# Patient Record
Sex: Male | Born: 1935 | Race: Black or African American | Hispanic: No | Smoking: Current every day smoker
Health system: Southern US, Community
[De-identification: ages and names within clinical notes are randomized; demographics above are authoritative.]

## PROBLEM LIST (undated history)

## (undated) DIAGNOSIS — I10 Essential (primary) hypertension: Secondary | ICD-10-CM

## (undated) DIAGNOSIS — D649 Anemia, unspecified: Secondary | ICD-10-CM

## (undated) DIAGNOSIS — E7800 Pure hypercholesterolemia, unspecified: Secondary | ICD-10-CM

## (undated) DIAGNOSIS — I509 Heart failure, unspecified: Secondary | ICD-10-CM

## (undated) DIAGNOSIS — N400 Enlarged prostate without lower urinary tract symptoms: Secondary | ICD-10-CM

## (undated) DIAGNOSIS — J449 Chronic obstructive pulmonary disease, unspecified: Secondary | ICD-10-CM

## (undated) HISTORY — PX: OTHER SURGICAL HISTORY: SHX169

## (undated) HISTORY — DX: Heart failure, unspecified: I50.9

---

## 2006-05-27 ENCOUNTER — Ambulatory Visit: Payer: Self-pay | Admitting: Surgery

## 2006-08-31 ENCOUNTER — Ambulatory Visit: Payer: Self-pay | Admitting: Ophthalmology

## 2006-08-31 ENCOUNTER — Other Ambulatory Visit: Payer: Self-pay

## 2013-10-05 ENCOUNTER — Inpatient Hospital Stay: Payer: Self-pay | Admitting: Internal Medicine

## 2013-10-05 LAB — CK TOTAL AND CKMB (NOT AT ARMC)
CK, TOTAL: 85 U/L
CK-MB: 0.9 ng/mL (ref 0.5–3.6)

## 2013-10-05 LAB — URINALYSIS, COMPLETE
Bilirubin,UR: NEGATIVE
Glucose,UR: NEGATIVE mg/dL (ref 0–75)
NITRITE: POSITIVE
Ph: 5 (ref 4.5–8.0)
RBC,UR: 12 /HPF (ref 0–5)
SPECIFIC GRAVITY: 1.017 (ref 1.003–1.030)
Squamous Epithelial: <1
WBC UR: 616 /HPF (ref 0–5)

## 2013-10-05 LAB — RETICULOCYTES
Absolute Retic Count: 0.0727 10*6/uL (ref 0.019–0.186)
Reticulocyte: 1.82 % (ref 0.4–3.1)

## 2013-10-05 LAB — CBC WITH DIFFERENTIAL/PLATELET
BASOS ABS: 0.1 10*3/uL (ref 0.0–0.1)
Basophil %: 0.3 %
EOS PCT: 0 %
Eosinophil #: 0 10*3/uL (ref 0.0–0.7)
HCT: 29.8 % — ABNORMAL LOW (ref 40.0–52.0)
HGB: 9.2 g/dL — AB (ref 13.0–18.0)
Lymphocyte #: 1.9 10*3/uL (ref 1.0–3.6)
Lymphocyte %: 9.7 %
MCH: 22.9 pg — ABNORMAL LOW (ref 26.0–34.0)
MCHC: 30.8 g/dL — ABNORMAL LOW (ref 32.0–36.0)
MCV: 74 fL — ABNORMAL LOW (ref 80–100)
Monocyte #: 0.9 x10 3/mm (ref 0.2–1.0)
Monocyte %: 4.4 %
Neutrophil #: 17.1 10*3/uL — ABNORMAL HIGH (ref 1.4–6.5)
Neutrophil %: 85.6 %
Platelet: 221 10*3/uL (ref 150–440)
RBC: 4.01 10*6/uL — ABNORMAL LOW (ref 4.40–5.90)
RDW: 24.1 % — AB (ref 11.5–14.5)
WBC: 20 10*3/uL — ABNORMAL HIGH (ref 3.8–10.6)

## 2013-10-05 LAB — COMPREHENSIVE METABOLIC PANEL
ALBUMIN: 3.1 g/dL — AB (ref 3.4–5.0)
Alkaline Phosphatase: 104 U/L
Anion Gap: 10 (ref 7–16)
BILIRUBIN TOTAL: 0.4 mg/dL (ref 0.2–1.0)
BUN: 10 mg/dL (ref 7–18)
CALCIUM: 8.8 mg/dL (ref 8.5–10.1)
CO2: 24 mmol/L (ref 21–32)
Chloride: 105 mmol/L (ref 98–107)
Creatinine: 0.82 mg/dL (ref 0.60–1.30)
EGFR (Non-African Amer.): >60
GLUCOSE: 162 mg/dL — AB (ref 65–99)
Osmolality: 280 (ref 275–301)
POTASSIUM: 3.4 mmol/L — AB (ref 3.5–5.1)
SGOT(AST): 21 U/L (ref 15–37)
SGPT (ALT): 15 U/L
Sodium: 139 mmol/L (ref 136–145)
Total Protein: 7.8 g/dL (ref 6.4–8.2)

## 2013-10-05 LAB — IRON AND TIBC
Iron Bind.Cap.(Total): 302 ug/dL (ref 250–450)
Iron Saturation: 5 %
Iron: 15 ug/dL — ABNORMAL LOW (ref 65–175)
Unbound Iron-Bind.Cap.: 287 ug/dL

## 2013-10-05 LAB — TROPONIN I: Troponin-I: <0.02 ng/mL

## 2013-10-05 LAB — FERRITIN: Ferritin (ARMC): 66 ng/mL (ref 8–388)

## 2013-10-05 LAB — PROTIME-INR
INR: 1
Prothrombin Time: 13.1 secs (ref 11.5–14.7)

## 2013-10-06 LAB — CBC WITH DIFFERENTIAL/PLATELET
BASOS PCT: 0.2 %
Basophil #: 0 10*3/uL (ref 0.0–0.1)
EOS ABS: 0 10*3/uL (ref 0.0–0.7)
Eosinophil %: 0.1 %
HCT: 27 % — AB (ref 40.0–52.0)
HGB: 8.4 g/dL — ABNORMAL LOW (ref 13.0–18.0)
LYMPHS ABS: 1.3 10*3/uL (ref 1.0–3.6)
Lymphocyte %: 6.5 %
MCH: 23.2 pg — AB (ref 26.0–34.0)
MCHC: 31.1 g/dL — AB (ref 32.0–36.0)
MCV: 75 fL — ABNORMAL LOW (ref 80–100)
MONOS PCT: 5.5 %
Monocyte #: 1.1 x10 3/mm — ABNORMAL HIGH (ref 0.2–1.0)
Neutrophil #: 17.7 10*3/uL — ABNORMAL HIGH (ref 1.4–6.5)
Neutrophil %: 87.7 %
Platelet: 220 10*3/uL (ref 150–440)
RBC: 3.62 10*6/uL — AB (ref 4.40–5.90)
RDW: 22.7 % — AB (ref 11.5–14.5)
WBC: 20.2 10*3/uL — AB (ref 3.8–10.6)

## 2013-10-06 LAB — PSA: PSA: <0.1 ng/mL (ref 0.0–4.0)

## 2013-10-07 LAB — CBC WITH DIFFERENTIAL/PLATELET
Basophil #: 0.1 10*3/uL (ref 0.0–0.1)
Basophil %: 0.4 %
Eosinophil #: 0 10*3/uL (ref 0.0–0.7)
Eosinophil %: 0.1 %
HCT: 27.1 % — ABNORMAL LOW (ref 40.0–52.0)
HGB: 8.5 g/dL — ABNORMAL LOW (ref 13.0–18.0)
Lymphocyte #: 1.9 10*3/uL (ref 1.0–3.6)
Lymphocyte %: 11.8 %
MCH: 23.2 pg — ABNORMAL LOW (ref 26.0–34.0)
MCHC: 31.4 g/dL — ABNORMAL LOW (ref 32.0–36.0)
MCV: 74 fL — AB (ref 80–100)
MONO ABS: 1.1 x10 3/mm — AB (ref 0.2–1.0)
MONOS PCT: 6.9 %
NEUTROS ABS: 13 10*3/uL — AB (ref 1.4–6.5)
Neutrophil %: 80.8 %
Platelet: 224 10*3/uL (ref 150–440)
RBC: 3.66 10*6/uL — ABNORMAL LOW (ref 4.40–5.90)
RDW: 23 % — ABNORMAL HIGH (ref 11.5–14.5)
WBC: 16.1 10*3/uL — AB (ref 3.8–10.6)

## 2013-10-07 LAB — URINE CULTURE

## 2013-10-10 LAB — CULTURE, BLOOD (SINGLE)

## 2014-04-07 ENCOUNTER — Emergency Department: Payer: Self-pay | Admitting: Emergency Medicine

## 2014-06-16 NOTE — Discharge Summary (Signed)
PATIENT NAME:  Douglas Mathis, Douglas Mathis MR#:  [id number] DATE OF BIRTH:  1935/10/28  DATE OF ADMISSION:  10/05/2013 DATE OF DISCHARGE:  10/07/2013  ADMISSION DIAGNOSES:  1.  Sepsis. 2.  Urinary tract infection discharge.  3.  Sepsis from urinary tract infection, resolved.  4.  Escherichia coli urinary tract infection.  5.  Iron-deficiency anemia.  6.  Conjunctivitis.   PERTINENT LABORATORIES AT DISCHARGE: White blood cells 16, hemoglobin 8.5, hematocrit 28 and platelets are 224,000.   Urine culture was positive for Escherichia coli.   Blood cultures -8.   CT of the head showed no acute intracranial hemorrhage or CVA.   TIBC 302, iron saturation 5, serum iron is 15.   HOSPITAL COURSE: A 79 year old male who presented actually with dizziness 10/05/2013, found to have sepsis and UTI. For further details, please refer to H and P.   1.  Sepsis.  The patient initially presented with fevers and leukocytosis secondary to sepsis from urinary tract infection. He was treated with antibiotics.  His urinary tract infection is outlined below.  2.  Urinary tract infection. This is positive for urine culture for Escherichia coli. He was started on Rocephin and will be discharged with p.o. antibiotics. He will need a complete course of 14 days of antibiotics.  3.  Iron-deficiency anemia. The patient will need outpatient followup.  4. Conjunctivitis of his left side. The patient was placed on eyedrops and has a followup next week with his ophthalmologist.  5. Tobacco dependence. The patient was counseled for 4 minutes regarding stopping smoking. He does want a nicotine patch at discharge.   DISCHARGE MEDICATIONS: 1.  Clonidine 0.3 mg b.i.d.  2.  Metoprolol 200 mg daily.  3.  Norvasc/benazepril 10/20 daily.  4.  Nicotine patch 7 mg per 24 hours.  5.  Ciprofloxacin eye drops 2 drops affected eye x6 days.  6.  Ciprofloxacin 500 mg p.o. b.i.d. x10 days.   DISCHARGE INSTRUCTIONS:  Home health, physical  therapy, nurse and nurse aide.   DISCHARGE DIET: Low sodium.  ACTIVITY:  As tolerated.   FOLLOWUP:  The patient will follow up with his primary care physician in 1 week as well as his ophthalmologist.   TIME SPENT: 35 minutes.    ____________________________ Janyth Contes. Juliene Pina, MD spm:DT D: 10/07/2013 11:20:01 ET T: 10/07/2013 15:32:17 ET JOB#: [id number]  cc: Kalina Morabito P. Juliene Pina, MD, <Dictator> Phineas Real [facility] Asa Fath P Megumi Treaster MD ELECTRONICALLY SIGNED 10/07/2013 21:23

## 2014-06-16 NOTE — H&P (Signed)
PATIENT NAME:  Douglas Mathis, Douglas Mathis MR#:  [id number] DATE OF BIRTH:  1935/12/13  DATE OF ADMISSION:  10/05/2013  REFERRING PHYSICIAN: Dr. Bayard Males  PRIMARY CARE PHYSICIAN: Nonlocal ([facility])   ADMIT DIAGNOSES: Sepsis, urinary tract infection, acute conjunctivitis.  HISTORY OF PRESENT ILLNESS: This is a 79 year old African American male who presents to the Emergency Department because of dizziness. The patient denies falling or having any loss of consciousness. He denies nausea, diaphoresis, chest pain or shortness of breath when he became dizzy. This is the first time he has had this sensation and it occurred as he went to the restroom in the middle of the night to empty his bladder. He states that this is his usual habit and that he has not had increased frequency or urgency to urinate. He denies having difficulty initiating his urinary stream, although admits that it occasionally happens. He denies having to strain in order to urinate. He has been moving his bowels regularly and denies feeling feverish.   REVIEW OF SYSTEMS: CONSTITUTIONAL: Denies fever or fatigue.  EYES: The patient complains of itching, pain and swelling of his left eye. ENT: The patient denies ear pain or sore throat.  RESPIRATORY: The patient denies cough, wheezing or shortness of breath.  CARDIOVASCULAR: The patient denies chest pain or palpitations. GASTROINTESTINAL: The patient denies nausea, vomiting and diarrhea.  GENITOURINARY: The patient denies dysuria, frequency, or hesitancy. He also denies incontinence.  ENDOCRINE: The patient denies polyuria, but admits to nocturia once per night.  HEMATOLOGIC AND LYMPHATIC: The patient denies easy bruising or any bleeding.  INTEGUMENTARY: The patient denies rash or lesions.  MUSCULOSKELETAL: The patient denies arthralgias or myalgias.  NEUROLOGIC: The patient denies numbness or weakness. He admits to dizziness, but denies vertigo or disequilibrium.  PSYCHIATRIC: The  patient denies suicidal ideation or anxiety.   PAST MEDICAL HISTORY: Significant for hypertension.  PAST SURGICAL HISTORY: The patient has had right eye cataract removal.  SOCIAL HISTORY: The patient lives alone. He denies drug and alcohol use, but admits to smoking 1/2 pack of cigarettes per day for at least 30 years.   MEDICATIONS:  1.  Metoprolol succinate 200 mg 1 tab p.o. daily. 2.  Clonidine 0.3 mg 1 tab p.o. b.i.d.  3.  Amlodipine/benazepril 10 mg/20 mg combination 1 tab p.o. daily.   ALLERGIES: No known drug allergies.   PERTINENT LABORATORY RESULTS AND RADIOLOGIC FINDINGS: Hemoglobin 9.2, hematocrit 29.8, white blood cell count 20, MCV 74. Potassium 3.4.   Chest x-ray shows hyperinflation consistent with COPD.  CT of the head shows no acute abnormality.   PHYSICAL EXAMINATION: VITAL SIGNS: Temperature 98.2, pulse 96, respirations 24, blood pressure 173/63.  GENERAL: The patient is alert and oriented x3, in no apparent distress.  HEENT: Normocephalic, atraumatic. Extraocular movements intact. Pupils equal, round and reactive to light. Left sclera with chemosis. There is no proptosis or pain with eye-movement. Oropharynx is without exudate or erythema. Mucous membranes are moist.  NECK: Trachea is midline. No adenopathy.  CHEST: Symmetric and atraumatic.  CARDIOVASCULAR: Regular rate and rhythm. Normal S1, S2. No rubs, clicks or murmurs. LUNGS: Clear to auscultation bilaterally. Normal effort and excursion.  ABDOMEN: Positive bowel sounds, soft, nontender, nondistended. No hepatosplenomegaly.  GENITOURINARY: The patient is uncircumcised.  MUSCULOSKELETAL: The patient moves all 4 extremities equally.  SKIN: No rashes or lesions.  EXTREMITIES: No clubbing, cyanosis or edema.  NEUROLOGIC: Cranial nerves II through XII are grossly intact.  PSYCHIATRIC: Mood is normal. Affect is congruent.   ASSESSMENT  AND PLAN: This is a 79 year old male with sepsis likely secondary to urinary  tract infection. He also has an acute conjunctivitis.  1.  Sepsis. The patient is currently afebrile. He is nontoxic appearing. His complaints of dizziness or lightheadedness may have been due to some orthostasis, but the patient is hemodynamically stable now. We will obtain blood cultures and urine cultures and start the patient on antibiotics.  2.  Urinary tract infection, complicated. The patient has received a dose of ceftriaxone in the Emergency Department. We will continue ceftriaxone and vancomycin until blood cultures are clear. He states he has not had a urinary tract infection in the past and the presenting scenario gives rise for concern for possible urinary retention and hesitancy due to prostatic hypertrophy. I have ordered a prostate-specific antigen. I have deferred digital rectal at this time.  3.  Acute conjunctivitis. The patient has edema of the sclera, but no evidence of postseptal cellulitis. We will continue ciprofloxacin ophthalmic drops.  4.  Microcytic anemia. I have ordered iron studies with the patient's morning labs.  5.  Deep vein thrombosis prophylaxis. Sequential compression devices.  6.  Gastrointestinal prophylaxis. The patient is not critically ill and so this is not necessary at this time.   TIME SPENT ON ADMISSION ORDERS AND PATIENT CARE: Approximately 35 minutes. ____________________________ Kelton Pillar. Sheryle Hail, MD msd:sb D: 10/05/2013 07:00:11 ET T: 10/05/2013 09:13:48 ET JOB#: [id number]  cc: Kelton Pillar. Sheryle Hail, MD, <Dictator> Kelton Pillar Eulas Schweitzer MD ELECTRONICALLY SIGNED 10/07/2013 2:57

## 2015-07-08 ENCOUNTER — Encounter: Payer: Self-pay | Admitting: *Deleted

## 2015-07-08 ENCOUNTER — Emergency Department: Payer: Medicare Other

## 2015-07-08 ENCOUNTER — Inpatient Hospital Stay
Admission: EM | Admit: 2015-07-08 | Discharge: 2015-07-11 | DRG: 378 | Disposition: A | Discharge status: Home / Self Care | Payer: Medicare Other | Attending: Internal Medicine | Admitting: Internal Medicine

## 2015-07-08 DIAGNOSIS — R195 Other fecal abnormalities: Secondary | ICD-10-CM | POA: Diagnosis present

## 2015-07-08 DIAGNOSIS — E7800 Pure hypercholesterolemia, unspecified: Secondary | ICD-10-CM | POA: Diagnosis present

## 2015-07-08 DIAGNOSIS — D500 Iron deficiency anemia secondary to blood loss (chronic): Secondary | ICD-10-CM | POA: Diagnosis present

## 2015-07-08 DIAGNOSIS — E871 Hypo-osmolality and hyponatremia: Secondary | ICD-10-CM | POA: Diagnosis present

## 2015-07-08 DIAGNOSIS — D649 Anemia, unspecified: Secondary | ICD-10-CM

## 2015-07-08 DIAGNOSIS — E876 Hypokalemia: Secondary | ICD-10-CM | POA: Diagnosis present

## 2015-07-08 DIAGNOSIS — Z79899 Other long term (current) drug therapy: Secondary | ICD-10-CM

## 2015-07-08 DIAGNOSIS — R531 Weakness: Secondary | ICD-10-CM

## 2015-07-08 DIAGNOSIS — E785 Hyperlipidemia, unspecified: Secondary | ICD-10-CM | POA: Diagnosis present

## 2015-07-08 DIAGNOSIS — K210 Gastro-esophageal reflux disease with esophagitis: Secondary | ICD-10-CM | POA: Diagnosis present

## 2015-07-08 DIAGNOSIS — K259 Gastric ulcer, unspecified as acute or chronic, without hemorrhage or perforation: Secondary | ICD-10-CM | POA: Diagnosis present

## 2015-07-08 DIAGNOSIS — N400 Enlarged prostate without lower urinary tract symptoms: Secondary | ICD-10-CM | POA: Diagnosis present

## 2015-07-08 DIAGNOSIS — F17210 Nicotine dependence, cigarettes, uncomplicated: Secondary | ICD-10-CM | POA: Diagnosis present

## 2015-07-08 DIAGNOSIS — Z808 Family history of malignant neoplasm of other organs or systems: Secondary | ICD-10-CM | POA: Diagnosis not present

## 2015-07-08 DIAGNOSIS — Z7982 Long term (current) use of aspirin: Secondary | ICD-10-CM

## 2015-07-08 DIAGNOSIS — K2960 Other gastritis without bleeding: Secondary | ICD-10-CM | POA: Diagnosis present

## 2015-07-08 DIAGNOSIS — Z833 Family history of diabetes mellitus: Secondary | ICD-10-CM | POA: Diagnosis not present

## 2015-07-08 DIAGNOSIS — I10 Essential (primary) hypertension: Secondary | ICD-10-CM | POA: Diagnosis present

## 2015-07-08 DIAGNOSIS — K922 Gastrointestinal hemorrhage, unspecified: Secondary | ICD-10-CM

## 2015-07-08 DIAGNOSIS — M109 Gout, unspecified: Secondary | ICD-10-CM | POA: Diagnosis present

## 2015-07-08 DIAGNOSIS — K264 Chronic or unspecified duodenal ulcer with hemorrhage: Principal | ICD-10-CM | POA: Diagnosis present

## 2015-07-08 DIAGNOSIS — H409 Unspecified glaucoma: Secondary | ICD-10-CM | POA: Diagnosis present

## 2015-07-08 HISTORY — DX: Essential (primary) hypertension: I10

## 2015-07-08 HISTORY — DX: Pure hypercholesterolemia, unspecified: E78.00

## 2015-07-08 LAB — ABO/RH: ABO/RH(D): O POS

## 2015-07-08 LAB — HEPATIC FUNCTION PANEL
ALK PHOS: 62 U/L (ref 38–126)
ALT: 9 U/L — AB (ref 17–63)
AST: 16 U/L (ref 15–41)
Albumin: 3.2 g/dL — ABNORMAL LOW (ref 3.5–5.0)
BILIRUBIN DIRECT: 0.1 mg/dL (ref 0.1–0.5)
BILIRUBIN INDIRECT: 0.2 mg/dL — AB (ref 0.3–0.9)
TOTAL PROTEIN: 6.5 g/dL (ref 6.5–8.1)
Total Bilirubin: 0.3 mg/dL (ref 0.3–1.2)

## 2015-07-08 LAB — BASIC METABOLIC PANEL
Anion gap: 8 (ref 5–15)
BUN: 10 mg/dL (ref 6–20)
CHLORIDE: 93 mmol/L — AB (ref 101–111)
CO2: 24 mmol/L (ref 22–32)
CREATININE: 0.73 mg/dL (ref 0.61–1.24)
Calcium: 8.7 mg/dL — ABNORMAL LOW (ref 8.9–10.3)
GFR calc Af Amer: >60 mL/min (ref >60)
GFR calc non Af Amer: >60 mL/min (ref >60)
GLUCOSE: 132 mg/dL — AB (ref 65–99)
Potassium: 4.7 mmol/L (ref 3.5–5.1)
Sodium: 125 mmol/L — ABNORMAL LOW (ref 135–145)

## 2015-07-08 LAB — CBC
HCT: 17.9 % — ABNORMAL LOW (ref 40.0–52.0)
Hemoglobin: 5.4 g/dL — ABNORMAL LOW (ref 13.0–18.0)
MCH: 16.8 pg — AB (ref 26.0–34.0)
MCHC: 30.2 g/dL — ABNORMAL LOW (ref 32.0–36.0)
MCV: 55.7 fL — AB (ref 80.0–100.0)
PLATELETS: 495 10*3/uL — AB (ref 150–440)
RBC: 3.21 MIL/uL — ABNORMAL LOW (ref 4.40–5.90)
RDW: 24.3 % — AB (ref 11.5–14.5)
WBC: 13.9 10*3/uL — ABNORMAL HIGH (ref 3.8–10.6)

## 2015-07-08 LAB — PROTIME-INR
INR: 1.09
Prothrombin Time: 14.3 seconds (ref 11.4–15.0)

## 2015-07-08 LAB — PREPARE RBC (CROSSMATCH)

## 2015-07-08 LAB — ETHANOL: Alcohol, Ethyl (B): <5 mg/dL (ref <5)

## 2015-07-08 LAB — BRAIN NATRIURETIC PEPTIDE: B Natriuretic Peptide: 108 pg/mL — ABNORMAL HIGH (ref 0.0–100.0)

## 2015-07-08 LAB — HEMOGLOBIN: HEMOGLOBIN: 6.5 g/dL — AB (ref 13.0–18.0)

## 2015-07-08 LAB — TROPONIN I: Troponin I: <0.03 ng/mL (ref <0.031)

## 2015-07-08 MED ORDER — SODIUM CHLORIDE 0.9 % IV SOLN
INTRAVENOUS | Status: DC
Start: 2015-07-08 — End: 2015-07-10
  Administered 2015-07-09: 50 mL/h via INTRAVENOUS

## 2015-07-08 MED ORDER — SODIUM CHLORIDE 0.9 % IV SOLN
10.0000 mL/h | Freq: Once | INTRAVENOUS | Status: AC
  Administered 2015-07-10: 10 mL/h via INTRAVENOUS

## 2015-07-08 MED ORDER — AMLODIPINE BESYLATE 10 MG PO TABS
10.0000 mg | ORAL_TABLET | Freq: Every day | ORAL | Status: DC
  Administered 2015-07-08 – 2015-07-11 (×3): 10 mg via ORAL
  Filled 2015-07-08 (×4): qty 1

## 2015-07-08 MED ORDER — PANTOPRAZOLE SODIUM 40 MG IV SOLR
40.0000 mg | Freq: Two times a day (BID) | INTRAVENOUS | Status: DC
Start: 2015-07-08 — End: 2015-07-11
  Administered 2015-07-08 – 2015-07-10 (×4): 40 mg via INTRAVENOUS
  Filled 2015-07-08 (×5): qty 40

## 2015-07-08 MED ORDER — TAMSULOSIN HCL 0.4 MG PO CAPS
0.4000 mg | ORAL_CAPSULE | Freq: Every day | ORAL | Status: DC
  Administered 2015-07-09 – 2015-07-11 (×2): 0.4 mg via ORAL
  Filled 2015-07-08 (×3): qty 1

## 2015-07-08 MED ORDER — LATANOPROST 0.005 % OP SOLN
1.0000 [drp] | Freq: Every day | OPHTHALMIC | Status: DC
  Administered 2015-07-08 – 2015-07-10 (×3): 1 [drp] via OPHTHALMIC
  Filled 2015-07-08 (×2): qty 2.5

## 2015-07-08 MED ORDER — BENAZEPRIL HCL 20 MG PO TABS
20.0000 mg | ORAL_TABLET | Freq: Every day | ORAL | Status: DC
  Administered 2015-07-08 – 2015-07-11 (×3): 20 mg via ORAL
  Filled 2015-07-08 (×4): qty 1

## 2015-07-08 MED ORDER — AMLODIPINE BESY-BENAZEPRIL HCL 10-20 MG PO CAPS: 1.0000 | ORAL_CAPSULE | Freq: Every day | ORAL | Status: DC

## 2015-07-08 MED ORDER — COLCHICINE 0.6 MG PO TABS
0.6000 mg | ORAL_TABLET | Freq: Every day | ORAL | Status: DC
  Administered 2015-07-08 – 2015-07-11 (×3): 0.6 mg via ORAL
  Filled 2015-07-08 (×4): qty 1

## 2015-07-08 MED ORDER — SODIUM CHLORIDE 0.9 % IV SOLN
Freq: Once | INTRAVENOUS | Status: AC
  Administered 2015-07-08: 17:00:00 via INTRAVENOUS

## 2015-07-08 NOTE — ED Notes (Signed)
Pt brought in via ems from [facility] with feeling weak and congestion.  No cough.  No chest pain.  No sob.  No abd pain.  No n/v/d.  Pt alert.  Speech clear.  Pt ate today, but states i just feel weak and tired.

## 2015-07-08 NOTE — ED Notes (Signed)
Iv inflitrated on lac.  Restarted rac 20gauge.  Iv fluids infusing.  Pt alert.  nsr on monitor.

## 2015-07-08 NOTE — ED Notes (Signed)
Consent signed for blood by patient

## 2015-07-08 NOTE — ED Notes (Signed)
Pt alert.  prbc's infusing.  Family with pt

## 2015-07-08 NOTE — ED Notes (Signed)
Family with pt.   Pt alert.  Family with pt.  nsr on monitor.

## 2015-07-08 NOTE — Progress Notes (Signed)
°   07/08/15 1900  Clinical Encounter Type  Visited With Patient and family together  Visit Type Initial  Referral From Nurse  Consult/Referral To Chaplain  Request for Advance Directives.  Patient's daughter Corrie Dandy -  Works evenings in L&D at [employer]) asked that we complete AD papers when other family members are present. She will contact pastoral care when ready. Chaplain [employer] Ext [phone]

## 2015-07-08 NOTE — H&P (Signed)
[employer] [facility] [place] [facility]   PATIENT NAME: Douglas Mathis    MR#:  [id number]  DATE OF BIRTH:  [birth year]  DATE OF ADMISSION:  07/08/2015  PRIMARY CARE PHYSICIAN: No primary care provider on file.   REQUESTING/REFERRING PHYSICIAN: [place]  CHIEF COMPLAINT:   Chief Complaint  Patient presents with  . Fatigue    HISTORY OF PRESENT ILLNESS: Douglas Mathis  is a 80 y.o. male with a known history of  Htn, hypercholesterolemia- takes BC powder once every week or 2, for last 4-5 days he is not feeling much hungry and not eating good, and started feeling very weak so decided to come to emergency room. In ER he was noted to have hemoglobin less than 6 and guaiac was positive in the stool, so ER physician ordered. Of blood transfusion and called for admission to hospitalist team. Patient denies any major vomiting of blood or noticing any blood in the stool. Denies any acid reflux symptoms, he denies any epigastric pain, he denies any colonoscopy done with his primary care doctor in the last many years.  PAST MEDICAL HISTORY:   Past Medical History  Diagnosis Date  . Hypertension   . Hypercholesterolemia     PAST SURGICAL HISTORY: No past surgical history on file.  SOCIAL HISTORY:  Social History  Substance Use Topics  . Smoking status: Current Every Day Smoker -- 1.00 packs/day  . Smokeless tobacco: Not on file  . Alcohol Use: No    FAMILY HISTORY:  Family History  Problem Relation Age of Onset  . Diabetes Mother   . Bone cancer Brother     DRUG ALLERGIES: No Known Allergies  REVIEW OF SYSTEMS:   CONSTITUTIONAL: No fever,Positive for fatigue or weakness.  EYES: No blurred or double vision.  EARS, NOSE, AND THROAT: No tinnitus or ear pain.  RESPIRATORY: No cough, shortness of breath, wheezing or hemoptysis.  CARDIOVASCULAR: No chest pain, orthopnea, edema.  GASTROINTESTINAL: No nausea, vomiting, diarrhea or abdominal pain.  GENITOURINARY: No  dysuria, hematuria.  ENDOCRINE: No polyuria, nocturia,  HEMATOLOGY: No anemia, easy bruising or bleeding SKIN: No rash or lesion. MUSCULOSKELETAL: No joint pain or arthritis.   NEUROLOGIC: No tingling, numbness, weakness.  PSYCHIATRY: No anxiety or depression.   MEDICATIONS AT HOME:  Prior to Admission medications   Medication Sig Start Date End Date Taking? Authorizing Provider  amLODipine-benazepril (LOTREL) 10-20 MG capsule Take 1 capsule by mouth daily.   Yes Historical Provider, MD  Aspirin-Salicylamide-Caffeine (BC HEADACHE PO) Take 1 packet by mouth 2 (two) times daily as needed (for headaches).   Yes Historical Provider, MD  bimatoprost (LUMIGAN) 0.01 % SOLN Place 1 drop into both eyes at bedtime.   Yes Historical Provider, MD  cloNIDine (CATAPRES) 0.2 MG tablet Take 0.2 mg by mouth 2 (two) times daily.   Yes Historical Provider, MD  colchicine 0.6 MG tablet Take 0.6 mg by mouth daily.   Yes Historical Provider, MD  tamsulosin (FLOMAX) 0.4 MG CAPS capsule Take 0.4 mg by mouth daily after breakfast.    Yes Historical Provider, MD      PHYSICAL EXAMINATION:   VITAL SIGNS: Blood pressure 141/57, pulse 88, temperature 98.2 F (36.8 C), temperature source Oral, resp. rate 20, height  (1.676 m), weight 81.647 kg (180 lb), SpO2 95 %.  GENERAL:  80 y.o.-year-old patient lying in the bed with no acute distress.  EYES: Pupils equal, round, reactive to light and accommodation. No scleral icterus. Extraocular muscles intact. Conjunctiva  pale HEENT: Head atraumatic, normocephalic. Oropharynx and nasopharynx clear.  NECK:  Supple, no jugular venous distention. No thyroid enlargement, no tenderness.  LUNGS: Normal breath sounds bilaterally, no wheezing, rales,rhonchi or crepitation. No use of accessory muscles of respiration.  CARDIOVASCULAR: S1, S2 normal. No murmurs, rubs, or gallops.  ABDOMEN: Soft, nontender, nondistended. Bowel sounds present. No organomegaly or mass.  EXTREMITIES:  No pedal edema, cyanosis, or clubbing.  NEUROLOGIC: Cranial nerves II through XII are intact. Muscle strength 5/5 in all extremities. Sensation intact. Gait not checked.  PSYCHIATRIC: The patient is alert and oriented x 3.  SKIN: No obvious rash, lesion, or ulcer.   LABORATORY PANEL:   CBC  Recent Labs Lab 07/08/15 1539  WBC 13.9*  HGB 5.4*  HCT 17.9*  PLT 495*  MCV 55.7*  MCH 16.8*  MCHC 30.2*  RDW 24.3*   ------------------------------------------------------------------------------------------------------------------  Chemistries   Recent Labs Lab 07/08/15 1539  NA 125*  K 4.7  CL 93*  CO2 24  GLUCOSE 132*  BUN 10  CREATININE 0.73  CALCIUM 8.7*   ------------------------------------------------------------------------------------------------------------------ estimated creatinine clearance is 76.3 mL/min (by C-G formula based on Cr of 0.73). ------------------------------------------------------------------------------------------------------------------ No results for input(s): TSH, T4TOTAL, T3FREE, THYROIDAB in the last 72 hours.  Invalid input(s): FREET3   Coagulation profile No results for input(s): INR, PROTIME in the last 168 hours. ------------------------------------------------------------------------------------------------------------------- No results for input(s): DDIMER in the last 72 hours. -------------------------------------------------------------------------------------------------------------------  Cardiac Enzymes No results for input(s): CKMB, TROPONINI, MYOGLOBIN in the last 168 hours.  Invalid input(s): CK ------------------------------------------------------------------------------------------------------------------ Invalid input(s): POCBNP  ---------------------------------------------------------------------------------------------------------------  Urinalysis    Component Value Date/Time   COLORURINE Yellow 10/05/2013  0414   APPEARANCEUR Cloudy 10/05/2013 0414   LABSPEC 1.017 10/05/2013 0414   PHURINE 5.0 10/05/2013 0414   GLUCOSEU Negative 10/05/2013 0414   HGBUR 1+ 10/05/2013 0414   BILIRUBINUR Negative 10/05/2013 0414   KETONESUR Trace 10/05/2013 0414   PROTEINUR 30 mg/dL 56/21/3086 5784   NITRITE Positive 10/05/2013 0414   LEUKOCYTESUR 3+ 10/05/2013 0414     RADIOLOGY: Dg Chest 1 View  07/08/2015  CLINICAL DATA:  Weakness in congestion for 2 days. EXAM: CHEST 1 VIEW COMPARISON:  10/05/2013 FINDINGS: Atherosclerotic aortic arch. Heart size within normal limits for projection. Emphysema. Chronically blunted left lateral costophrenic angle with thickening of the adjacent pleura. Thoracic spondylosis. IMPRESSION: 1. Stable blunting of the left lateral costophrenic angle potentially from scarring or chronic pleural effusion. 2. Atherosclerotic aortic arch. 3. Emphysema. Electronically Signed   By: Gaylyn Rong M.D.   On: 07/08/2015 17:07    EKG: Orders placed or performed during the hospital encounter of 07/08/15  . ED EKG  . ED EKG  . EKG 12-Lead  . EKG 12-Lead    IMPRESSION AND PLAN:  * Symptomatic anemia  Due to gastrointestinal bleed   Most likely upper GI bleed- due to peptic ulcer.   He takes BC powder almost every week.    2 units of blood transfusion ordered by ER.   Follow serial hemoglobin.   Keep on clear liquid diet for now.   IV Protonix twice a day.   GI consult for further management.  * Hypertension   Continue amlodipine and benazepril.  * Gout   Continue colchicine.  * Glaucoma   Continue eye drops.  * Smoking   Counseled to quit smoking for 4 minutes and offered nicotine patch.   All the records are reviewed and case discussed with ED provider. Management plans discussed with the patient, family  and they are in agreement.  CODE STATUS: DO NOT RESUSCITATE Code Status History    This patient does not have a recorded code status. Please follow your  organizational policy for patients in this situation.     Patient's daughter who works as a Water engineer in our hospital, was present during my visit. Patient would like her to be his healthcare power of attorney and he would like to be DO NOT RESUSCITATE in any adverse event.  TOTAL TIME TAKING CARE OF THIS PATIENT: 50 minutes.    Altamese Dilling M.D on 07/08/2015   Between 7am to 6pm - Pager - [phone]  After 6pm go to [email or web address] - Social research officer, government  [facility]  Office  [phone]  CC: Primary care physician; No primary care provider on file.   Note: This dictation was prepared with Dragon dictation along with smaller phrase technology. Any transcriptional errors that result from this process are unintentional.

## 2015-07-08 NOTE — ED Notes (Signed)
prbc's infusing at 55ml/hr.  nsr on monitor.

## 2015-07-08 NOTE — Progress Notes (Signed)
New Admission Note:   Arrival Method: per bed from ED with RN Amy Mental Orientation: alert and oriented X4 Telemetry: none ordered Assessment: Completed Skin: warm, dry, intact, no wounds noted IV: G20 on the right AC, patent with transparent dressing, first unit of blood infusing, started at the ED Pain: denies any pain as of this time Breathing: on room air, not in any distress Safety Measures: Safety Fall Prevention Plan has been given and discussed Admission: Completed 1A Orientation: Patient has been oriented to the room, unit and staff.  Family: brothers and sister at the bedside  Orders have been reviewed and implemented. Will continue to monitor the patient. Call light has been placed within reach and bed alarm has been activated.   Janice Norrie BSN, RN [facility] 1A

## 2015-07-08 NOTE — Progress Notes (Signed)
Pt complained of shortness of breath, denies any chest pain. Clear on both lungs per auscultation, O2 saturation 98% on room air, On call Dr Anne Hahn paged and made aware. Ordered for O2 inhalation 1-2liters/min. PRN for shortness of breath. Will administer and continue to monitor.

## 2015-07-08 NOTE — ED Notes (Signed)
Pt informed about reactions to let nurse know.  nsr on monitor.  Skin warm and dry.  Pt alert.

## 2015-07-08 NOTE — ED Notes (Signed)
Pt brought in via ems from [facility] with congestion and feeling weak from not eating.  No chest pain  Denies sob.  Pt alert.  Speech clear.

## 2015-07-08 NOTE — ED Notes (Addendum)
Blood transfusion infusing.  Pt alert. No reaction noted.

## 2015-07-08 NOTE — ED Provider Notes (Signed)
[facility] Emergency Department Provider Note        Time seen: ----------------------------------------- 4:30 PM on 07/08/2015 -----------------------------------------    I have reviewed the triage vital signs and the nursing notes.   HISTORY  Chief Complaint Fatigue    HPI Douglas Mathis is a 80 y.o. male brought the ER from the [facility] with weakness and congestion. Patient's tympanic cough or chest pain or shortness of breath. He denies nausea vomiting or diarrhea. Patient states he just feels weak and tired. Patient denies any blood in his stool, does admit to daily alcohol and cigarette use.   No past medical history on file.  There are no active problems to display for this patient.   No past surgical history on file.  Allergies Review of patient's allergies indicates no known allergies.  Social History Social History  Substance Use Topics  . Smoking status: Current Every Day Smoker  . Smokeless tobacco: None  . Alcohol Use: No    Review of Systems Constitutional: Negative for fever. Eyes: Negative for visual changes. ENT: Negative for sore throat. Cardiovascular: Negative for chest pain. Respiratory: Positive shortness of breath Gastrointestinal: Negative for abdominal pain, vomiting and diarrhea. Genitourinary: Negative for dysuria. Musculoskeletal: Negative for back pain. Skin: Negative for rash. Neurological:Positive for weakness  10-point ROS otherwise negative.  ____________________________________________   PHYSICAL EXAM:  VITAL SIGNS: ED Triage Vitals  Enc Vitals Group     BP 07/08/15 1530 145/55 mmHg     Pulse Rate 07/08/15 1530 86     Resp 07/08/15 1533 20     Temp 07/08/15 1533 98 F (36.7 C)     Temp Source 07/08/15 1533 Oral     SpO2 07/08/15 1530 97 %     Weight 07/08/15 1533 180 lb (81.647 kg)     Height 07/08/15 1533 5\' 6"  (1.676 m)     Head Cir --      Peak Flow --      Pain Score  --      Pain Loc --      Pain Edu? --      Excl. in GC? --     Constitutional: Alert and oriented. Mild distress Eyes: Conjunctivae are pale. PERRL. Normal extraocular movements. ENT   Head: Normocephalic and atraumatic.   Nose: No congestion/rhinnorhea.   Mouth/Throat: Mucous membranes are moist.   Neck: No stridor. Cardiovascular: Normal rate, regular rhythm. No murmurs, rubs, or gallops. Respiratory: Normal respiratory effort with mild tachypnea but no retractions. Breath sounds are clear and equal bilaterally. No wheezes/rales/rhonchi. Gastrointestinal: Soft and nontender. Normal bowel sounds Rectal: Soft, heme positive stool Musculoskeletal: Nontender with normal range of motion in all extremities. No lower extremity tenderness nor edema. Neurologic:  Normal speech and language. No gross focal neurologic deficits are appreciated.  Skin:  Skin is warm, dry and intact. Pallor is noted Psychiatric: Mood and affect are normal. Speech and behavior are normal.  ____________________________________________  EKG: Interpreted by me. Sinus rhythm with a rate of 82 bpm, first-degree AV block, wide QRS, normal QT interval. Right bundle branch block pattern, possible septal infarct age indeterminate  ____________________________________________  ED COURSE:  Pertinent labs & imaging results that were available during my care of the patient were reviewed by me and considered in my medical decision making (see chart for details). Patient is to ER for weakness of unclear etiology. We'll check basic labs and reevaluate. ____________________________________________    LABS (pertinent positives/negatives)  Labs Reviewed  BASIC METABOLIC PANEL - Abnormal; Notable for the following:    Sodium 125 (*)    Chloride 93 (*)    Glucose, Bld 132 (*)    Calcium 8.7 (*)    All other components within normal limits  CBC - Abnormal; Notable for the following:    WBC 13.9 (*)    RBC 3.21  (*)    Hemoglobin 5.4 (*)    HCT 17.9 (*)    MCV 55.7 (*)    MCH 16.8 (*)    MCHC 30.2 (*)    RDW 24.3 (*)    Platelets 495 (*)    All other components within normal limits  URINALYSIS COMPLETEWITH MICROSCOPIC ([facility] ONLY)  TROPONIN I  PROTIME-INR  HEPATIC FUNCTION PANEL  ETHANOL  BRAIN NATRIURETIC PEPTIDE  TYPE AND SCREEN  PREPARE RBC (CROSSMATCH)  ABO/RH   CRITICAL CARE Performed by: Emily Filbert   Total critical care time: 30 minutes  Critical care time was exclusive of separately billable procedures and treating other patients.  Critical care was necessary to treat or prevent imminent or life-threatening deterioration.  Critical care was time spent personally by me on the following activities: development of treatment plan with patient and/or surrogate as well as nursing, discussions with consultants, evaluation of patient's response to treatment, examination of patient, obtaining history from patient or surrogate, ordering and performing treatments and interventions, ordering and review of laboratory studies, ordering and review of radiographic studies, pulse oximetry and re-evaluation of patient's condition.   RADIOLOGY  Chest x-ray IMPRESSION: 1. Stable blunting of the left lateral costophrenic angle potentially from scarring or chronic pleural effusion. 2. Atherosclerotic aortic arch. 3. Emphysema. ____________________________________________  FINAL ASSESSMENT AND PLAN  Weakness, anemia, hyponatremia, GI bleed  Plan: Patient with labs and imaging as dictated above. Patient presents to ER for weakness as was found to be profoundly anemic requiring a blood transfusion. He did have heme positive stool. Patient will require admission for further stabilization. He was also started on saline for hyponatremia.   Emily Filbert, MD   Note: This dictation was prepared with Dragon dictation. Any transcriptional errors that result from this process are  unintentional   Emily Filbert, MD 07/08/15 [phone]

## 2015-07-09 ENCOUNTER — Inpatient Hospital Stay: Payer: Medicare Other

## 2015-07-09 LAB — BASIC METABOLIC PANEL
Anion gap: 9 (ref 5–15)
Anion gap: 11 (ref 5–15)
BUN: 7 mg/dL (ref 6–20)
BUN: 8 mg/dL (ref 6–20)
CALCIUM: 8.6 mg/dL — AB (ref 8.9–10.3)
CHLORIDE: 89 mmol/L — AB (ref 101–111)
CO2: 24 mmol/L (ref 22–32)
CO2: 23 mmol/L (ref 22–32)
CREATININE: 0.6 mg/dL — AB (ref 0.61–1.24)
Calcium: 8.6 mg/dL — ABNORMAL LOW (ref 8.9–10.3)
Chloride: 92 mmol/L — ABNORMAL LOW (ref 101–111)
Creatinine, Ser: 0.61 mg/dL (ref 0.61–1.24)
GFR calc Af Amer: >60 mL/min (ref >60)
GFR calc non Af Amer: >60 mL/min (ref >60)
GFR calc non Af Amer: >60 mL/min (ref >60)
GLUCOSE: 108 mg/dL — AB (ref 65–99)
Glucose, Bld: 105 mg/dL — ABNORMAL HIGH (ref 65–99)
POTASSIUM: 3.8 mmol/L (ref 3.5–5.1)
Potassium: 4 mmol/L (ref 3.5–5.1)
SODIUM: 125 mmol/L — AB (ref 135–145)
Sodium: 123 mmol/L — ABNORMAL LOW (ref 135–145)

## 2015-07-09 LAB — CBC
HCT: 24.7 % — ABNORMAL LOW (ref 40.0–52.0)
Hemoglobin: 7.6 g/dL — ABNORMAL LOW (ref 13.0–18.0)
MCH: 19.3 pg — AB (ref 26.0–34.0)
MCHC: 31 g/dL — AB (ref 32.0–36.0)
MCV: 62.4 fL — ABNORMAL LOW (ref 80.0–100.0)
PLATELETS: 435 10*3/uL (ref 150–440)
RBC: 3.95 MIL/uL — AB (ref 4.40–5.90)
RDW: 29.9 % — AB (ref 11.5–14.5)
WBC: 13.8 10*3/uL — ABNORMAL HIGH (ref 3.8–10.6)

## 2015-07-09 LAB — URINALYSIS COMPLETE WITH MICROSCOPIC (ARMC ONLY)
BILIRUBIN URINE: NEGATIVE
Bacteria, UA: NONE SEEN
Glucose, UA: NEGATIVE mg/dL
NITRITE: NEGATIVE
PH: 6 (ref 5.0–8.0)
Protein, ur: NEGATIVE mg/dL
Specific Gravity, Urine: 1.008 (ref 1.005–1.030)

## 2015-07-09 LAB — SODIUM, URINE, RANDOM: SODIUM UR: 100 mmol/L

## 2015-07-09 LAB — HEMOGLOBIN
HEMOGLOBIN: 7.4 g/dL — AB (ref 13.0–18.0)
Hemoglobin: 7.6 g/dL — ABNORMAL LOW (ref 13.0–18.0)

## 2015-07-09 LAB — MRSA PCR SCREENING: MRSA by PCR: NEGATIVE

## 2015-07-09 MED ORDER — SODIUM CHLORIDE 1 G PO TABS
1.0000 g | ORAL_TABLET | Freq: Three times a day (TID) | ORAL | Status: DC
  Administered 2015-07-09 – 2015-07-11 (×4): 1 g via ORAL
  Filled 2015-07-09 (×9): qty 1

## 2015-07-09 MED ORDER — ONDANSETRON HCL 4 MG/2ML IJ SOLN
4.0000 mg | Freq: Four times a day (QID) | INTRAMUSCULAR | Status: DC | PRN
  Administered 2015-07-09: 4 mg via INTRAVENOUS
  Filled 2015-07-09: qty 2

## 2015-07-09 MED ORDER — CLONIDINE HCL 0.1 MG PO TABS
0.1000 mg | ORAL_TABLET | Freq: Two times a day (BID) | ORAL | Status: DC
  Administered 2015-07-09 – 2015-07-11 (×3): 0.1 mg via ORAL
  Filled 2015-07-09 (×4): qty 1

## 2015-07-09 NOTE — Consult Note (Signed)
Patient with profound anemia with hgb of 5.4 up to 7.4after 2 units of blood.  BUN not elevated so not likely the anemia is  due to acute UGI bleeding.  He has hx of 1/2-1 ppd smoking, alcohol intake of numerous beverages and quit 6 months ago.  EKG done and no ischemic changes.  Takes infrequent BC.  Will schedule him for an EGD tomorrow morning.  If this is neg then schedule inpatient colonoscopy.

## 2015-07-09 NOTE — Progress Notes (Signed)
Pt began vomiting clear liquid, no blood seen. Paged MD. IV Zofran ordered per Dr. Renae Gloss.

## 2015-07-09 NOTE — Consult Note (Signed)
GI Inpatient Consult Note  Reason for Consult: GI bleed, anemia   Attending Requesting Consult: Dr. Elisabeth Mathis  History of Present Illness: Douglas Mathis is a 80 y.o. male with a history of HTN and HLD admitted with a GI bleed.  Patient presented to the [facility] ED with complaints of weakness, fatigue, and congestion x 4-5 days.  His appetite and oral intake were decreased during this time.  He endorsed taking BC powders about once per week for an uncertain amount of time.    VSS. Labs: Hgb 5.4, Hct 17.9, MCV 55.7, plts 495; MRSA negative Plan: admit, transfuse x 2, start IV Protonix 40mg  q 12 hrs After 1 unit, Hgb improved at 6.5.  Following a second unit, Hgb 7.6 this morning.  Today, Douglas Mathis reports his fatigue and weakness are gradually improving since receiving transfusions.  He did have an acute onset of nausea followed by clear, NBNB emesis. Patient denies nausea or vomiting prior to now. He denies epigastric pain, reflux symptoms, nausea, vomiting, and hematemesis.  BMs are usually soft, occuring daily, w/o hematochezia or melena.  However, he has not had a BM in a few days due to decreased oral intake.  Also no fever, chills, weight loss, h/o anemia, and previous GI bleed.  Other than BC powders, patient denies additional NSAID use. He does endorses daily cigarette smoking, but has abstained from EtOH for the last 6 months.  He recalls having a colonoscopy and EGD "a long time ago" - possibly 1990s.  Prior to these last few days, his weight was stable and appetite was good.  FHx is not significant for CCA, colon polyps, or other GI malignancy.  Past Medical History:  Past Medical History  Diagnosis Date  . Hypertension   . Hypercholesterolemia     Problem List: Patient Active Problem List   Diagnosis Date Noted  . GI bleed 07/08/2015    Past Surgical History: No past surgical history on file.  Allergies: No Known Allergies  Home Medications: Prescriptions prior to  admission  Medication Sig Dispense Refill Last Dose  . amLODipine-benazepril (LOTREL) 10-20 MG capsule Take 1 capsule by mouth daily.   07/08/2015 at Unknown time  . Aspirin-Salicylamide-Caffeine (BC HEADACHE PO) Take 1 packet by mouth 2 (two) times daily as needed (for headaches).   Past Week at Unknown time  . bimatoprost (LUMIGAN) 0.01 % SOLN Place 1 drop into both eyes at bedtime.   07/07/2015 at Unknown time  . cloNIDine (CATAPRES) 0.2 MG tablet Take 0.2 mg by mouth 2 (two) times daily.   07/08/2015 at Unknown time  . colchicine 0.6 MG tablet Take 0.6 mg by mouth daily.   07/08/2015 at Unknown time  . tamsulosin (FLOMAX) 0.4 MG CAPS capsule Take 0.4 mg by mouth daily after breakfast.    07/08/2015 at Unknown time   Home medication reconciliation was completed with the patient.   Scheduled Inpatient Medications:   . sodium chloride  10 mL/hr Intravenous Once  . amLODipine  10 mg Oral Daily   And  . benazepril  20 mg Oral Daily  . colchicine  0.6 mg Oral Daily  . latanoprost  1 drop Both Eyes QHS  . pantoprazole (PROTONIX) IV  40 mg Intravenous Q12H  . sodium chloride  1 g Oral TID WC  . tamsulosin  0.4 mg Oral QPC breakfast    Continuous Inpatient Infusions:   . sodium chloride 75 mL/hr (07/09/15 0354)    PRN Inpatient Medications:  Family History: family history includes Bone cancer in his brother; Diabetes in his mother.    Social History:   reports that he has been smoking.  He does not have any smokeless tobacco history on file. He reports that he does not drink alcohol.   Review of Systems: Constitutional: Weight is stable.  Eyes: No changes in vision. ENT: No oral lesions, sore throat.  GI: see HPI.  Heme/Lymph: No easy bruising.  CV: No chest pain.  GU: No hematuria.  Integumentary: No rashes.  Neuro: No headaches.  Psych: No depression/anxiety.  Endocrine: No heat/cold intolerance.  Allergic/Immunologic: No urticaria.  Resp: No cough, SOB.   Musculoskeletal: No joint swelling.    Physical Examination: BP 151/52 mmHg  Pulse 90  Temp(Src) 98.2 F (36.8 C) (Oral)  Resp 16  Ht  (1.676 m)  Wt 80.287 kg (177 lb)  BMI 28.58 kg/m2  SpO2 95% Gen: NAD, alert and oriented x 4 HEENT: PEERLA, EOMI, Neck: supple, no JVD or thyromegaly Chest: CTA bilaterally, no wheezes, crackles, or other adventitious sounds CV: RRR, no m/g/c/r Abd: soft, NT, ND, +BS in all four quadrants; no HSM, guarding, ridigity, or rebound tenderness Ext: no edema, well perfused with 2+ pulses, Skin: no rash or lesions noted Lymph: no LAD  Data: Lab Results  Component Value Date   WBC 13.8* 07/09/2015   HGB 7.4* 07/09/2015   HCT 24.7* 07/09/2015   MCV 62.4* 07/09/2015   PLT 435 07/09/2015    Recent Labs Lab 07/08/15 2325 07/09/15 0522 07/09/15 1129  HGB 6.5* 7.6* 7.4*   Lab Results  Component Value Date   NA 125* 07/09/2015   K 4.0 07/09/2015   CL 92* 07/09/2015   CO2 24 07/09/2015   BUN 7 07/09/2015   CREATININE 0.60* 07/09/2015   Lab Results  Component Value Date   ALT 9* 07/08/2015   AST 16 07/08/2015   ALKPHOS 62 07/08/2015   BILITOT 0.3 07/08/2015    Recent Labs Lab 07/08/15 1539  INR 1.09   Assessment/Plan: Douglas Mathis is a 80 y.o. male with a history of HTN and HLD admitted with a GI bleed.  He reported fatigue, weakness, and decreased appetite for the last 4-5 days.  In the ED, Hgb was found to be 4.5, improving to 7.6 after transfusing 2 units.  Patient does endorse taking BC powders about once per week for an uncertain amount of time.  His vital have remained stable, and there are no symptoms of overt bleeding.  Likely very slow UGI bleed, therefore recommend EGD to further evaluate.  Per Dr. Mechele Mathis, will plan for this tomorrow morning.  Patient may have clear liquids tonight, then NPO after midnight.  Recommendations: - Monitor Hgb, transfuse <7 - Continue IV Protonix  q 12hrs - Clear liquids tonight, then  NPO after midnight - Encouraged patient to avoid all NSAIDs and BC powders upon discharge - Further recs per Dr. Mechele Mathis  Thank you for the consult. We will follow along with you. Please call with questions or concerns.  Burman Freestone, PA-C [facility] Phone: [phone] Pager: [phone]

## 2015-07-09 NOTE — Care Management (Signed)
Met with patient and two daughters. He is from [facility] which is NOT an ALF. He is independent with mobility; drives. Her PCP is Dr. Brynda Greathouse. He denies difficulty obtaining Rx. No RNCM needs. Case closed.

## 2015-07-09 NOTE — Progress Notes (Signed)
Pt off floor for abd U/S. Per Dr. Markham Jordan, pt ok to have water only until midnight, then resume NPO. Sched for EGD tomorrow.

## 2015-07-09 NOTE — Progress Notes (Signed)
Patient ID: Douglas Mathis, male   DOB: [birth year], 80 y.o.   MRN: [id number] [employer] PROGRESS NOTE  Douglas Mathis:[id number] DOB: 08-07-1935 DOA: 07/08/2015 PCP: No primary care provider on file.  HPI/Subjective: Patient vomited a few times. Patient is a poor historian. In the ER, found to have low hemoglobin and was guaiac positive. Patient denies black stools or vomiting blood.  Objective: Filed Vitals:   07/09/15 0757 07/09/15 1146  BP: 140/56 151/52  Pulse: 71 90  Temp: 98.2 F (36.8 C) 98.2 F (36.8 C)  Resp:  16    Filed Weights   07/08/15 1533 07/08/15 2046  Weight: 81.647 kg (180 lb) 80.287 kg (177 lb)    ROS: Review of Systems  Constitutional: Negative for fever and chills.  Eyes: Negative for blurred vision.  Respiratory: Negative for cough and shortness of breath.   Cardiovascular: Negative for chest pain.  Gastrointestinal: Positive for vomiting. Negative for nausea, abdominal pain, diarrhea and constipation.  Genitourinary: Negative for dysuria.  Musculoskeletal: Negative for joint pain.  Neurological: Negative for dizziness and headaches.   Exam: Physical Exam  Constitutional: He is oriented to person, place, and time.  HENT:  Nose: No mucosal edema.  Mouth/Throat: No oropharyngeal exudate or posterior oropharyngeal edema.  Eyes: Conjunctivae, EOM and lids are normal. Pupils are equal, round, and reactive to light.  Neck: No JVD present. Carotid bruit is not present. No edema present. No thyroid mass and no thyromegaly present.  Cardiovascular: S1 normal and S2 normal.  Exam reveals no gallop.   No murmur heard. Pulses:      Dorsalis pedis pulses are 2+ on the right side, and 2+ on the left side.  Respiratory: No respiratory distress. He has no wheezes. He has no rhonchi. He has no rales.  GI: Soft. Bowel sounds are normal. There is no tenderness.  Musculoskeletal:       Right ankle: He exhibits no swelling.       Left ankle: He  exhibits no swelling.  Lymphadenopathy:    He has no cervical adenopathy.  Neurological: He is alert and oriented to person, place, and time. No cranial nerve deficit.  Skin: Skin is warm. No rash noted. Nails show no clubbing.  Psychiatric: He has a normal mood and affect.      Data Reviewed: Basic Metabolic Panel:  Recent Labs Lab 07/08/15 1539 07/09/15 0522 07/09/15 1343  NA 125* 125* 123*  K 4.7 4.0 3.8  CL 93* 92* 89*  CO2 GLUCOSE 132* 105* 108*  BUN CREATININE 0.73 0.60* 0.61  CALCIUM 8.7* 8.6* 8.6*   Liver Function Tests:  Recent Labs Lab 07/08/15 1539  AST 16  ALT 9*  ALKPHOS 62  BILITOT 0.3  PROT 6.5  ALBUMIN 3.2*   CBC:  Recent Labs Lab 07/08/15 1539 07/08/15 2325 07/09/15 0522 07/09/15 1129  WBC 13.9*  --  13.8*  --   HGB 5.4* 6.5* 7.6* 7.4*  HCT 17.9*  --  24.7*  --   MCV 55.7*  --  62.4*  --   PLT 495*  --  435  --    Cardiac Enzymes:  Recent Labs Lab 07/08/15 1539  TROPONINI <0.03   BNP (last 3 results)  Recent Labs  07/08/15 1539  BNP 108.0*     Recent Results (from the past 240 hour(s))  MRSA PCR Screening     Status: None   Collection Time: 07/09/15  2:03  AM  Result Value Ref Range Status   MRSA by PCR NEGATIVE NEGATIVE Final    Comment:        The GeneXpert MRSA Assay (FDA approved for NASAL specimens only), is one component of a comprehensive MRSA colonization surveillance program. It is not intended to diagnose MRSA infection nor to guide or monitor treatment for MRSA infections.      Studies: Dg Chest 1 View  07/08/2015  CLINICAL DATA:  Weakness in congestion for 2 days. EXAM: CHEST 1 VIEW COMPARISON:  10/05/2013 FINDINGS: Atherosclerotic aortic arch. Heart size within normal limits for projection. Emphysema. Chronically blunted left lateral costophrenic angle with thickening of the adjacent pleura. Thoracic spondylosis. IMPRESSION: 1. Stable blunting of the left lateral costophrenic angle  potentially from scarring or chronic pleural effusion. 2. Atherosclerotic aortic arch. 3. Emphysema. Electronically Signed   By: Gaylyn Rong M.D.   On: 07/08/2015 17:07    Scheduled Meds: . sodium chloride  10 mL/hr Intravenous Once  . amLODipine  10 mg Oral Daily   And  . benazepril  20 mg Oral Daily  . cloNIDine  0.1 mg Oral BID  . colchicine  0.6 mg Oral Daily  . latanoprost  1 drop Both Eyes QHS  . pantoprazole (PROTONIX) IV  40 mg Intravenous Q12H  . sodium chloride  1 g Oral TID WC  . tamsulosin  0.4 mg Oral QPC breakfast   Continuous Infusions: . sodium chloride 75 mL/hr (07/09/15 0354)    Assessment/Plan:  1. Severe microcytic anemia, likely chronic blood loss. Awaiting GI evaluation. On IV Protonix. Send off iron studies and B12. Looking back at old laboratory data looks like he's been anemic for a while. 2. Hyponatremia. Unclear etiology at this point. Serial BMPs. Send off urine random sodium. Nephrology consultation. Start sodium chloride tablets. Get ultrasound of the abdomen. Daughter states he drinks alcohol recently and some weight loss. 3. Essential hypertension. Continue usual medications 4. Glaucoma Unspecified on latanoprost 5. BPH unspecified on Flomax  Code Status:     Code Status Orders        Start     Ordered   07/08/15 2031  Do not attempt resuscitation (DNR)   Continuous    Question Answer Comment  In the event of cardiac or respiratory ARREST Do not call a "code blue"   In the event of cardiac or respiratory ARREST Do not perform Intubation, CPR, defibrillation or ACLS   In the event of cardiac or respiratory ARREST Use medication by any route, position, wound care, and other measures to relive pain and suffering. May use oxygen, suction and manual treatment of airway obstruction as needed for comfort.   Comments confirmed with pt      07/08/15 2030    Code Status History    Date Active Date Inactive Code Status Order ID Comments User  Context   This patient has a current code status but no historical code status.     Disposition Plan: To be determined, spoke with daughter on phone  Consultants:  Gastroenterology  Nephrology  Time spent: 25 minutes  Alford Highland  [employer]

## 2015-07-10 ENCOUNTER — Encounter
Admission: EM | Disposition: A | Discharge status: Home / Self Care | Payer: Self-pay | Source: Home / Self Care | Attending: Internal Medicine

## 2015-07-10 ENCOUNTER — Encounter: Payer: Self-pay | Admitting: *Deleted

## 2015-07-10 ENCOUNTER — Inpatient Hospital Stay: Payer: Medicare Other | Admitting: Anesthesiology

## 2015-07-10 HISTORY — PX: ESOPHAGOGASTRODUODENOSCOPY: SHX5428

## 2015-07-10 LAB — BASIC METABOLIC PANEL
ANION GAP: 8 (ref 5–15)
BUN: 8 mg/dL (ref 6–20)
CALCIUM: 8.6 mg/dL — AB (ref 8.9–10.3)
CO2: 26 mmol/L (ref 22–32)
Chloride: 95 mmol/L — ABNORMAL LOW (ref 101–111)
Creatinine, Ser: 0.64 mg/dL (ref 0.61–1.24)
Glucose, Bld: 85 mg/dL (ref 65–99)
POTASSIUM: 3.6 mmol/L (ref 3.5–5.1)
Sodium: 129 mmol/L — ABNORMAL LOW (ref 135–145)

## 2015-07-10 LAB — FERRITIN: Ferritin: 26 ng/mL (ref 24–336)

## 2015-07-10 LAB — SODIUM, URINE, RANDOM: Sodium, Ur: 101 mmol/L

## 2015-07-10 LAB — CHLORIDE, URINE, RANDOM: Chloride Urine: 118 mmol/L

## 2015-07-10 LAB — IRON AND TIBC
IRON: 8 ug/dL — AB (ref 45–182)
Saturation Ratios: 3 % — ABNORMAL LOW (ref 17.9–39.5)
TIBC: 323 ug/dL (ref 250–450)
UIBC: 315 ug/dL

## 2015-07-10 LAB — VITAMIN B12: VITAMIN B 12: 913 pg/mL (ref 180–914)

## 2015-07-10 LAB — OSMOLALITY, URINE: OSMOLALITY UR: 378 mosm/kg (ref 300–900)

## 2015-07-10 LAB — HEMOGLOBIN: Hemoglobin: 7.1 g/dL — ABNORMAL LOW (ref 13.0–18.0)

## 2015-07-10 SURGERY — EGD (ESOPHAGOGASTRODUODENOSCOPY)
Anesthesia: General

## 2015-07-10 MED ORDER — PROPOFOL 500 MG/50ML IV EMUL
INTRAVENOUS | Status: DC | PRN
  Administered 2015-07-10: 120 ug/kg/min via INTRAVENOUS

## 2015-07-10 MED ORDER — MIDAZOLAM HCL 2 MG/2ML IJ SOLN
INTRAMUSCULAR | Status: DC | PRN
  Administered 2015-07-10: 1 mg via INTRAVENOUS

## 2015-07-10 MED ORDER — FENTANYL CITRATE (PF) 100 MCG/2ML IJ SOLN
INTRAMUSCULAR | Status: DC | PRN
  Administered 2015-07-10: 50 ug via INTRAVENOUS

## 2015-07-10 MED ORDER — SODIUM CHLORIDE 0.9 % IV SOLN
400.0000 mg | Freq: Once | INTRAVENOUS | Status: AC
  Administered 2015-07-10: 400 mg via INTRAVENOUS
  Filled 2015-07-10: qty 20

## 2015-07-10 NOTE — Anesthesia Procedure Notes (Signed)
Performed by: COOK-MARTIN, Alwilda Gilland Pre-anesthesia Checklist: Patient identified, Emergency Drugs available, Suction available, Patient being monitored and Timeout performed Patient Re-evaluated:Patient Re-evaluated prior to inductionOxygen Delivery Method: Nasal cannula Preoxygenation: Pre-oxygenation with 100% oxygen Intubation Type: IV induction Airway Equipment and Method: Bite block Placement Confirmation: positive ETCO2 and CO2 detector     

## 2015-07-10 NOTE — Progress Notes (Signed)
Pt transported for EGD procedure. Family at bedside.

## 2015-07-10 NOTE — Op Note (Signed)
[facility] Patient Name: Key Cen Procedure Date: 07/10/2015 10:58 AM MRN: [id number] Account #: [id number] Date of Birth: 1935/12/11 Admit Type: Inpatient Age: 80 Room: [facility] ENDO ROOM 4 Gender: Male Note Status: Finalized Procedure:            Upper GI endoscopy Indications:          Heme positive stool, Unexplained iron deficiency anemia Providers:            Scot Jun, MD Referring MD:         Altamese Dilling (Referring MD) Medicines:            Propofol per Anesthesia Complications:        No immediate complications. Procedure:            Pre-Anesthesia Assessment:                       - After reviewing the risks and benefits, the patient                        was deemed in satisfactory condition to undergo the                        procedure.                       After obtaining informed consent, the endoscope was                        passed under direct vision. Throughout the procedure,                        the patient's blood pressure, pulse, and oxygen                        saturations were monitored continuously. The Endoscope                        was introduced through the mouth, and advanced to the                        second part of duodenum. The upper GI endoscopy was                        accomplished without difficulty. The patient tolerated                        the procedure well. Findings:      LA Grade A (one or more mucosal breaks less than 5 mm, not extending       between tops of 2 mucosal folds) esophagitis with no bleeding was found       42 cm from the incisors.      Diffuse and patchy moderate inflammation characterized by erosions,       granularity and shallow ulcerations was found in the gastric antrum.      One non-bleeding cratered duodenal ulcer with no stigmata of bleeding       was found in the duodenal bulb. The lesion was 10 mm in largest       dimension.      One  non-bleeding cratered duodenal ulcer with no stigmata of bleeding  was found in the second portion of the duodenum. The lesion was 4 mm in       largest dimension. Impression:           - LA Grade A reflux esophagitis.                       - Erosive Gastritis.                       - One non-bleeding duodenal ulcer with no stigmata of                        bleeding.                       - One non-bleeding duodenal ulcer with no stigmata of                        bleeding.                       - No specimens collected. Recommendation:       - Use Protonix (pantoprazole) 40 mg IV twice daily then                        switch to oral bid. Start clear liquid diet, carafate                        slurry. Follow hgb and transfuse if below 7. Scot Jun, MD 07/10/2015 11:18:32 AM This report has been signed electronically. Number of Addenda: 0 Note Initiated On: 07/10/2015 10:58 AM      [facility]

## 2015-07-10 NOTE — Anesthesia Postprocedure Evaluation (Signed)
Anesthesia Post Note  Patient: Douglas Mathis  Procedure(s) Performed: Procedure(s) (LRB): ESOPHAGOGASTRODUODENOSCOPY (EGD) (N/A)  Patient location during evaluation: Endoscopy Anesthesia Type: General Level of consciousness: awake and alert Pain management: pain level controlled Vital Signs Assessment: post-procedure vital signs reviewed and stable Respiratory status: spontaneous breathing, nonlabored ventilation, respiratory function stable and patient connected to nasal cannula oxygen Cardiovascular status: blood pressure returned to baseline and stable Postop Assessment: no signs of nausea or vomiting Anesthetic complications: no    Last Vitals:  Filed Vitals:   07/10/15 1517 07/10/15 1942  BP: 124/72 153/60  Pulse: 87 73  Temp: 36.8 C 37.1 C  Resp: 18 18    Last Pain: There were no vitals filed for this visit.               Taden Witter S

## 2015-07-10 NOTE — Transfer of Care (Signed)
Immediate Anesthesia Transfer of Care Note  Patient: Douglas Mathis  Procedure(s) Performed: Procedure(s): ESOPHAGOGASTRODUODENOSCOPY (EGD) (N/A)  Patient Location: PACU  Anesthesia Type:General  Level of Consciousness: awake and sedated  Airway & Oxygen Therapy: Patient Spontanous Breathing and Patient connected to nasal cannula oxygen  Post-op Assessment: Report given to RN and Post -op Vital signs reviewed and stable  Post vital signs: Reviewed and stable  Last Vitals:  Filed Vitals:   07/10/15 0722 07/10/15 0903  BP: 142/46 139/52  Pulse: 81   Temp: 36.7 C 36.9 C  Resp: 18 18    Last Pain: There were no vitals filed for this visit.       Complications: No apparent anesthesia complications

## 2015-07-10 NOTE — Consult Note (Signed)
Patient with large non bleeding duodenal ulcer in duodenal bulb. and smaller duodenal ulcer in second portion.  Also gastritis and erosions/superficial ulcers.  Start clear liquid diet and advance slowly.

## 2015-07-10 NOTE — Anesthesia Preprocedure Evaluation (Signed)
Anesthesia Evaluation  Patient identified by MRN, date of birth, ID band Patient awake    Reviewed: Allergy & Precautions, NPO status , Patient's Chart, lab work & pertinent test results, reviewed documented beta blocker date and time   Airway Mallampati: III  TM Distance: >3 FB     Dental  (+) Chipped, Upper Dentures, Lower Dentures   Pulmonary Current Smoker,           Cardiovascular hypertension, Pt. on medications      Neuro/Psych    GI/Hepatic   Endo/Other    Renal/GU      Musculoskeletal   Abdominal   Peds  Hematology   Anesthesia Other Findings Obese. Gout.  Reproductive/Obstetrics                             Anesthesia Physical Anesthesia Plan  ASA: III  Anesthesia Plan: General   Post-op Pain Management:    Induction: Intravenous  Airway Management Planned: Nasal Cannula  Additional Equipment:   Intra-op Plan:   Post-operative Plan:   Informed Consent: I have reviewed the patients History and Physical, chart, labs and discussed the procedure including the risks, benefits and alternatives for the proposed anesthesia with the patient or authorized representative who has indicated his/her understanding and acceptance.     Plan Discussed with: CRNA  Anesthesia Plan Comments:         Anesthesia Quick Evaluation

## 2015-07-10 NOTE — Care Management Important Message (Signed)
Important Message  Patient Details  Name: Douglas Mathis MRN: [id number] Date of Birth: 12-26-1935   Medicare Important Message Given:  Yes    Olegario Messier A Darshawn Boateng 07/10/2015, 10:45 AM

## 2015-07-10 NOTE — Progress Notes (Signed)
Patient ID: Douglas Mathis, male   DOB: 1935-06-22, 80 y.o.   MRN: [id number] [facility] PROGRESS NOTE  GEOGE LAWRANCE WUJ:[id number] DOB: [birth year] DOA: 07/08/2015 PCP: No primary care provider on file.  HPI/Subjective: Patient vomited a few times. Patient is a poor historian. In the ER, found to have low hemoglobin and was guaiac positive. Patient denies black stools or vomiting blood.  Objective: Filed Vitals:   07/10/15 1216 07/10/15 1517  BP: 152/61 124/72  Pulse: 86 87  Temp: 97.8 F (36.6 C) 98.3 F (36.8 C)  Resp:  18    Filed Weights   07/08/15 1533 07/08/15 2046  Weight: 81.647 kg (180 lb) 80.287 kg (177 lb)    ROS: Review of Systems  Constitutional: Negative for fever and chills.  Eyes: Negative for blurred vision.  Respiratory: Negative for cough and shortness of breath.   Cardiovascular: Negative for chest pain.  Gastrointestinal: Negative for nausea, vomiting, abdominal pain, diarrhea and constipation.  Genitourinary: Negative for dysuria.  Musculoskeletal: Negative for joint pain.  Neurological: Negative for dizziness and headaches.   Exam: Physical Exam  Constitutional: He is oriented to person, place, and time.  HENT:  Nose: No mucosal edema.  Mouth/Throat: No oropharyngeal exudate or posterior oropharyngeal edema.  Eyes: Conjunctivae, EOM and lids are normal. Pupils are equal, round, and reactive to light.  Neck: No JVD present. Carotid bruit is not present. No edema present. No thyroid mass and no thyromegaly present.  Cardiovascular: S1 normal and S2 normal.  Exam reveals no gallop.   No murmur heard. Pulses:      Dorsalis pedis pulses are 2+ on the right side, and 2+ on the left side.  Respiratory: No respiratory distress. He has no wheezes. He has no rhonchi. He has no rales.  GI: Soft. Bowel sounds are normal. There is no tenderness.  Musculoskeletal:       Right ankle: He exhibits no swelling.       Left ankle: He exhibits no  swelling.  Lymphadenopathy:    He has no cervical adenopathy.  Neurological: He is alert and oriented to person, place, and time. No cranial nerve deficit.  Skin: Skin is warm. No rash noted. Nails show no clubbing.  Psychiatric: He has a normal mood and affect.      Data Reviewed: Basic Metabolic Panel:  Recent Labs Lab 07/08/15 1539 07/09/15 0522 07/09/15 1343 07/10/15 0348  NA 125* 125* 123* 129*  K 4.7 4.0 3.8 3.6  CL 93* 92* 89* 95*  CO2 24 24 23 26   GLUCOSE 132* 105* 108* 85  BUN 10 7 8 8   CREATININE 0.73 0.60* 0.61 0.64  CALCIUM 8.7* 8.6* 8.6* 8.6*   Liver Function Tests:  Recent Labs Lab 07/08/15 1539  AST 16  ALT 9*  ALKPHOS 62  BILITOT 0.3  PROT 6.5  ALBUMIN 3.2*   CBC:  Recent Labs Lab 07/08/15 1539 07/08/15 2325 07/09/15 0522 07/09/15 1129 07/09/15 1838 07/10/15 0348  WBC 13.9*  --  13.8*  --   --   --   HGB 5.4* 6.5* 7.6* 7.4* 7.6* 7.1*  HCT 17.9*  --  24.7*  --   --   --   MCV 55.7*  --  62.4*  --   --   --   PLT 495*  --  435  --   --   --    Cardiac Enzymes:  Recent Labs Lab 07/08/15 1539  TROPONINI <0.03   BNP (last 3  results)  Recent Labs  07/08/15 1539  BNP 108.0*     Recent Results (from the past 240 hour(s))  MRSA PCR Screening     Status: None   Collection Time: 07/09/15  2:03 AM  Result Value Ref Range Status   MRSA by PCR NEGATIVE NEGATIVE Final    Comment:        The GeneXpert MRSA Assay (FDA approved for NASAL specimens only), is one component of a comprehensive MRSA colonization surveillance program. It is not intended to diagnose MRSA infection nor to guide or monitor treatment for MRSA infections.      Studies: Dg Chest 1 View  07/08/2015  CLINICAL DATA:  Weakness in congestion for 2 days. EXAM: CHEST 1 VIEW COMPARISON:  10/05/2013 FINDINGS: Atherosclerotic aortic arch. Heart size within normal limits for projection. Emphysema. Chronically blunted left lateral costophrenic angle with thickening of  the adjacent pleura. Thoracic spondylosis. IMPRESSION: 1. Stable blunting of the left lateral costophrenic angle potentially from scarring or chronic pleural effusion. 2. Atherosclerotic aortic arch. 3. Emphysema. Electronically Signed   By: Gaylyn Rong M.D.   On: 07/08/2015 17:07   US Abdomen Complete  07/09/2015  CLINICAL DATA:  80 year old male with hyponatremia. EXAM: ABDOMEN ULTRASOUND COMPLETE COMPARISON:  None. FINDINGS: Gallbladder: No gallstones or wall thickening visualized. No sonographic Murphy sign noted by sonographer. Common bile duct: Diameter: 3.4 mm. Liver: No focal lesion identified. Within normal limits in parenchymal echogenicity. IVC: No abnormality visualized. Pancreas: Not well visualized due to overlying bowel gas. Spleen: Size and appearance within normal limits. Right Kidney: Length: 11.9 cm. Echogenicity within normal limits. No mass or hydronephrosis visualized. Left Kidney: Length: 11.4 cm. Echogenicity within normal limits. No mass or hydronephrosis visualized. Abdominal aorta: Not well visualized due to overlying bowel gas. Other findings: None. IMPRESSION: No acute or significant abnormalities. Pancreas and abdominal aorta not well visualized secondary to overlying bowel gas. Electronically Signed   By: Harmon Pier M.D.   On: 07/09/2015 16:55    Scheduled Meds: . amLODipine  10 mg Oral Daily   And  . benazepril  20 mg Oral Daily  . cloNIDine  0.1 mg Oral BID  . colchicine  0.6 mg Oral Daily  . iron sucrose  400 mg Intravenous Once  . latanoprost  1 drop Both Eyes QHS  . pantoprazole (PROTONIX) IV  40 mg Intravenous Q12H  . sodium chloride  1 g Oral TID WC  . tamsulosin  0.4 mg Oral QPC breakfast    Assessment/Plan:  1. Severe Iron deficient and microcytic anemia, likely chronic blood loss. EGD showing a large duodenal ulcer and other gastric erosions.. On IV Protonix. Give IV venofer today. Clear liquid diet today 2. Hyponatremia. Likely secondary to  alcohol but patient denies this and let us 6 months. Patient was put on salt tabs and sodium had improved to 129. Stop IV fluid hydration. 3. Essential hypertension. Continue usual medications 4. Glaucoma Unspecified on latanoprost 5. BPH unspecified on Flomax  Code Status:     Code Status Orders        Start     Ordered   07/08/15 2031  Do not attempt resuscitation (DNR)   Continuous    Question Answer Comment  In the event of cardiac or respiratory ARREST Do not call a "code blue"   In the event of cardiac or respiratory ARREST Do not perform Intubation, CPR, defibrillation or ACLS   In the event of cardiac or respiratory ARREST Use medication by  any route, position, wound care, and other measures to relive pain and suffering. May use oxygen, suction and manual treatment of airway obstruction as needed for comfort.   Comments confirmed with pt      07/08/15 2030    Code Status History    Date Active Date Inactive Code Status Order ID Comments User Context   This patient has a current code status but no historical code status.     Disposition Plan: Potentially home tomorrow. Left message for daughter on phone today.  Consultants:  Gastroenterology  Nephrology  Time spent: 24 minutes  Alford Highland  [facility]

## 2015-07-11 ENCOUNTER — Encounter: Payer: Self-pay | Admitting: Unknown Physician Specialty

## 2015-07-11 LAB — TYPE AND SCREEN
ABO/RH(D): O POS
ANTIBODY SCREEN: NEGATIVE
UNIT DIVISION: 0
UNIT DIVISION: 0

## 2015-07-11 LAB — BASIC METABOLIC PANEL
ANION GAP: 9 (ref 5–15)
BUN: 6 mg/dL (ref 6–20)
CHLORIDE: 96 mmol/L — AB (ref 101–111)
CO2: 26 mmol/L (ref 22–32)
Calcium: 8.6 mg/dL — ABNORMAL LOW (ref 8.9–10.3)
Creatinine, Ser: 0.64 mg/dL (ref 0.61–1.24)
GFR calc Af Amer: >60 mL/min (ref >60)
GFR calc non Af Amer: >60 mL/min (ref >60)
GLUCOSE: 99 mg/dL (ref 65–99)
POTASSIUM: 3.3 mmol/L — AB (ref 3.5–5.1)
Sodium: 131 mmol/L — ABNORMAL LOW (ref 135–145)

## 2015-07-11 LAB — HEMOGLOBIN: Hemoglobin: 7.6 g/dL — ABNORMAL LOW (ref 13.0–18.0)

## 2015-07-11 MED ORDER — PANTOPRAZOLE SODIUM 40 MG PO TBEC: 40.0000 mg | DELAYED_RELEASE_TABLET | Freq: Two times a day (BID) | ORAL | Status: DC

## 2015-07-11 MED ORDER — FERROUS SULFATE 325 (65 FE) MG PO TABS: 325.0000 mg | ORAL_TABLET | Freq: Two times a day (BID) | ORAL | Status: AC

## 2015-07-11 MED ORDER — FERROUS SULFATE 325 (65 FE) MG PO TABS: 325.0000 mg | ORAL_TABLET | Freq: Two times a day (BID) | ORAL | Status: DC

## 2015-07-11 MED ORDER — CLONIDINE HCL 0.1 MG PO TABS: 0.1000 mg | ORAL_TABLET | Freq: Two times a day (BID) | ORAL | Status: DC

## 2015-07-11 MED ORDER — POTASSIUM CHLORIDE CRYS ER 20 MEQ PO TBCR
40.0000 meq | EXTENDED_RELEASE_TABLET | Freq: Once | ORAL | Status: AC
  Administered 2015-07-11: 40 meq via ORAL
  Filled 2015-07-11: qty 2

## 2015-07-11 NOTE — Discharge Summary (Signed)
[facility] - [place] [facility]   PATIENT NAME: Douglas Mathis    MR#:  [id number]  DATE OF BIRTH:  July 25, 1935  DATE OF ADMISSION:  07/08/2015 ADMITTING PHYSICIAN: Altamese Dilling, MD  DATE OF DISCHARGE: 07/11/2015 10:58 AM  PRIMARY CARE PHYSICIAN: No primary care provider on file.    ADMISSION DIAGNOSIS:  Hyponatremia [E87.1] Weakness [R53.1] Anemia, unspecified anemia type [D64.9] Gastrointestinal hemorrhage, unspecified gastritis, unspecified gastrointestinal hemorrhage type [K92.2]  DISCHARGE DIAGNOSIS:  Principal Problem:   GI bleed   SECONDARY DIAGNOSIS:   Past Medical History  Diagnosis Date  . Hypertension   . Hypercholesterolemia     HOSPITAL COURSE:   1. Severe iron deficiency anemia. This is chronic blood loss from duodenal ulcer. The patient denied any pain. He denied any bleeding. On endoscopy was found to have a large duodenal ulcer. He was started on Protonix IV twice a day while here. Oral Protonix upon discharge home. He was transfused 2 units of packed red blood cells and given an iron infusion. Hemoglobin upon discharge 7.6. He can start iron as outpatient. He was advised to stop BC powder and any other anti-inflammatories. He was also advised to stop drinking alcohol. 2. Hyponatremia. Likely secondary to alcohol. Patient was given sodium chloride tablets in order to get his sodium up. Sodium upon discharge 131. 3. Hypokalemia. I did given potassium replacement while here. 4. Essential hypertension. I decreased his clonidine to 0.1 mg twice a day. He will continue Lotrel. 5. Glaucoma unspecified on latanoprost 6. BPH on Flomax  DISCHARGE CONDITIONS:   Satisfactory  CONSULTS OBTAINED:  Treatment Team:  Scot Jun, MD  DRUG ALLERGIES:  No Known Allergies  DISCHARGE MEDICATIONS:   Discharge Medication List as of 07/11/2015  9:32 AM    START taking these medications   Details  ferrous sulfate 325 (65 FE) MG  tablet Take 1 tablet (325 mg total) by mouth 2 (two) times daily with a meal., Starting 07/22/2015, Until Discontinued, Normal    pantoprazole (PROTONIX) 40 MG tablet Take 1 tablet (40 mg total) by mouth 2 (two) times daily., Starting 07/11/2015, Until Discontinued, Normal      CONTINUE these medications which have CHANGED   Details  cloNIDine (CATAPRES) 0.1 MG tablet Take 1 tablet (0.1 mg total) by mouth 2 (two) times daily., Starting 07/11/2015, Until Discontinued, Normal      CONTINUE these medications which have NOT CHANGED   Details  amLODipine-benazepril (LOTREL) 10-20 MG capsule Take 1 capsule by mouth daily., Until Discontinued, Historical Med    bimatoprost (LUMIGAN) 0.01 % SOLN Place 1 drop into both eyes at bedtime., Until Discontinued, Historical Med    colchicine 0.6 MG tablet Take 0.6 mg by mouth daily., Until Discontinued, Historical Med    tamsulosin (FLOMAX) 0.4 MG CAPS capsule Take 0.4 mg by mouth daily after breakfast. , Until Discontinued, Historical Med      STOP taking these medications     Aspirin-Salicylamide-Caffeine (BC HEADACHE PO)          DISCHARGE INSTRUCTIONS:   Follow-up PMD one week Follow-up with Dr. Mechele Collin 2-3 weeks  If you experience worsening of your admission symptoms, develop shortness of breath, life threatening emergency, suicidal or homicidal thoughts you must seek medical attention immediately by calling 911 or calling your MD immediately  if symptoms less severe.  You Must read complete instructions/literature along with all the possible adverse reactions/side effects for all the Medicines you take and that have been prescribed to you.  Take any new Medicines after you have completely understood and accept all the possible adverse reactions/side effects.   Please note  You were cared for by a hospitalist during your hospital stay. If you have any questions about your discharge medications or the care you received while you were in the  hospital after you are discharged, you can call the unit and asked to speak with the hospitalist on call if the hospitalist that took care of you is not available. Once you are discharged, your primary care physician will handle any further medical issues. Please note that NO REFILLS for any discharge medications will be authorized once you are discharged, as it is imperative that you return to your primary care physician (or establish a relationship with a primary care physician if you do not have one) for your aftercare needs so that they can reassess your need for medications and monitor your lab values.    Today   CHIEF COMPLAINT:   Chief Complaint  Patient presents with  . Fatigue    HISTORY OF PRESENT ILLNESS:  Douglas Mathis  is a 80 y.o. male presented with fatigue and found to have a severe anemia with a hemoglobin of 5.4 on presentation.   VITAL SIGNS:  Blood pressure 149/63, pulse 81, temperature 97.6 F (36.4 C), temperature source Axillary, resp. rate 18, height 5\' 6"  (1.676 m), weight 80.287 kg (177 lb), SpO2 98 %.    PHYSICAL EXAMINATION:  GENERAL:  80 y.o.-year-old patient lying in the bed with no acute distress.  EYES: Pupils equal, round, reactive to light and accommodation. No scleral icterus. Extraocular muscles intact.  HEENT: Head atraumatic, normocephalic. Oropharynx and nasopharynx clear.  NECK:  Supple, no jugular venous distention. No thyroid enlargement, no tenderness.  LUNGS: Normal breath sounds bilaterally, no wheezing, rales,rhonchi or crepitation. No use of accessory muscles of respiration.  CARDIOVASCULAR: S1, S2 normal. No murmurs, rubs, or gallops.  ABDOMEN: Soft, non-tender, non-distended. Bowel sounds present. No organomegaly or mass.  EXTREMITIES: No pedal edema, cyanosis, or clubbing.  NEUROLOGIC: Cranial nerves II through XII are intact. Muscle strength 5/5 in all extremities. Sensation intact. Gait not checked.  PSYCHIATRIC: The patient is  alert.  SKIN: No obvious rash, lesion, or ulcer.   DATA REVIEW:   CBC  Recent Labs Lab 07/09/15 0522  07/11/15 0319  WBC 13.8*  --   --   HGB 7.6*  < > 7.6*  HCT 24.7*  --   --   PLT 435  --   --   < > = values in this interval not displayed.  Chemistries   Recent Labs Lab 07/08/15 1539  07/11/15 0319  NA 125*  < > 131*  K 4.7  < > 3.3*  CL 93*  < > 96*  CO2 24  < > 26  GLUCOSE 132*  < > 99  BUN 10  < > 6  CREATININE 0.73  < > 0.64  CALCIUM 8.7*  < > 8.6*  AST 16  --   --   ALT 9*  --   --   ALKPHOS 62  --   --   BILITOT 0.3  --   --   < > = values in this interval not displayed.  Cardiac Enzymes  Recent Labs Lab 07/08/15 1539  TROPONINI <0.03    Microbiology Results  Results for orders placed or performed during the hospital encounter of 07/08/15  MRSA PCR Screening     Status: None  Collection Time: 07/09/15  2:03 AM  Result Value Ref Range Status   MRSA by PCR NEGATIVE NEGATIVE Final    Comment:        The GeneXpert MRSA Assay (FDA approved for NASAL specimens only), is one component of a comprehensive MRSA colonization surveillance program. It is not intended to diagnose MRSA infection nor to guide or monitor treatment for MRSA infections.     RADIOLOGY:  US Abdomen Complete  07/09/2015  CLINICAL DATA:  80 year old male with hyponatremia. EXAM: ABDOMEN ULTRASOUND COMPLETE COMPARISON:  None. FINDINGS: Gallbladder: No gallstones or wall thickening visualized. No sonographic Murphy sign noted by sonographer. Common bile duct: Diameter: 3.4 mm. Liver: No focal lesion identified. Within normal limits in parenchymal echogenicity. IVC: No abnormality visualized. Pancreas: Not well visualized due to overlying bowel gas. Spleen: Size and appearance within normal limits. Right Kidney: Length: 11.9 cm. Echogenicity within normal limits. No mass or hydronephrosis visualized. Left Kidney: Length: 11.4 cm. Echogenicity within normal limits. No mass or  hydronephrosis visualized. Abdominal aorta: Not well visualized due to overlying bowel gas. Other findings: None. IMPRESSION: No acute or significant abnormalities. Pancreas and abdominal aorta not well visualized secondary to overlying bowel gas. Electronically Signed   By: Harmon Pier M.D.   On: 07/09/2015 16:55    Management plans discussed with the patient, family and they are in agreement.  CODE STATUS:  Code Status History    Date Active Date Inactive Code Status Order ID Comments User Context   07/08/2015  8:30 PM 07/11/2015  1:58 PM DNR [id number]  Altamese Dilling, MD Inpatient    Questions for Most Recent Historical Code Status (Order [id number])    Question Answer Comment   In the event of cardiac or respiratory ARREST Do not call a "code blue"    In the event of cardiac or respiratory ARREST Do not perform Intubation, CPR, defibrillation or ACLS    In the event of cardiac or respiratory ARREST Use medication by any route, position, wound care, and other measures to relive pain and suffering. May use oxygen, suction and manual treatment of airway obstruction as needed for comfort.    Comments confirmed with pt       TOTAL TIME TAKING CARE OF THIS PATIENT: 35 minutes.    Alford Highland M.D on 07/11/2015 at 3:45 PM  Between 7am to 6pm - Pager - [phone]  After 6pm go to [email or web address] - password [email or web address]  [facility] Office  [phone]  CC: Primary care physician; No primary care provider on file.

## 2015-07-11 NOTE — Progress Notes (Signed)
Patient is to be discharged home today. Patient is in no acute distress at this time, and assessment is unchanged from this morning. Patient's IV is out, discharge paperwork has been discussed with patient/family and there are no questions or concerns at this time. Patient will be accompanied downstairs by staff and family via wheelchair.   

## 2015-07-11 NOTE — Discharge Instructions (Signed)
No alcohol. No BC powder, No aspirin, advil, motrin, alleve or any other antiinflamatories

## 2015-07-11 NOTE — Care Management Note (Signed)
Case Management Note  Patient Details  Name: Douglas Mathis MRN: [id number] Date of Birth: 1935-11-04  Subjective/Objective:         Discharge to home. Granddaughter will transport him home. Douglas Mathis refused offer of a rolling walker. No home health services ordered.            Action/Plan:   Expected Discharge Date:                  Expected Discharge Plan:     In-House Referral:     Discharge planning Services     Post Acute Care Choice:    Choice offered to:     DME Arranged:    DME Agency:     HH Arranged:    HH Agency:     Status of Service:     Medicare Important Message Given:  Yes Date Medicare IM Given:    Medicare IM give by:    Date Additional Medicare IM Given:    Additional Medicare Important Message give by:     If discussed at Long Length of Stay Meetings, dates discussed:    Additional Comments:  Douglas Mathis A, RN 07/11/2015, 10:29 AM

## 2015-07-11 NOTE — Care Management Important Message (Signed)
Important Message  Patient Details  Name: Douglas Mathis MRN: [id number] Date of Birth: 05/15/1935   Medicare Important Message Given:  Yes    Gen Clagg A, RN 07/11/2015, 7:00 AM

## 2015-10-16 ENCOUNTER — Ambulatory Visit
Admission: RE | Admit: 2015-10-16 | Payer: Medicare Other | Source: Ambulatory Visit | Admitting: Unknown Physician Specialty

## 2015-10-16 ENCOUNTER — Encounter: Admission: RE | Payer: Self-pay | Source: Ambulatory Visit

## 2015-10-16 SURGERY — ESOPHAGOGASTRODUODENOSCOPY (EGD) WITH PROPOFOL
Anesthesia: General

## 2015-12-16 ENCOUNTER — Ambulatory Visit
Admission: RE | Admit: 2015-12-16 | Payer: Medicare Other | Source: Ambulatory Visit | Admitting: Unknown Physician Specialty

## 2015-12-16 ENCOUNTER — Encounter: Admission: RE | Payer: Self-pay | Source: Ambulatory Visit

## 2015-12-16 SURGERY — COLONOSCOPY WITH PROPOFOL
Anesthesia: General

## 2016-06-10 ENCOUNTER — Emergency Department
Admission: EM | Admit: 2016-06-10 | Discharge: 2016-06-10 | Disposition: A | Discharge status: Home / Self Care | Payer: No Typology Code available for payment source | Attending: Student in an Organized Health Care Education/Training Program | Admitting: Student in an Organized Health Care Education/Training Program

## 2016-06-10 ENCOUNTER — Encounter: Payer: Self-pay | Admitting: *Deleted

## 2016-06-10 ENCOUNTER — Emergency Department: Payer: No Typology Code available for payment source

## 2016-06-10 DIAGNOSIS — M542 Cervicalgia: Secondary | ICD-10-CM | POA: Insufficient documentation

## 2016-06-10 DIAGNOSIS — Y92410 Unspecified street and highway as the place of occurrence of the external cause: Secondary | ICD-10-CM | POA: Insufficient documentation

## 2016-06-10 DIAGNOSIS — Y9389 Activity, other specified: Secondary | ICD-10-CM | POA: Insufficient documentation

## 2016-06-10 DIAGNOSIS — S4991XA Unspecified injury of right shoulder and upper arm, initial encounter: Secondary | ICD-10-CM | POA: Diagnosis present

## 2016-06-10 DIAGNOSIS — M25512 Pain in left shoulder: Secondary | ICD-10-CM | POA: Insufficient documentation

## 2016-06-10 DIAGNOSIS — F17200 Nicotine dependence, unspecified, uncomplicated: Secondary | ICD-10-CM | POA: Diagnosis not present

## 2016-06-10 DIAGNOSIS — R930 Abnormal findings on diagnostic imaging of skull and head, not elsewhere classified: Secondary | ICD-10-CM | POA: Diagnosis not present

## 2016-06-10 DIAGNOSIS — M25511 Pain in right shoulder: Secondary | ICD-10-CM | POA: Insufficient documentation

## 2016-06-10 DIAGNOSIS — Y999 Unspecified external cause status: Secondary | ICD-10-CM | POA: Insufficient documentation

## 2016-06-10 DIAGNOSIS — M549 Dorsalgia, unspecified: Secondary | ICD-10-CM | POA: Diagnosis not present

## 2016-06-10 DIAGNOSIS — Z79899 Other long term (current) drug therapy: Secondary | ICD-10-CM | POA: Diagnosis not present
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
## 2017-06-28 ENCOUNTER — Emergency Department: Payer: Medicare Other

## 2017-06-28 ENCOUNTER — Other Ambulatory Visit: Payer: Self-pay

## 2017-06-28 ENCOUNTER — Emergency Department
Admission: EM | Admit: 2017-06-28 | Discharge: 2017-06-28 | Disposition: A | Discharge status: Home / Self Care | Payer: Medicare Other | Attending: Emergency Medicine | Admitting: Emergency Medicine

## 2017-06-28 DIAGNOSIS — R1033 Periumbilical pain: Secondary | ICD-10-CM | POA: Diagnosis not present

## 2017-06-28 DIAGNOSIS — R1010 Upper abdominal pain, unspecified: Secondary | ICD-10-CM | POA: Diagnosis present

## 2017-06-28 DIAGNOSIS — Z79899 Other long term (current) drug therapy: Secondary | ICD-10-CM | POA: Insufficient documentation

## 2017-06-28 DIAGNOSIS — I10 Essential (primary) hypertension: Secondary | ICD-10-CM | POA: Insufficient documentation

## 2017-06-28 DIAGNOSIS — F17210 Nicotine dependence, cigarettes, uncomplicated: Secondary | ICD-10-CM | POA: Diagnosis not present

## 2017-06-28 DIAGNOSIS — R52 Pain, unspecified: Secondary | ICD-10-CM

## 2017-06-28 LAB — COMPREHENSIVE METABOLIC PANEL
ALT: 16 U/L — ABNORMAL LOW (ref 17–63)
ANION GAP: 7 (ref 5–15)
AST: 23 U/L (ref 15–41)
Albumin: 3.7 g/dL (ref 3.5–5.0)
Alkaline Phosphatase: 68 U/L (ref 38–126)
BILIRUBIN TOTAL: 0.3 mg/dL (ref 0.3–1.2)
BUN: 11 mg/dL (ref 6–20)
CALCIUM: 8.5 mg/dL — AB (ref 8.9–10.3)
CO2: 28 mmol/L (ref 22–32)
Chloride: 97 mmol/L — ABNORMAL LOW (ref 101–111)
Creatinine, Ser: 0.95 mg/dL (ref 0.61–1.24)
GFR calc non Af Amer: >60 mL/min (ref >60)
GLUCOSE: 167 mg/dL — AB (ref 65–99)
POTASSIUM: 3.7 mmol/L (ref 3.5–5.1)
Sodium: 132 mmol/L — ABNORMAL LOW (ref 135–145)
TOTAL PROTEIN: 7.6 g/dL (ref 6.5–8.1)

## 2017-06-28 LAB — CBC
HCT: 33.7 % — ABNORMAL LOW (ref 40.0–52.0)
HEMOGLOBIN: 10.9 g/dL — AB (ref 13.0–18.0)
MCH: 25.3 pg — AB (ref 26.0–34.0)
MCHC: 32.2 g/dL (ref 32.0–36.0)
MCV: 78.5 fL — ABNORMAL LOW (ref 80.0–100.0)
PLATELETS: 200 10*3/uL (ref 150–440)
RBC: 4.3 MIL/uL — AB (ref 4.40–5.90)
RDW: 22.1 % — ABNORMAL HIGH (ref 11.5–14.5)
WBC: 13 10*3/uL — AB (ref 3.8–10.6)

## 2017-06-28 LAB — TROPONIN I: TROPONIN I: 0.03 ng/mL — AB (ref <0.03)

## 2017-06-28 LAB — LIPASE, BLOOD: LIPASE: 22 U/L (ref 11–51)

## 2017-06-28 MED ORDER — MECLIZINE HCL 25 MG PO TABS
12.5000 mg | ORAL_TABLET | Freq: Once | ORAL | Status: AC
  Administered 2017-06-28: 12.5 mg via ORAL
  Filled 2017-06-28: qty 1

## 2017-06-28 MED ORDER — SODIUM CHLORIDE 0.9 % IV BOLUS
500.0000 mL | INTRAVENOUS | Status: AC
  Administered 2017-06-28: 500 mL via INTRAVENOUS

## 2017-06-28 NOTE — ED Notes (Signed)
Dr York Cerise notified of elevated troponin of 0.03 - no new orders at this time

## 2017-06-28 NOTE — ED Notes (Signed)
Pt assisted to use urinal to void and pt given Sprite to drink

## 2017-06-28 NOTE — ED Triage Notes (Signed)
Pt arrived via ems for c/o chest pain that started 1 hour ago - pt denies ever having chest pain before - pt does not radiate and pain is located in middle of chest - denies N/V - denies shortness of breath but reports a cough for the last week and a half

## 2017-06-28 NOTE — ED Notes (Signed)
Assisted pt to void in urinal

## 2017-06-28 NOTE — Discharge Instructions (Addendum)
Your workup in the Emergency Department today was reassuring.  We did not find any specific abnormalities.  We recommend you drink plenty of fluids, take your regular medications and/or any new ones prescribed today, and follow up with the doctor(s) listed in these documents as recommended.  Return to the Emergency Department if you develop new or worsening symptoms that concern you.  

## 2017-06-28 NOTE — ED Provider Notes (Signed)
[facility] Emergency Department Provider Note  ____________________________________________   First MD Initiated Contact with Patient 06/28/17 1532     (approximate)  I have reviewed the triage vital signs and the nursing notes.   HISTORY  Chief Complaint Chest Pain    HPI Douglas Mathis is a 82 y.o. male with medical history as listed below presents for evaluation of acute onset upper abdominal (not chest) pain that started about 45 minutes prior to arrival in the emergency department.  He states he does not typically have this pain.  He points to his epigastrium and says that it does not radiate.  Nothing in particular seems to make it better or worse.  He also reports that over the last day or so he has been feeling lightheaded and dizzy when he changes position.  This happened to him in the past and he says he was told it was a problem with his inner ear.  He typically does not have symptoms but it happens to him sometimes.  He has had no weakness in any of his extremities.  He denies upper or central chest pain, nausea, vomiting, lower abdominal pain, diaphoresis.  He does state that he has had a cough for about a week and a half but he also has a history of COPD.  He describes the symptoms as severe initially but current mild.   Past Medical History:  Diagnosis Date  . Hypercholesterolemia   . Hypertension     Patient Active Problem List   Diagnosis Date Noted  . GI bleed 07/08/2015    Past Surgical History:  Procedure Laterality Date  . ESOPHAGOGASTRODUODENOSCOPY N/A 07/10/2015   Procedure: ESOPHAGOGASTRODUODENOSCOPY (EGD);  Surgeon: Scot Jun, MD;  Location: [facility] ENDOSCOPY;  Service: Endoscopy;  Laterality: N/A;    Prior to Admission medications   Medication Sig Start Date End Date Taking? Authorizing Provider  amLODipine-benazepril (LOTREL) 10-20 MG capsule Take 1 capsule by mouth daily.    [provider]  bimatoprost  (LUMIGAN) 0.01 % SOLN Place 1 drop into both eyes at bedtime.    [provider]  cloNIDine (CATAPRES) 0.1 MG tablet Take 1 tablet (0.1 mg total) by mouth 2 (two) times daily. 07/11/15   Alford Highland, MD  colchicine 0.6 MG tablet Take 0.6 mg by mouth daily.    [provider]  ferrous sulfate 325 (65 FE) MG tablet Take 1 tablet (325 mg total) by mouth 2 (two) times daily with a meal. 07/22/15   Renae Gloss, Richard, MD  pantoprazole (PROTONIX) 40 MG tablet Take 1 tablet (40 mg total) by mouth 2 (two) times daily. 07/11/15   Alford Highland, MD  tamsulosin (FLOMAX) 0.4 MG CAPS capsule Take 0.4 mg by mouth daily after breakfast.     [provider]    Allergies Patient has no known allergies.  Family History  Problem Relation Age of Onset  . Diabetes Mother   . Bone cancer Brother     Social History Social History   Tobacco Use  . Smoking status: Current Every Day Smoker    Packs/day: 1.00  . Smokeless tobacco: Never Used  Substance Use Topics  . Alcohol use: No  . Drug use: Never    Review of Systems Constitutional: No fever/chills Eyes: No visual changes. ENT: No sore throat. Cardiovascular: Upper abdominal versus lower chest pain that started about an hour ago, now resolved. Respiratory: Denies shortness of breath. Gastrointestinal: Upper abdominal versus lower chest pain that started  about an hour ago, now resolved.  No nausea, no vomiting.  No diarrhea.  No constipation. Genitourinary: Negative for dysuria. Musculoskeletal: Negative for neck pain.  Negative for back pain. Integumentary: Negative for rash. Neurological: Lightheaded/dizzy when he stands up and changes position for at least a day.  Negative for headaches, focal weakness or numbness.   ____________________________________________   PHYSICAL EXAM:  VITAL SIGNS: ED Triage Vitals  Enc Vitals Group     BP 06/28/17 1504 (!) 164/72     Pulse Rate 06/28/17 1512 90     Resp 06/28/17  1512 16     Temp 06/28/17 1512 98.2 F (36.8 C)     Temp Source 06/28/17 1512 Oral     SpO2 06/28/17 1512 96 %     Weight 06/28/17 1513 83.9 kg (185 lb)     Height 06/28/17 1513 1.753 m (5\' 9" )     Head Circumference --      Peak Flow --      Pain Score 06/28/17 1512 2     Pain Loc --      Pain Edu? --      Excl. in GC? --     Constitutional: Alert and oriented. Well appearing and in no acute distress. Eyes: Conjunctivae are normal.  Head: Atraumatic. Nose: No congestion/rhinnorhea. Mouth/Throat: Mucous membranes are moist. Neck: No stridor.  No meningeal signs.   Cardiovascular: Normal rate, regular rhythm. Good peripheral circulation. Grossly normal heart sounds. Respiratory: Normal respiratory effort.  No retractions. Lungs CTAB. Gastrointestinal: Soft and nontender. No distention.  Musculoskeletal: No lower extremity tenderness nor edema. No gross deformities of extremities. Neurologic:  Normal speech and language. No gross focal neurologic deficits are appreciated.  Good muscle strength throughout all major muscle groups, upper and lower extremities. Skin:  Skin is warm, dry and intact. No rash noted. Psychiatric: Mood and affect are normal. Speech and behavior are normal.  ____________________________________________   LABS (all labs ordered are listed, but only abnormal results are displayed)  Labs Reviewed  COMPREHENSIVE METABOLIC PANEL - Abnormal; Notable for the following components:      Result Value   Sodium 132 (*)    Chloride 97 (*)    Glucose, Bld 167 (*)    Calcium 8.5 (*)    ALT 16 (*)    All other components within normal limits  CBC - Abnormal; Notable for the following components:   WBC 13.0 (*)    RBC 4.30 (*)    Hemoglobin 10.9 (*)    HCT 33.7 (*)    MCV 78.5 (*)    MCH 25.3 (*)    RDW 22.1 (*)    All other components within normal limits  TROPONIN I - Abnormal; Notable for the following components:   Troponin I 0.03 (*)    All other  components within normal limits  LIPASE, BLOOD  TROPONIN I   ____________________________________________  EKG  ED ECG REPORT I, Loleta Rose, the attending physician, personally viewed and interpreted this ECG.  Date: 06/28/2017 EKG Time: 15: 09 Rate: 90 Rhythm: Sinus with first-degree AV block with occasional PVC QRS Axis: normal Intervals: PR interval is 227 ms with right bundle branch block and left anterior fascicular block, probable LVH ST/T Wave abnormalities: Non-specific ST segment / T-wave changes, but no evidence of acute ischemia. Narrative Interpretation: no evidence of acute ischemia.  No significant change from prior EKG from 2 years ago.   ____________________________________________  RADIOLOGY I, Loleta Rose, personally viewed and evaluated  these images (plain radiographs) as part of my medical decision making, as well as reviewing the written report by the radiologist.  ED MD interpretation: COPD but without any evidence of acute infection or abnormality  Official radiology report(s): Dg Chest 2 View  Result Date: 06/28/2017 CLINICAL DATA:  Onset of chest pain 1 hour prior to admission. The discomfort centered in the mid chest and did not radiate. No associated nausea vomiting or shortness of breath. The patient has had a cough for the past 10 days. The patient became dizzy upon standing for the x-ray. Current smoker. EXAM: CHEST - 2 VIEW COMPARISON:  Portable chest x-ray of Jul 08, 2015 FINDINGS: The lungs are mildly hyperinflated. There is stable pleural thickening and tenting of the hemidiaphragm on the left at the base. The heart and pulmonary vascularity are normal. The mediastinum is normal in width. There is calcification in the wall of the aortic arch. The trachea is midline. The bony thorax exhibits no acute abnormality. IMPRESSION: COPD. Stable pleuroparenchymal scarring at the left base. No pneumonia nor CHF. Thoracic aortic atherosclerosis. Electronically  Signed   By: David  [place] M.D.   On: 06/28/2017 15:46   US Aorta/ivc/iliacs Dop Ltd  Result Date: 06/28/2017 CLINICAL DATA:  82 year old male with periumbilical and chest pain. EXAM: ULTRASOUND OF ABDOMINAL AORTA TECHNIQUE: Ultrasound examination of the abdominal aorta was performed to evaluate for abdominal aortic aneurysm. COMPARISON:  Concurrently obtained right upper quadrant ultrasound FINDINGS: Abdominal aortic measurements as follows: Proximal:  2.7 cm Mid:  2.1 cm Distal:  2.0 cm Right iliac artery: Not well visualized, obscured by gas. Left iliac artery: Not well visualized, obscured by gas. Aorta: 79 centimeters/second. Mild heterogeneous atherosclerotic plaque. IMPRESSION: 1. Negative for abdominal aortic aneurysm. 2.  Aortic Atherosclerosis (ICD10-170.0) 3. The common iliac arteries are not well seen bilaterally secondary to obscuring bowel gas. Electronically Signed   By: Malachy Moan M.D.   On: 06/28/2017 17:23   US Abdomen Limited Ruq  Result Date: 06/28/2017 CLINICAL DATA:  82 year old male with mid epigastric and right upper quadrant pain EXAM: ULTRASOUND ABDOMEN LIMITED RIGHT UPPER QUADRANT COMPARISON:  None. FINDINGS: Gallbladder: No gallstones or wall thickening visualized. No sonographic Murphy sign noted by sonographer. Common bile duct: Diameter: Normal at 2 mm. Liver: No focal lesion identified. Within normal limits in parenchymal echogenicity. Portal vein is patent on color Doppler imaging with normal direction of blood flow towards the liver. IMPRESSION: Normal right upper quadrant ultrasound. Electronically Signed   By: Malachy Moan M.D.   On: 06/28/2017 17:21    ____________________________________________   PROCEDURES  Critical Care performed: No   Procedure(s) performed:   Procedures   ____________________________________________   INITIAL IMPRESSION / ASSESSMENT AND PLAN / ED COURSE  As part of my medical decision making, I reviewed the following  data within the electronic MEDICAL RECORD NUMBER Nursing notes reviewed and incorporated, Labs reviewed , EKG interpreted , Old chart reviewed, Radiograph reviewed  and Notes from prior ED visits    Differential diagnosis includes, but is not limited to, biliary colic, acid reflux, gastric ulcer, diverticulitis, AAA, ACS, PE, pneumonia.  The patient is quite clearly tender in the epigastrium and in the right upper quadrant and this could represent biliary colic.  He clearly indicates the area of pain is below his diaphragm and I do not think that this represents chest pain or ACS.  He has no tenderness to palpation of the lower abdomen.  Given his age and risk factors I  will evaluate with an ultrasound of his right upper quadrant as well as an ultrasound of his aorta given that AAA is also a possibility.  He is comfortable and stable with no symptoms at this time.  When I laid his bed down flat he did have a reproduction of the dizziness/vertigo.  I will give him a small dose of meclizine 12.5 mg by mouth as well as 500 mL of normal saline IV to see if this helps with the symptoms.  Clinical Course as of Jun 28 2001  Mon Jun 28, 2017  2001 The patient's second troponin was 0.03.  I checked on him and he says that he has been asymptomatic since getting here about 5 hours ago.  He is ambulatory without any difficulty and says he is ready to go home.  I do not think that the change from <0.03 to 0.03 represents a significant change in troponin or represents ACS.  The patient is generally well-appearing in spite of his age and is asymptomatic at this time.  His ultrasounds were reassuring with no evidence of acute abnormality and his chest x-ray showed no acute abnormality.  I will discharge him with strict return precautions if he develops any new or worsening symptoms that concern him.  He agrees with this plan.  He is currently not dizzy and has been ambulating back and forth to the bathroom without any trouble.     [CF]    Clinical Course User Index [CF] Loleta Rose, MD    ____________________________________________  FINAL CLINICAL IMPRESSION(S) / ED DIAGNOSES  Final diagnoses:  Periumbilical pain  Pain and tenderness     MEDICATIONS GIVEN DURING THIS VISIT:  Medications  sodium chloride 0.9 % bolus 500 mL (0 mLs Intravenous Stopped 06/28/17 1802)  meclizine (ANTIVERT) tablet 12.5 mg (12.5 mg Oral Given 06/28/17 1606)     ED Discharge Orders    None       Note:  This document was prepared using Dragon voice recognition software and may include unintentional dictation errors.    Loleta Rose, MD 06/28/17 2003

## 2017-10-25 ENCOUNTER — Emergency Department: Payer: Medicare Other

## 2017-10-25 ENCOUNTER — Other Ambulatory Visit: Payer: Self-pay

## 2017-10-25 ENCOUNTER — Encounter: Payer: Self-pay | Admitting: Emergency Medicine

## 2017-10-25 ENCOUNTER — Inpatient Hospital Stay
Admit: 2017-10-25 | Discharge: 2017-10-25 | Disposition: A | Discharge status: Home / Self Care | Payer: Medicare Other | Attending: Internal Medicine | Admitting: Internal Medicine

## 2017-10-25 ENCOUNTER — Inpatient Hospital Stay
Admission: EM | Admit: 2017-10-25 | Discharge: 2017-10-27 | DRG: 190 | Disposition: A | Discharge status: Home Health | Payer: Medicare Other | Attending: Family Medicine | Admitting: Family Medicine

## 2017-10-25 DIAGNOSIS — Z79899 Other long term (current) drug therapy: Secondary | ICD-10-CM | POA: Diagnosis not present

## 2017-10-25 DIAGNOSIS — R634 Abnormal weight loss: Secondary | ICD-10-CM | POA: Diagnosis present

## 2017-10-25 DIAGNOSIS — F17210 Nicotine dependence, cigarettes, uncomplicated: Secondary | ICD-10-CM | POA: Diagnosis present

## 2017-10-25 DIAGNOSIS — N400 Enlarged prostate without lower urinary tract symptoms: Secondary | ICD-10-CM | POA: Diagnosis present

## 2017-10-25 DIAGNOSIS — I10 Essential (primary) hypertension: Secondary | ICD-10-CM | POA: Diagnosis present

## 2017-10-25 DIAGNOSIS — E119 Type 2 diabetes mellitus without complications: Secondary | ICD-10-CM | POA: Diagnosis present

## 2017-10-25 DIAGNOSIS — J9621 Acute and chronic respiratory failure with hypoxia: Secondary | ICD-10-CM | POA: Diagnosis present

## 2017-10-25 DIAGNOSIS — Z6827 Body mass index (BMI) 27.0-27.9, adult: Secondary | ICD-10-CM | POA: Diagnosis not present

## 2017-10-25 DIAGNOSIS — Z66 Do not resuscitate: Secondary | ICD-10-CM | POA: Diagnosis present

## 2017-10-25 DIAGNOSIS — Z833 Family history of diabetes mellitus: Secondary | ICD-10-CM | POA: Diagnosis not present

## 2017-10-25 DIAGNOSIS — J439 Emphysema, unspecified: Secondary | ICD-10-CM

## 2017-10-25 DIAGNOSIS — E785 Hyperlipidemia, unspecified: Secondary | ICD-10-CM | POA: Diagnosis present

## 2017-10-25 DIAGNOSIS — D509 Iron deficiency anemia, unspecified: Secondary | ICD-10-CM | POA: Diagnosis present

## 2017-10-25 DIAGNOSIS — J441 Chronic obstructive pulmonary disease with (acute) exacerbation: Secondary | ICD-10-CM

## 2017-10-25 DIAGNOSIS — R0902 Hypoxemia: Secondary | ICD-10-CM

## 2017-10-25 DIAGNOSIS — R079 Chest pain, unspecified: Secondary | ICD-10-CM

## 2017-10-25 HISTORY — DX: Benign prostatic hyperplasia without lower urinary tract symptoms: N40.0

## 2017-10-25 HISTORY — DX: Chronic obstructive pulmonary disease, unspecified: J44.9

## 2017-10-25 HISTORY — DX: Anemia, unspecified: D64.9

## 2017-10-25 LAB — CBC WITH DIFFERENTIAL/PLATELET
BLASTS: 0 %
Band Neutrophils: 0 %
Basophils Absolute: 0 10*3/uL (ref 0–0.1)
Basophils Relative: 0 %
EOS ABS: 0 10*3/uL (ref 0–0.7)
Eosinophils Relative: 0 %
HEMATOCRIT: 27.6 % — AB (ref 40.0–52.0)
Hemoglobin: 8.8 g/dL — ABNORMAL LOW (ref 13.0–18.0)
LYMPHS ABS: 0.6 10*3/uL — AB (ref 1.0–3.6)
Lymphocytes Relative: 5 %
MCH: 25.2 pg — ABNORMAL LOW (ref 26.0–34.0)
MCHC: 31.9 g/dL — ABNORMAL LOW (ref 32.0–36.0)
MCV: 79 fL — AB (ref 80.0–100.0)
Metamyelocytes Relative: 0 %
Monocytes Absolute: 0.4 10*3/uL (ref 0.2–1.0)
Monocytes Relative: 4 %
Myelocytes: 0 %
NEUTROS ABS: 10.1 10*3/uL — AB (ref 1.4–6.5)
NEUTROS PCT: 91 %
NRBC: 0 /100{WBCs}
Other: 0 %
PROMYELOCYTES RELATIVE: 0 %
Platelets: 289 10*3/uL (ref 150–440)
RBC: 3.49 MIL/uL — AB (ref 4.40–5.90)
RDW: 22.5 % — ABNORMAL HIGH (ref 11.5–14.5)
WBC: 11.1 10*3/uL — AB (ref 3.8–10.6)

## 2017-10-25 LAB — COMPREHENSIVE METABOLIC PANEL
ALBUMIN: 3.2 g/dL — AB (ref 3.5–5.0)
ALK PHOS: 61 U/L (ref 38–126)
ALT: 17 U/L (ref 0–44)
AST: 23 U/L (ref 15–41)
Anion gap: 10 (ref 5–15)
BUN: 12 mg/dL (ref 8–23)
CALCIUM: 7.9 mg/dL — AB (ref 8.9–10.3)
CO2: 27 mmol/L (ref 22–32)
CREATININE: 0.75 mg/dL (ref 0.61–1.24)
Chloride: 99 mmol/L (ref 98–111)
GFR calc Af Amer: >60 mL/min (ref >60)
GFR calc non Af Amer: >60 mL/min (ref >60)
GLUCOSE: 118 mg/dL — AB (ref 70–99)
Potassium: 4.4 mmol/L (ref 3.5–5.1)
SODIUM: 136 mmol/L (ref 135–145)
Total Bilirubin: 0.6 mg/dL (ref 0.3–1.2)
Total Protein: 6.7 g/dL (ref 6.5–8.1)

## 2017-10-25 LAB — HEPATIC FUNCTION PANEL
ALT: 14 U/L (ref 0–44)
AST: 28 U/L (ref 15–41)
Albumin: 3.1 g/dL — ABNORMAL LOW (ref 3.5–5.0)
Alkaline Phosphatase: 60 U/L (ref 38–126)
BILIRUBIN DIRECT: 0.2 mg/dL (ref 0.0–0.2)
BILIRUBIN TOTAL: 0.6 mg/dL (ref 0.3–1.2)
Indirect Bilirubin: 0.4 mg/dL (ref 0.3–0.9)
Total Protein: 6.4 g/dL — ABNORMAL LOW (ref 6.5–8.1)

## 2017-10-25 LAB — BRAIN NATRIURETIC PEPTIDE: B Natriuretic Peptide: 361 pg/mL — ABNORMAL HIGH (ref 0.0–100.0)

## 2017-10-25 LAB — IRON AND TIBC
IRON: 19 ug/dL — AB (ref 45–182)
Saturation Ratios: 6 % — ABNORMAL LOW (ref 17.9–39.5)
TIBC: 320 ug/dL (ref 250–450)
UIBC: 301 ug/dL

## 2017-10-25 LAB — GLUCOSE, CAPILLARY
Glucose-Capillary: 222 mg/dL — ABNORMAL HIGH (ref 70–99)
Glucose-Capillary: 216 mg/dL — ABNORMAL HIGH (ref 70–99)

## 2017-10-25 LAB — TROPONIN I: Troponin I: <0.03 ng/mL (ref <0.03)

## 2017-10-25 LAB — FERRITIN: FERRITIN: 6 ng/mL — AB (ref 24–336)

## 2017-10-25 MED ORDER — AZITHROMYCIN 250 MG PO TABS
500.0000 mg | ORAL_TABLET | Freq: Every day | ORAL | Status: AC
  Administered 2017-10-25: 500 mg via ORAL
  Filled 2017-10-25: qty 2

## 2017-10-25 MED ORDER — AZITHROMYCIN 250 MG PO TABS
250.0000 mg | ORAL_TABLET | Freq: Every day | ORAL | Status: DC
  Administered 2017-10-26 – 2017-10-27 (×2): 250 mg via ORAL
  Filled 2017-10-25 (×2): qty 1

## 2017-10-25 MED ORDER — ACETAMINOPHEN 650 MG RE SUPP: 650.0000 mg | Freq: Four times a day (QID) | RECTAL | Status: DC | PRN

## 2017-10-25 MED ORDER — INSULIN ASPART 100 UNIT/ML ~~LOC~~ SOLN
0.0000 [IU] | Freq: Three times a day (TID) | SUBCUTANEOUS | Status: DC
  Administered 2017-10-26 (×2): 1 [IU] via SUBCUTANEOUS
  Administered 2017-10-26: 3 [IU] via SUBCUTANEOUS
  Administered 2017-10-27: 1 [IU] via SUBCUTANEOUS
  Filled 2017-10-25 (×4): qty 1

## 2017-10-25 MED ORDER — ENOXAPARIN SODIUM 40 MG/0.4ML ~~LOC~~ SOLN
40.0000 mg | SUBCUTANEOUS | Status: DC
  Administered 2017-10-25 – 2017-10-27 (×3): 40 mg via SUBCUTANEOUS
  Filled 2017-10-25 (×3): qty 0.4

## 2017-10-25 MED ORDER — IOHEXOL 350 MG/ML SOLN
75.0000 mL | Freq: Once | INTRAVENOUS | Status: AC | PRN
  Administered 2017-10-25: 75 mL via INTRAVENOUS

## 2017-10-25 MED ORDER — SODIUM CHLORIDE 0.9% FLUSH
3.0000 mL | Freq: Two times a day (BID) | INTRAVENOUS | Status: DC
  Administered 2017-10-25 – 2017-10-27 (×4): 3 mL via INTRAVENOUS

## 2017-10-25 MED ORDER — FERROUS SULFATE 325 (65 FE) MG PO TABS
325.0000 mg | ORAL_TABLET | Freq: Two times a day (BID) | ORAL | Status: DC
  Administered 2017-10-25 – 2017-10-27 (×4): 325 mg via ORAL
  Filled 2017-10-25 (×4): qty 1

## 2017-10-25 MED ORDER — TAMSULOSIN HCL 0.4 MG PO CAPS
0.4000 mg | ORAL_CAPSULE | Freq: Every day | ORAL | Status: DC
  Administered 2017-10-26 – 2017-10-27 (×2): 0.4 mg via ORAL
  Filled 2017-10-25 (×2): qty 1

## 2017-10-25 MED ORDER — NICOTINE 21 MG/24HR TD PT24
21.0000 mg | MEDICATED_PATCH | Freq: Every day | TRANSDERMAL | Status: DC
  Administered 2017-10-25 – 2017-10-27 (×3): 21 mg via TRANSDERMAL
  Filled 2017-10-25 (×3): qty 1

## 2017-10-25 MED ORDER — BENAZEPRIL HCL 20 MG PO TABS
20.0000 mg | ORAL_TABLET | Freq: Every day | ORAL | Status: DC
  Administered 2017-10-26 – 2017-10-27 (×2): 20 mg via ORAL
  Filled 2017-10-25 (×2): qty 1

## 2017-10-25 MED ORDER — ONDANSETRON HCL 4 MG PO TABS: 4.0000 mg | ORAL_TABLET | Freq: Four times a day (QID) | ORAL | Status: DC | PRN

## 2017-10-25 MED ORDER — FUROSEMIDE 40 MG PO TABS
20.0000 mg | ORAL_TABLET | Freq: Once | ORAL | Status: AC
  Administered 2017-10-25: 20 mg via ORAL
  Filled 2017-10-25: qty 1

## 2017-10-25 MED ORDER — IPRATROPIUM-ALBUTEROL 0.5-2.5 (3) MG/3ML IN SOLN
3.0000 mL | Freq: Four times a day (QID) | RESPIRATORY_TRACT | Status: DC
  Administered 2017-10-25 – 2017-10-27 (×8): 3 mL via RESPIRATORY_TRACT
  Filled 2017-10-25 (×7): qty 3

## 2017-10-25 MED ORDER — MONTELUKAST SODIUM 10 MG PO TABS
10.0000 mg | ORAL_TABLET | Freq: Every day | ORAL | Status: DC
  Administered 2017-10-25 – 2017-10-26 (×2): 10 mg via ORAL
  Filled 2017-10-25 (×2): qty 1

## 2017-10-25 MED ORDER — ONDANSETRON HCL 4 MG/2ML IJ SOLN: 4.0000 mg | Freq: Four times a day (QID) | INTRAMUSCULAR | Status: DC | PRN

## 2017-10-25 MED ORDER — PRAVASTATIN SODIUM 40 MG PO TABS
40.0000 mg | ORAL_TABLET | Freq: Every day | ORAL | Status: DC
  Administered 2017-10-25 – 2017-10-26 (×2): 40 mg via ORAL
  Filled 2017-10-25 (×2): qty 1

## 2017-10-25 MED ORDER — AMLODIPINE BESYLATE 5 MG PO TABS
5.0000 mg | ORAL_TABLET | Freq: Every day | ORAL | Status: DC
  Administered 2017-10-26 – 2017-10-27 (×2): 5 mg via ORAL
  Filled 2017-10-25 (×2): qty 1

## 2017-10-25 MED ORDER — IPRATROPIUM-ALBUTEROL 0.5-2.5 (3) MG/3ML IN SOLN
3.0000 mL | Freq: Once | RESPIRATORY_TRACT | Status: AC
  Administered 2017-10-25: 3 mL via RESPIRATORY_TRACT
  Filled 2017-10-25: qty 3

## 2017-10-25 MED ORDER — DOCUSATE SODIUM 100 MG PO CAPS
100.0000 mg | ORAL_CAPSULE | Freq: Two times a day (BID) | ORAL | Status: DC
  Administered 2017-10-25 – 2017-10-27 (×5): 100 mg via ORAL
  Filled 2017-10-25 (×5): qty 1

## 2017-10-25 MED ORDER — METHYLPREDNISOLONE SODIUM SUCC 40 MG IJ SOLR
40.0000 mg | Freq: Two times a day (BID) | INTRAMUSCULAR | Status: DC
  Administered 2017-10-26 – 2017-10-27 (×3): 40 mg via INTRAVENOUS
  Filled 2017-10-25 (×3): qty 1

## 2017-10-25 MED ORDER — BUDESONIDE 0.5 MG/2ML IN SUSP
0.5000 mg | Freq: Two times a day (BID) | RESPIRATORY_TRACT | Status: DC
  Administered 2017-10-25 – 2017-10-27 (×4): 0.5 mg via RESPIRATORY_TRACT
  Filled 2017-10-25 (×5): qty 2

## 2017-10-25 MED ORDER — ACETAMINOPHEN 325 MG PO TABS: 650.0000 mg | ORAL_TABLET | Freq: Four times a day (QID) | ORAL | Status: DC | PRN

## 2017-10-25 MED ORDER — INSULIN ASPART 100 UNIT/ML ~~LOC~~ SOLN
0.0000 [IU] | Freq: Every day | SUBCUTANEOUS | Status: DC
  Administered 2017-10-25: 2 [IU] via SUBCUTANEOUS
  Filled 2017-10-25: qty 1

## 2017-10-25 MED ORDER — METHYLPREDNISOLONE SODIUM SUCC 125 MG IJ SOLR
125.0000 mg | Freq: Once | INTRAMUSCULAR | Status: AC
  Administered 2017-10-25: 125 mg via INTRAVENOUS
  Filled 2017-10-25: qty 2

## 2017-10-25 NOTE — H&P (Signed)
[facility] [place] [facility]   PATIENT NAME: Douglas Mathis    MR#:  [id number]  DATE OF BIRTH:  September 19, 1935  DATE OF ADMISSION:  10/25/2017  PRIMARY CARE PHYSICIAN: [facility]   REQUESTING/REFERRING PHYSICIAN: Dr Presley Raddle  CHIEF COMPLAINT:   Chief Complaint  Patient presents with  . Chest Pain    HISTORY OF PRESENT ILLNESS:  Douglas Mathis  is a 82 y.o. male came in with chest pain.  He states the chest pain was 8 out of 10 in intensity both sides of his chest lasting about 4 to 5 minutes.  Associated with shortness of breath wheezing and cough.  He states that he has had a 20 pound weight loss and no appetite over the last 6 months.  States food does not have a good taste.  Currently the patient does not have any chest pain.  His cough is nonproductive.  He is wheezing.  In the ER he was found to be hypoxic pulse ox of 88%.  CT scan was negative for pulmonary embolism but did show severe COPD.  Hospitalist services were contacted for further evaluation.  PAST MEDICAL HISTORY:   Past Medical History:  Diagnosis Date  . Anemia   . BPH (benign prostatic hyperplasia)   . COPD (chronic obstructive pulmonary disease) (HCC)   . Hypercholesterolemia   . Hypertension     PAST SURGICAL HISTORY:   Past Surgical History:  Procedure Laterality Date  . cataracts    . ESOPHAGOGASTRODUODENOSCOPY N/A 07/10/2015   Procedure: ESOPHAGOGASTRODUODENOSCOPY (EGD);  Surgeon: Scot Jun, MD;  Location: [facility] ENDOSCOPY;  Service: Endoscopy;  Laterality: N/A;    SOCIAL HISTORY:   Social History   Tobacco Use  . Smoking status: Current Every Day Smoker    Packs/day: 1.00  . Smokeless tobacco: Never Used  Substance Use Topics  . Alcohol use: No    FAMILY HISTORY:   Family History  Problem Relation Age of Onset  . Diabetes Mother   . CAD Mother   . Bone cancer Brother   . Bronchitis Father     DRUG ALLERGIES:   No Known Allergies  REVIEW OF SYSTEMS:  CONSTITUTIONAL: No fever, fatigue or weakness.  Positive for weight loss 20 pounds.  No appetite.  Food has no taste. EYES: No blurred or double vision.  EARS, NOSE, AND THROAT: No tinnitus or ear pain. No sore throat RESPIRATORY: Positive for cough, shortness of breath, and wheezing.  No hemoptysis.  CARDIOVASCULAR: Positive for chest pain.  No orthopnea, edema.  GASTROINTESTINAL: No nausea, vomiting, diarrhea or abdominal pain. No blood in bowel movements GENITOURINARY: No dysuria, hematuria.  ENDOCRINE: No polyuria, nocturia,  HEMATOLOGY: No anemia, easy bruising or bleeding SKIN: Ankle swelling. MUSCULOSKELETAL: No joint pain or arthritis.   NEUROLOGIC: No tingling, numbness, weakness.  PSYCHIATRY: No anxiety or depression.   MEDICATIONS AT HOME:   Prior to Admission medications   Medication Sig Start Date End Date Taking? Authorizing Provider  amLODipine-benazepril (LOTREL) 10-20 MG capsule Take 1 capsule by mouth daily.   Yes [provider]  DOK 100 MG capsule Take 100 mg by mouth 2 (two) times daily. 09/28/17  Yes [provider]  ferrous sulfate 325 (65 FE) MG tablet Take 1 tablet (325 mg total) by mouth 2 (two) times daily with a meal. 07/22/15  Yes Lalisa Kiehn, MD  [facility] VITAMIN C 500 MG tablet Take 500 mg by mouth 2 (two) times daily. 09/22/17  Yes [provider]  lovastatin (MEVACOR) 40 MG tablet Take 40 mg by mouth at bedtime. 09/23/17  Yes [provider]  montelukast (SINGULAIR) 10 MG tablet Take 10 mg by mouth daily. 10/22/17  Yes [provider]  PROAIR HFA 108 (90 Base) MCG/ACT inhaler Inhale 2 puffs into the lungs 4 (four) times daily as needed for wheezing or shortness of breath.  08/04/17  Yes [provider]  tamsulosin (FLOMAX) 0.4 MG CAPS capsule Take 0.4 mg by mouth daily after breakfast.    Yes [provider]      VITAL SIGNS:  Blood pressure (!) 147/66,  pulse 81, temperature 98.2 F (36.8 C), temperature source Oral, resp. rate 16, weight 79.4 kg, SpO2 92 %.  PHYSICAL EXAMINATION:  GENERAL:  82 y.o.-year-old patient lying in the bed with acute distress.  EYES: Bilateral eyes clouding left greater than right. No scleral icterus. Extraocular muscles intact.  HEENT: Head atraumatic, normocephalic. Oropharynx and nasopharynx clear.  NECK:  Supple, no jugular venous distention. No thyroid enlargement, no tenderness.  LUNGS: Poor air entry.  Decreased breath sounds bilateral, positive inspiratory and expiratory wheezing, rales,rhonchi or crepitation.  Positive use of accessory muscles of respiration.  CARDIOVASCULAR: S1, S2 normal. No murmurs, rubs, or gallops.  ABDOMEN: Soft, nontender, nondistended. Bowel sounds present. No organomegaly or mass.  EXTREMITIES: Trace pedal edema.  No cyanosis, or clubbing.  NEUROLOGIC: Cranial nerves II through XII are intact. Muscle strength 5/5 in all extremities. Sensation intact. Gait not checked.  PSYCHIATRIC: The patient is alert and oriented x 3.  SKIN: No rash, lesion, or ulcer.   LABORATORY PANEL:   CBC Recent Labs  Lab 10/25/17 1009  WBC 11.1*  HGB 8.8*  HCT 27.6*  PLT 289   ------------------------------------------------------------------------------------------------------------------  Chemistries  Recent Labs  Lab 10/25/17 1009  NA 136  K 4.4  CL 99  CO2 27  GLUCOSE 118*  BUN 12  CREATININE 0.75  CALCIUM 7.9*  AST 23  ALT 17  ALKPHOS 61  BILITOT 0.6   ------------------------------------------------------------------------------------------------------------------  Cardiac Enzymes Recent Labs  Lab 10/25/17 1009  TROPONINI <0.03   ------------------------------------------------------------------------------------------------------------------  RADIOLOGY:  Dg Chest 2 View  Result Date: 10/25/2017 CLINICAL DATA:  82 year old male with chest pain and shortness of breath  for the past 2 days. EXAM: CHEST - 2 VIEW COMPARISON:  Prior chest x-ray 06/28/2017 FINDINGS: Progressive scarring and architectural distortion in the periphery of the left lower lobe compared to prior imaging. Persistent small bilateral pleural effusions. Background hyperinflation, bronchitic changes and interstitial prominence. Cardiac and mediastinal contours remain within normal limits. Atherosclerotic calcifications again noted in the transverse aorta. Chronic degenerative changes at both shoulder joints. No acute osseous abnormality. IMPRESSION: Progressive scarring and architectural distortion in the periphery of the left lower lobe compared to 06/28/2017 and 07/08/2015. It is difficult to exclude an underlying peripheral or pleural based lesion. Consider further evaluation with CT scan of the chest. Hyperinflation with bronchitic changes and chronic interstitial prominence suggests underlying COPD. Aortic Atherosclerosis (ICD10-170.0) Electronically Signed   By: Malachy Moan M.D.   On: 10/25/2017 10:33   Ct Angio Chest Pe W And/or Wo Contrast  Result Date: 10/25/2017 CLINICAL DATA:  82 year old male with chest pain and shortness of breath EXAM: CT ANGIOGRAPHY CHEST WITH CONTRAST TECHNIQUE: Multidetector CT imaging of the chest was performed using the standard protocol during bolus administration of intravenous contrast. Multiplanar CT image reconstructions and MIPs were obtained to evaluate the vascular anatomy. CONTRAST:  75mL OMNIPAQUE  IOHEXOL 350 MG/ML SOLN COMPARISON:  Prior chest x-ray 10/25/2017 FINDINGS: Cardiovascular: Excellent opacification of the pulmonary arteries to the proximal segmental level. No evidence of central filling defect to suggest acute pulmonary embolus. Conventional 3 vessel arch anatomy. Moderate heterogeneous atherosclerotic plaque throughout the normal caliber aorta. No aneurysm or dissection. Calcifications present along the left anterior descending, circumflex and right  coronary arteries. The heart remains normal in size. No pericardial effusion. Mediastinum/Nodes: Unremarkable CT appearance of the thyroid gland. No suspicious mediastinal or hilar adenopathy. No soft tissue mediastinal mass. The thoracic esophagus is unremarkable. Lungs/Pleura: Advanced combined paraseptal and centrilobular pulmonary emphysema. Linear pleuroparenchymal scarring and architectural distortion in the periphery of the inferior lingula. No underlying mass or pulmonary nodule. Calcified pleural plaques present on the left with chronic smooth pleural thickening. No nodularity. Upper Abdomen: Pneumobilia suggesting incompetence of the biliary sphincter. No acute abnormality in the visualized upper abdomen. Musculoskeletal: No chest wall abnormality. No acute or significant osseous findings. Review of the MIP images confirms the above findings. IMPRESSION: 1. Negative for acute pulmonary embolus, pneumonia or other acute cardiopulmonary process. 2. 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 3. Pleuroparenchymal scarring and architectural distortion in the periphery of the inferior lingula without underlying mass or nodule. 4. Chronic left pleural thickening and calcified pleural plaques. This combination of findings suggests either a remote prior empyema or hemothorax on the left. 5. Coronary artery calcifications. 6. Pneumobilia suggesting an incompetent biliary sphincter. Has the patient had prior ERCP? Aortic Atherosclerosis (ICD10-I70.0) and Emphysema (ICD10-J43.9). Electronically Signed   By: Malachy Moan M.D.   On: 10/25/2017 11:29    EKG:   Sinus tachycardia 101 bpm, right bundle branch block, left anterior fascicular block, PVCs  IMPRESSION AND PLAN:   1.  COPD exacerbation.  Nebulizer treatments with budesonide and DuoNeb.  Patient received Solu-Medrol 125 mg once.  I will give 40 mg IV twice daily. 2.  Acute hypoxic respiratory failure with a pulse ox of 88%.   Oxygen supplementation and try to taper off oxygen as quickly as possible. 3.  Essential hypertension.  Continue Lotrel at a lower dose. 4.  Hyperlipidemia unspecified on pravastatin 5.  BPH on Flomax 6.  Anemia.  Send off iron studies, guaiac stools.  Continue iron supplementation. 7.  Tobacco abuse.  Tobacco abuse counseling done 4 minutes by me.  Nicotine patch ordered. 8.  Impaired fasting glucose.  Check hemoglobin A1c put on sliding scale for right now  All the records are reviewed and case discussed with ED provider. Management plans discussed with the patient, and he is in agreement.  CODE STATUS: Full code  TOTAL TIME TAKING CARE OF THIS PATIENT: 50 minutes, including ACP time.    Alford Highland M.D on 10/25/2017 at 12:32 PM  Between 7am to 6pm - Pager - [phone]  After 6pm call admission pager [phone]  [facility] Office  [phone]  CC: Primary care physician; [facility]

## 2017-10-25 NOTE — ED Provider Notes (Signed)
[name]       Time seen: ----------------------------------------- 9:38 AM on 10/25/2017 -----------------------------------------   I have reviewed the triage vital signs and the nursing notes.  HISTORY   Chief Complaint No chief complaint on file.    HPI Douglas Mathis is a 82 y.o. male with a history of hypertension and hyperlipidemia who presents to the ED for chest pain shortness of breath.  EMS arrived to the patient's home where he was complaining of chest pain.  He was given an aspirin and nitroglycerin with some improvement in the pain.  He was also noted to have difficulty breathing and was hypoxic on room air.  He was given nasal cannula oxygen.  Patient describes lower extremity edema for several days.  He has never had these symptoms before, has never needed oxygen or had leg swelling.  Past Medical History:  Diagnosis Date  . Hypercholesterolemia   . Hypertension     Patient Active Problem List   Diagnosis Date Noted  . GI bleed 07/08/2015    Past Surgical History:  Procedure Laterality Date  . ESOPHAGOGASTRODUODENOSCOPY N/A 07/10/2015   Procedure: ESOPHAGOGASTRODUODENOSCOPY (EGD);  Surgeon: Scot Jun, MD;  Location: [facility] ENDOSCOPY;  Service: Endoscopy;  Laterality: N/A;    Allergies Patient has no known allergies.  Social History Social History   Tobacco Use  . Smoking status: Current Every Day Smoker    Packs/day: 1.00  . Smokeless tobacco: Never Used  Substance Use Topics  . Alcohol use: No  . Drug use: Never   Review of Systems Constitutional: Negative for fever. Cardiovascular: Positive for chest pain Respiratory: Positive for shortness of breath Gastrointestinal: Negative for abdominal pain, vomiting and diarrhea. Musculoskeletal: Negative for back pain.  Positive for edema Skin: Negative for rash. Neurological: Negative for headaches, focal weakness or  numbness.  All systems negative/normal/unremarkable except as stated in the HPI  ____________________________________________   PHYSICAL EXAM:  VITAL SIGNS: ED Triage Vitals  Enc Vitals Group     BP      Pulse      Resp      Temp      Temp src      SpO2      Weight      Height      Head Circumference      Peak Flow      Pain Score      Pain Loc      Pain Edu?      Excl. in GC?    Constitutional: Alert and oriented.  Mild distress Eyes: Conjunctivae are normal. Normal extraocular movements. ENT   Head: Normocephalic and atraumatic.   Nose: No congestion/rhinnorhea.   Mouth/Throat: Mucous membranes are moist.   Neck: No stridor. Cardiovascular: Normal rate, irregular rhythm. No murmurs, rubs, or gallops. Respiratory: Normal respiratory effort without tachypnea nor retractions.  Bibasilar rales Gastrointestinal: Soft and nontender. Normal bowel sounds Musculoskeletal: Nontender with normal range of motion in extremities.  Bilateral lower extremity edema is noted Neurologic:  Normal speech and language. No gross focal neurologic deficits are appreciated.  Skin:  Skin is warm, dry and intact. No rash noted. Psychiatric: Mood and affect are normal. Speech and behavior are normal.  ____________________________________________  EKG: Interpreted by me.  Sinus tachycardia with a rate of 101 bpm, PVCs, right bundle branch block, left anterior fascicular block  ____________________________________________  ED COURSE:  As part of my medical decision making, I reviewed the following  data within the electronic MEDICAL RECORD NUMBER History obtained from family if available, nursing notes, old chart and ekg, as well as notes from prior ED visits. Patient presented for chest pain shortness of breath, we will assess with labs and imaging as indicated at this time.   Procedures ____________________________________________   LABS (pertinent positives/negatives)  Labs  Reviewed  CBC WITH DIFFERENTIAL/PLATELET - Abnormal; Notable for the following components:      Result Value   WBC 11.1 (*)    RBC 3.49 (*)    Hemoglobin 8.8 (*)    HCT 27.6 (*)    MCV 79.0 (*)    MCH 25.2 (*)    MCHC 31.9 (*)    RDW 22.5 (*)    Neutro Abs 7.5 (*)    Basophils Absolute 0.2 (*)    All other components within normal limits  BRAIN NATRIURETIC PEPTIDE - Abnormal; Notable for the following components:   B Natriuretic Peptide 361.0 (*)    All other components within normal limits  COMPREHENSIVE METABOLIC PANEL - Abnormal; Notable for the following components:   Glucose, Bld 118 (*)    Calcium 7.9 (*)    Albumin 3.2 (*)    All other components within normal limits  TROPONIN I    RADIOLOGY Images were viewed by me  Chest x-ray/CTA IMPRESSION: 1. Negative for acute pulmonary embolus, pneumonia or other acute cardiopulmonary process. 2. 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 3. Pleuroparenchymal scarring and architectural distortion in the periphery of the inferior lingula without underlying mass or nodule. 4. Chronic left pleural thickening and calcified pleural plaques. This combination of findings suggests either a remote prior empyema or hemothorax on the left. 5. Coronary artery calcifications. 6. Pneumobilia suggesting an incompetent biliary sphincter. Has the patient had prior ERCP? ____________________________________________  CRITICAL CARE Performed by: Ulice Dash   Total critical care time: 30 minutes  Critical care time was exclusive of separately billable procedures and treating other patients.  Critical care was necessary to treat or prevent imminent or life-threatening deterioration.  Critical care was time spent personally by me on the following activities: development of treatment plan with patient and/or surrogate as well as nursing, discussions with consultants, evaluation of patient's response to  treatment, examination of patient, obtaining history from patient or surrogate, ordering and performing treatments and interventions, ordering and review of laboratory studies, ordering and review of radiographic studies, pulse oximetry and re-evaluation of patient's condition.   DIFFERENTIAL DIAGNOSIS   CHF, COPD, pneumonia, MI  FINAL ASSESSMENT AND PLAN  Emphysema, mild anemia, possible CHF, hypoxia   Plan: The patient had presented for planes of both chest pain shortness of breath that started this morning. Patient's labs revealed anemia and a slightly elevated BNP but otherwise no acute process. Patient's imaging was concerning for advanced emphysematous changes.  He has never been diagnosed with emphysema.  We have given him duo nebs and steroids as well as a small dose of Lasix.  There was no PE on CT imaging.  Patient still remains short of breath, I will discuss with the hospitalist for admission.   Ulice Dash, MD   Note: This note was generated in part or whole with voice recognition software. Voice recognition is usually quite accurate but there are transcription errors that can and very often do occur. I apologize for any typographical errors that were not detected and corrected.     Emily Filbert, MD 10/25/17 1140

## 2017-10-25 NOTE — Progress Notes (Signed)
Patient ID: JAWORSKI ROUND, male   DOB: 1935/04/02, 82 y.o.   MRN: [id number]  ACP note  Patient present  Diagnosis: COPD exacerbation, acute hypoxic respiratory failure, essential hypertension, hyperlipidemia, BPH, anemia, tobacco abuse, impaired fasting glucose  CODE STATUS discussed and patient wishes to be a DO NOT RESUSCITATE.  Plan.  Try to get breathing improved with nebulizer treatments with budesonide and DuoNeb and Solu-Medrol.  Try to taper off oxygen as quickly as possible.  Time spent on ACP discussion 17 minutes Dr. Alford Highland

## 2017-10-25 NOTE — ED Notes (Signed)
Pt continues to deny chest pain at this time.

## 2017-10-25 NOTE — ED Triage Notes (Signed)
Pt to ed via ems from home with c/o cp and sob that started this am.  Pt also reports increased swelling and fluid retention in bilat feet and ankles.  Pt also reports mild swelling to bilat hands.  EMS reports pt was 86% on RA at home.  Pt sats here on RA are 87%.  Pt placed on o2 at 2 lpm [place], sats up to 98%.  Pt alert and oriented, skin warm and dry,  IV in place per ems in right forearm, 20g, sl.  Blood drawn and sent to lab. Dr Mayford Knife at bedside on arrival.  Pt placed on CM, EKG done on arrival to ED.  VSS at this time. IV flushes well.  +2-3 pitting edema noted in lower extremities. Lungs clear bilat. Heart sounds noted s1 and S2.  Pt denies cardiac history.  Pt noted to be in a fib on monitor and multifocal PVCs noted on monitor.  Pt denies cp on arrival to ed. Pt states initially pain was central and radiated to left arm with mild diaphoresis and weakness and dizziness. Pt states EMS gave 1 nitro spray and 4 baby asa and the pain dropped from 8/10 to 2 /10.  Pt family at bedside at this time.

## 2017-10-26 LAB — ECHOCARDIOGRAM COMPLETE: Weight: 2956.8 oz

## 2017-10-26 LAB — BASIC METABOLIC PANEL
Anion gap: 8 (ref 5–15)
BUN: 14 mg/dL (ref 8–23)
CALCIUM: 8.7 mg/dL — AB (ref 8.9–10.3)
CO2: 30 mmol/L (ref 22–32)
Chloride: 97 mmol/L — ABNORMAL LOW (ref 98–111)
Creatinine, Ser: 0.65 mg/dL (ref 0.61–1.24)
GFR calc non Af Amer: >60 mL/min (ref >60)
GLUCOSE: 152 mg/dL — AB (ref 70–99)
POTASSIUM: 4.2 mmol/L (ref 3.5–5.1)
Sodium: 135 mmol/L (ref 135–145)

## 2017-10-26 LAB — GLUCOSE, CAPILLARY
GLUCOSE-CAPILLARY: 141 mg/dL — AB (ref 70–99)
GLUCOSE-CAPILLARY: 214 mg/dL — AB (ref 70–99)
GLUCOSE-CAPILLARY: 153 mg/dL — AB (ref 70–99)
Glucose-Capillary: 135 mg/dL — ABNORMAL HIGH (ref 70–99)

## 2017-10-26 LAB — VITAMIN B12: Vitamin B-12: 657 pg/mL (ref 180–914)

## 2017-10-26 LAB — HEMOGLOBIN A1C
HEMOGLOBIN A1C: 6.3 % — AB (ref 4.8–5.6)
Mean Plasma Glucose: 134.11 mg/dL

## 2017-10-26 LAB — CBC
HEMATOCRIT: 29.1 % — AB (ref 40.0–52.0)
Hemoglobin: 9.4 g/dL — ABNORMAL LOW (ref 13.0–18.0)
MCH: 25.4 pg — AB (ref 26.0–34.0)
MCHC: 32.4 g/dL (ref 32.0–36.0)
MCV: 78.4 fL — ABNORMAL LOW (ref 80.0–100.0)
Platelets: 312 10*3/uL (ref 150–440)
RBC: 3.71 MIL/uL — AB (ref 4.40–5.90)
RDW: 21.7 % — ABNORMAL HIGH (ref 11.5–14.5)
WBC: 6.6 10*3/uL (ref 3.8–10.6)

## 2017-10-26 NOTE — Progress Notes (Signed)
Pt refused 0200 hr tx 

## 2017-10-26 NOTE — Progress Notes (Addendum)
[employer] - [place] at [facility]   PATIENT NAME: Douglas Mathis    MR#:  [id number]  DATE OF BIRTH:  March 03, 1935  SUBJECTIVE:  CHIEF COMPLAINT:   Chief Complaint  Patient presents with  . Chest Pain   Better shortness of breath and cough.  On oxygen 3 L per nasal cannula. REVIEW OF SYSTEMS:  Review of Systems  Constitutional: Negative for chills, fever and malaise/fatigue.  HENT: Negative for sore throat.   Eyes: Negative for blurred vision and double vision.  Respiratory: Positive for cough, shortness of breath and wheezing. Negative for hemoptysis, sputum production and stridor.   Cardiovascular: Negative for chest pain, palpitations, orthopnea and leg swelling.  Gastrointestinal: Negative for abdominal pain, blood in stool, diarrhea, melena, nausea and vomiting.  Genitourinary: Negative for dysuria, flank pain and hematuria.  Musculoskeletal: Negative for back pain and joint pain.  Skin: Negative for rash.  Neurological: Negative for dizziness, sensory change, focal weakness, seizures, loss of consciousness, weakness and headaches.  Endo/Heme/Allergies: Negative for polydipsia.  Psychiatric/Behavioral: Negative for depression. The patient is not nervous/anxious.     DRUG ALLERGIES:  No Known Allergies VITALS:  Blood pressure 124/80, pulse 70, temperature 99 F (37.2 C), temperature source Oral, resp. rate 17, height 5\' 9"  (1.753 m), weight 81.3 kg, SpO2 95 %. PHYSICAL EXAMINATION:  Physical Exam  Constitutional: He is oriented to person, place, and time.  HENT:  Head: Normocephalic.  Mouth/Throat: Oropharynx is clear and moist.  Eyes: Pupils are equal, round, and reactive to light. Conjunctivae and EOM are normal. No scleral icterus.  Neck: Normal range of motion. Neck supple. No JVD present. No tracheal deviation present.  Cardiovascular: Normal rate, regular rhythm and normal heart sounds. Exam reveals no gallop.  No murmur  heard. Pulmonary/Chest: Effort normal. No respiratory distress. He has no wheezes. He has no rales.  Diminished breath sounds  Abdominal: Soft. Bowel sounds are normal. He exhibits no distension. There is no tenderness. There is no rebound.  Musculoskeletal: Normal range of motion. He exhibits no edema or tenderness.  Neurological: He is alert and oriented to person, place, and time. No cranial nerve deficit.  Skin: No rash noted. No erythema.   LABORATORY PANEL:  Male CBC Recent Labs  Lab 10/26/17 0319  WBC 6.6  HGB 9.4*  HCT 29.1*  PLT 312   ------------------------------------------------------------------------------------------------------------------ Chemistries  Recent Labs  Lab 10/25/17 1206 10/26/17 0319  NA  --  135  K  --  4.2  CL  --  97*  CO2  --  30  GLUCOSE  --  152*  BUN  --  14  CREATININE  --  0.65  CALCIUM  --  8.7*  AST 28  --   ALT 14  --   ALKPHOS 60  --   BILITOT 0.6  --    RADIOLOGY:  No results found. ASSESSMENT AND PLAN:   1.  Acute respiratory failure with hypoxia due to COPD exacerbation.   Try to wean off oxygen.  Continue steroids, nebulizer treatments with budesonide and DuoNeb.  Robitussin as needed.  3.  Essential hypertension.  Continue Lotrel at a lower dose. 4.  Hyperlipidemia unspecified on pravastatin 5.  BPH on Flomax 6.  Anemia, iron deficiency. Continue iron supplementation. 7.  Tobacco abuse.  Tobacco abuse counseling done 4 minutes.  Nicotine patch.. 8. DMT2.  Diagnosis, hemoglobin A1c 6.3, on sliding scale.  All the records are reviewed and case discussed with Care Management/Social Worker. Management  plans discussed with the patient, his daughter and they are in agreement.  CODE STATUS: DNR  TOTAL TIME TAKING CARE OF THIS PATIENT: 33 minutes.   More than 50% of the time was spent in counseling/coordination of care: YES  POSSIBLE D/C IN 2 DAYS, DEPENDING ON CLINICAL CONDITION.   Shaune Pollack M.D on 10/26/2017 at  1:18 PM  Between 7am to 6pm - Pager - [phone]  After 6pm go to [email or web address] - Therapist, nutritional Hospitalists

## 2017-10-27 LAB — OCCULT BLOOD X 1 CARD TO LAB, STOOL: Fecal Occult Bld: POSITIVE — AB

## 2017-10-27 LAB — GLUCOSE, CAPILLARY
GLUCOSE-CAPILLARY: 112 mg/dL — AB (ref 70–99)
GLUCOSE-CAPILLARY: 238 mg/dL — AB (ref 70–99)
Glucose-Capillary: 150 mg/dL — ABNORMAL HIGH (ref 70–99)

## 2017-10-27 MED ORDER — IPRATROPIUM-ALBUTEROL 0.5-2.5 (3) MG/3ML IN SOLN: 3.0000 mL | Freq: Four times a day (QID) | RESPIRATORY_TRACT | 0 refills | Status: DC | PRN

## 2017-10-27 MED ORDER — AZITHROMYCIN 250 MG PO TABS: ORAL_TABLET | ORAL | 0 refills | Status: DC

## 2017-10-27 MED ORDER — PREDNISONE 50 MG PO TABS: ORAL_TABLET | ORAL | 0 refills | Status: DC

## 2017-10-27 MED ORDER — NICOTINE 21 MG/24HR TD PT24: 21.0000 mg | MEDICATED_PATCH | Freq: Every day | TRANSDERMAL | 0 refills | Status: DC

## 2017-10-27 NOTE — Care Management Note (Signed)
Case Management Note  Patient Details  Name: Douglas Mathis MRN: [id number] Date of Birth: 1935-12-16  Subjective/Objective:    Patient discharge ordered.  Patient lives at home alone and is independent.  Chronic COPD with exacerbation.  Will be discharged on home O2 and nebulizer.  Requiring 2L.  Patient chose [facility] supplies.  [facility] accepted and aware.  Daughter, Douglas Mathis present in room and states she and her sister are able to help him with his needs and does not need nursing care in the home.  No further needs at this time.   Action/Plan: Will be discharged on home O2 and nebulizer.  Patient chose [facility] for supplies.  [facility] accepted and aware.   Expected Discharge Date:  10/27/17               Expected Discharge Plan:  Home w Home Health Services  In-House Referral:     Discharge planning Services  CM Consult  Post Acute Care Choice:  Durable Medical Equipment Choice offered to:  Patient  DME Arranged:  Oxygen, Nebulizer machine DME Agency:  [facility]  HH Arranged:  RN [facility] Agency:  [facility]  Status of Service:  Completed, signed off  If discussed at Long Length of Stay Meetings, dates discussed:    Additional Comments:  Sherren Kerns, RN 10/27/2017, 1:42 PM

## 2017-10-27 NOTE — Progress Notes (Signed)
MD Shridiran notified that pt's fecal occult blood was positive. No new orders received.

## 2017-10-27 NOTE — Discharge Summary (Signed)
[facility] - [place] at [facility]   PATIENT NAME: Douglas Mathis    MR#:  [id number]  DATE OF BIRTH:  Apr 27, 1935  DATE OF ADMISSION:  10/25/2017 ADMITTING PHYSICIAN: Alford Highland, MD  DATE OF DISCHARGE: No discharge date for patient encounter.  PRIMARY CARE PHYSICIAN: [facility], [facility]    ADMISSION DIAGNOSIS:  Hypoxia [R09.02] Nonspecific chest pain [R07.9] Pulmonary emphysema, unspecified emphysema type (HCC) [J43.9]  DISCHARGE DIAGNOSIS:  Active Problems:   COPD exacerbation (HCC)   SECONDARY DIAGNOSIS:   Past Medical History:  Diagnosis Date  . Anemia   . BPH (benign prostatic hyperplasia)   . COPD (chronic obstructive pulmonary disease) (HCC)   . Hypercholesterolemia   . Hypertension     HOSPITAL COURSE:   1.  Acute on chronic hypoxic respiratory failure  due toCOPD exacerbation Resolved Patient failed to wean off oxygen, 87% on room air with ambulation, discharged home on home O2, to follow-up with pulmonology status post discharge for reevaluation 1 week  2.  Acute on COPD exacerbation  Improved  Treated with aggressive pulmonary toilet, IV Solu-Medrol, mucolytic agents, rest tori therapy did see patient while in house, and patient did well   3. Essential hypertension Stable on current regiment  4. Hyperlipidemia Continue pravastatin  5. BPH  Continue on Flomax  6. Anemia, iron deficiency Stable Continue iron supplementation.  7. Tobacco abuse ProvidedTobacco abuse counseling while in house and Nicotine patch. . 8. DMT2 Stable on current regiment Treated with sliding scale insulin while in house  DISCHARGE CONDITIONS:   stable  CONSULTS OBTAINED:    DRUG ALLERGIES:  No Known Allergies  DISCHARGE MEDICATIONS:   Allergies as of 10/27/2017   No Known Allergies     Medication List    TAKE these medications   amLODipine-benazepril 10-20 MG capsule Commonly known as:   LOTREL Take 1 capsule by mouth daily.   azithromycin 250 MG tablet Commonly known as:  ZITHROMAX 1 p.o. daily Start taking on:  10/28/2017   DOK 100 MG capsule Generic drug:  docusate sodium Take 100 mg by mouth 2 (two) times daily.   ferrous sulfate 325 (65 FE) MG tablet Take 1 tablet (325 mg total) by mouth 2 (two) times daily with a meal.   GNP VITAMIN C 500 MG tablet Generic drug:  ascorbic acid Take 500 mg by mouth 2 (two) times daily.   ipratropium-albuterol 0.5-2.5 (3) MG/3ML Soln Commonly known as:  DUONEB Take 3 mLs by nebulization every 6 (six) hours as needed.   lovastatin 40 MG tablet Commonly known as:  MEVACOR Take 40 mg by mouth at bedtime.   montelukast 10 MG tablet Commonly known as:  SINGULAIR Take 10 mg by mouth daily.   nicotine 21 mg/24hr patch Commonly known as:  NICODERM CQ - dosed in mg/24 hours Place 1 patch (21 mg total) onto the skin daily. Start taking on:  10/28/2017   predniSONE 50 MG tablet Commonly known as:  DELTASONE 1 p.o. daily   PROAIR HFA 108 (90 Base) MCG/ACT inhaler Generic drug:  albuterol Inhale 2 puffs into the lungs 4 (four) times daily as needed for wheezing or shortness of breath.   tamsulosin 0.4 MG Caps capsule Commonly known as:  FLOMAX Take 0.4 mg by mouth daily after breakfast.            Durable Medical Equipment  (From admission, onward)         Start     Ordered  10/27/17 1148  DME Oxygen  Once    Comments:  COPD Acute on chronic hypoxic respiratory failure  Question Answer Comment  Mode or (Route) Nasal cannula   Liters per Minute 2   Frequency Continuous (stationary and portable oxygen unit needed)   Oxygen conserving device Yes   Oxygen delivery system Gas      10/27/17 1149   10/27/17 0000  DME Nebulizer machine    Question:  Patient needs a nebulizer to treat with the following condition  Answer:  COPD (chronic obstructive pulmonary disease) (HCC)   10/27/17 1149           DISCHARGE  INSTRUCTIONS:  If you experience worsening of your admission symptoms, develop shortness of breath, life threatening emergency, suicidal or homicidal thoughts you must seek medical attention immediately by calling 911 or calling your MD immediately  if symptoms less severe.  You Must read complete instructions/literature along with all the possible adverse reactions/side effects for all the Medicines you take and that have been prescribed to you. Take any new Medicines after you have completely understood and accept all the possible adverse reactions/side effects.   Please note  You were cared for by a hospitalist during your hospital stay. If you have any questions about your discharge medications or the care you received while you were in the hospital after you are discharged, you can call the unit and asked to speak with the hospitalist on call if the hospitalist that took care of you is not available. Once you are discharged, your primary care physician will handle any further medical issues. Please note that NO REFILLS for any discharge medications will be authorized once you are discharged, as it is imperative that you return to your primary care physician (or establish a relationship with a primary care physician if you do not have one) for your aftercare needs so that they can reassess your need for medications and monitor your lab values.    Today   CHIEF COMPLAINT:   Chief Complaint  Patient presents with  . Chest Pain    HISTORY OF PRESENT ILLNESS:   82 y.o. male came in with chest pain.  He states the chest pain was 8 out of 10 in intensity both sides of his chest lasting about 4 to 5 minutes.  Associated with shortness of breath wheezing and cough.  He states that he has had a 20 pound weight loss and no appetite over the last 6 months.  States food does not have a good taste.  Currently the patient does not have any chest pain.  His cough is nonproductive.  He is wheezing.  In the  ER he was found to be hypoxic pulse ox of 88%.  CT scan was negative for pulmonary embolism but did show severe COPD.  Hospitalist services were contacted for further evaluation.  VITAL SIGNS:  Blood pressure (!) 143/74, pulse 91, temperature 98 F (36.7 C), temperature source Oral, resp. rate 17, height 5\' 9"  (1.753 m), weight 81.3 kg, SpO2 92 %.  I/O:    Intake/Output Summary (Last 24 hours) at 10/27/2017 1150 Last data filed at 10/27/2017 1029 Gross per 24 hour  Intake 483 ml  Output 1850 ml  Net -1367 ml    PHYSICAL EXAMINATION:  GENERAL:  82 y.o.-year-old patient lying in the bed with no acute distress.  EYES: Pupils equal, round, reactive to light and accommodation. No scleral icterus. Extraocular muscles intact.  HEENT: Head atraumatic, normocephalic. Oropharynx and nasopharynx  clear.  NECK:  Supple, no jugular venous distention. No thyroid enlargement, no tenderness.  LUNGS: Normal breath sounds bilaterally, no wheezing, rales,rhonchi or crepitation. No use of accessory muscles of respiration.  CARDIOVASCULAR: S1, S2 normal. No murmurs, rubs, or gallops.  ABDOMEN: Soft, non-tender, non-distended. Bowel sounds present. No organomegaly or mass.  EXTREMITIES: No pedal edema, cyanosis, or clubbing.  NEUROLOGIC: Cranial nerves II through XII are intact. Muscle strength 5/5 in all extremities. Sensation intact. Gait not checked.  PSYCHIATRIC: The patient is alert and oriented x 3.  SKIN: No obvious rash, lesion, or ulcer.   DATA REVIEW:   CBC Recent Labs  Lab 10/26/17 0319  WBC 6.6  HGB 9.4*  HCT 29.1*  PLT 312    Chemistries  Recent Labs  Lab 10/25/17 1206 10/26/17 0319  NA  --  135  K  --  4.2  CL  --  97*  CO2  --  30  GLUCOSE  --  152*  BUN  --  14  CREATININE  --  0.65  CALCIUM  --  8.7*  AST 28  --   ALT 14  --   ALKPHOS 60  --   BILITOT 0.6  --     Cardiac Enzymes Recent Labs  Lab 10/25/17 1009  TROPONINI <0.03    Microbiology Results  Results  for orders placed or performed during the hospital encounter of 07/08/15  MRSA PCR Screening     Status: None   Collection Time: 07/09/15  2:03 AM  Result Value Ref Range Status   MRSA by PCR NEGATIVE NEGATIVE Final    Comment:        The GeneXpert MRSA Assay (FDA approved for NASAL specimens only), is one component of a comprehensive MRSA colonization surveillance program. It is not intended to diagnose MRSA infection nor to guide or monitor treatment for MRSA infections.     RADIOLOGY:  No results found.  EKG:   Orders placed or performed during the hospital encounter of 10/25/17  . ED EKG  . ED EKG  . EKG 12-Lead  . EKG 12-Lead      Management plans discussed with the patient, family and they are in agreement.  CODE STATUS:     Code Status Orders  (From admission, onward)         Start     Ordered   10/25/17 1225  Do not attempt resuscitation (DNR)  Continuous    Question Answer Comment  In the event of cardiac or respiratory ARREST Do not call a "code blue"   In the event of cardiac or respiratory ARREST Do not perform Intubation, CPR, defibrillation or ACLS   In the event of cardiac or respiratory ARREST Use medication by any route, position, wound care, and other measures to relive pain and suffering. May use oxygen, suction and manual treatment of airway obstruction as needed for comfort.   Comments nurse may pronounce      10/25/17 1224        Code Status History    Date Active Date Inactive Code Status Order ID Comments User Context   07/08/2015 2030 07/11/2015 1358 DNR [id number]  Altamese Dilling, MD Inpatient      TOTAL TIME TAKING CARE OF THIS PATIENT: 45 minutes.    Evelena Asa Douglas Mathis M.D on 10/27/2017 at 11:50 AM  Between 7am to 6pm - Pager - [phone]  After 6pm go to [email or web address] - password [email or web address]  [employer]  [phone]  CC: Primary care physician; [facility], [facility]   Note: This dictation was prepared with Nurse, children's dictation along with smaller phrase technology. Any transcriptional errors that result from this process are unintentional.

## 2017-10-27 NOTE — Progress Notes (Signed)
SATURATION QUALIFICATIONS: (This note is used to comply with regulatory documentation for home oxygen)  Patient Saturations on Room Air at Rest = 92%  Patient Saturations on Room Air while Ambulating = 87%  Patient Saturations on 2 Liters of oxygen while Ambulating = 93%  Please briefly explain why patient needs home oxygen: Pt desats with ambulation

## 2017-10-27 NOTE — Plan of Care (Signed)

## 2017-11-12 ENCOUNTER — Other Ambulatory Visit: Payer: Self-pay | Admitting: Family Medicine

## 2017-11-12 ENCOUNTER — Ambulatory Visit
Admission: RE | Admit: 2017-11-12 | Discharge: 2017-11-12 | Disposition: A | Discharge status: Home / Self Care | Payer: Medicare Other | Source: Ambulatory Visit | Attending: Family Medicine | Admitting: Family Medicine

## 2017-11-12 DIAGNOSIS — J984 Other disorders of lung: Secondary | ICD-10-CM | POA: Diagnosis not present

## 2017-11-12 DIAGNOSIS — J449 Chronic obstructive pulmonary disease, unspecified: Secondary | ICD-10-CM

## 2018-01-17 ENCOUNTER — Encounter: Payer: Self-pay | Admitting: *Deleted

## 2018-01-18 ENCOUNTER — Encounter
Admission: RE | Disposition: A | Discharge status: Home / Self Care | Payer: Self-pay | Source: Ambulatory Visit | Attending: Internal Medicine

## 2018-01-18 ENCOUNTER — Encounter: Payer: Self-pay | Admitting: Anesthesiology

## 2018-01-18 ENCOUNTER — Ambulatory Visit: Payer: Medicare Other | Admitting: Certified Registered Nurse Anesthetist

## 2018-01-18 ENCOUNTER — Ambulatory Visit
Admission: RE | Admit: 2018-01-18 | Discharge: 2018-01-18 | Disposition: A | Discharge status: Home / Self Care | Payer: Medicare Other | Source: Ambulatory Visit | Attending: Internal Medicine | Admitting: Internal Medicine

## 2018-01-18 DIAGNOSIS — R195 Other fecal abnormalities: Secondary | ICD-10-CM | POA: Insufficient documentation

## 2018-01-18 DIAGNOSIS — F17200 Nicotine dependence, unspecified, uncomplicated: Secondary | ICD-10-CM | POA: Insufficient documentation

## 2018-01-18 DIAGNOSIS — Z7951 Long term (current) use of inhaled steroids: Secondary | ICD-10-CM | POA: Insufficient documentation

## 2018-01-18 DIAGNOSIS — K2970 Gastritis, unspecified, without bleeding: Secondary | ICD-10-CM | POA: Diagnosis not present

## 2018-01-18 DIAGNOSIS — J449 Chronic obstructive pulmonary disease, unspecified: Secondary | ICD-10-CM | POA: Diagnosis not present

## 2018-01-18 DIAGNOSIS — N400 Enlarged prostate without lower urinary tract symptoms: Secondary | ICD-10-CM | POA: Insufficient documentation

## 2018-01-18 DIAGNOSIS — I10 Essential (primary) hypertension: Secondary | ICD-10-CM | POA: Diagnosis not present

## 2018-01-18 DIAGNOSIS — K5710 Diverticulosis of small intestine without perforation or abscess without bleeding: Secondary | ICD-10-CM | POA: Insufficient documentation

## 2018-01-18 DIAGNOSIS — E7800 Pure hypercholesterolemia, unspecified: Secondary | ICD-10-CM | POA: Diagnosis not present

## 2018-01-18 DIAGNOSIS — D500 Iron deficiency anemia secondary to blood loss (chronic): Secondary | ICD-10-CM | POA: Insufficient documentation

## 2018-01-18 DIAGNOSIS — K269 Duodenal ulcer, unspecified as acute or chronic, without hemorrhage or perforation: Secondary | ICD-10-CM | POA: Insufficient documentation

## 2018-01-18 DIAGNOSIS — B9681 Helicobacter pylori [H. pylori] as the cause of diseases classified elsewhere: Secondary | ICD-10-CM | POA: Diagnosis not present

## 2018-01-18 DIAGNOSIS — Z79899 Other long term (current) drug therapy: Secondary | ICD-10-CM | POA: Diagnosis not present

## 2018-01-18 DIAGNOSIS — D509 Iron deficiency anemia, unspecified: Secondary | ICD-10-CM | POA: Diagnosis present

## 2018-01-18 HISTORY — PX: ESOPHAGOGASTRODUODENOSCOPY: SHX5428

## 2018-01-18 SURGERY — EGD (ESOPHAGOGASTRODUODENOSCOPY)
Anesthesia: General

## 2018-01-18 MED ORDER — PROPOFOL 10 MG/ML IV BOLUS
INTRAVENOUS | Status: DC | PRN
  Administered 2018-01-18: 70 mg via INTRAVENOUS

## 2018-01-18 MED ORDER — SODIUM CHLORIDE 0.9 % IV SOLN
INTRAVENOUS | Status: DC
  Administered 2018-01-18: 1000 mL via INTRAVENOUS

## 2018-01-18 MED ORDER — PROPOFOL 500 MG/50ML IV EMUL
INTRAVENOUS | Status: DC | PRN
  Administered 2018-01-18: 150 ug/kg/min via INTRAVENOUS

## 2018-01-18 MED ORDER — LIDOCAINE HCL (CARDIAC) PF 100 MG/5ML IV SOSY
PREFILLED_SYRINGE | INTRAVENOUS | Status: DC | PRN
  Administered 2018-01-18: 100 mg via INTRAVENOUS

## 2018-01-18 NOTE — Interval H&P Note (Signed)
History and Physical Interval Note:  01/18/2018 11:28 AM  Douglas Mathis  has presented today for surgery, with the diagnosis of HX.IDA,HEME+ STOOLS  The various methods of treatment have been discussed with the patient and family. After consideration of risks, benefits and other options for treatment, the patient has consented to  Procedure(s): ESOPHAGOGASTRODUODENOSCOPY (EGD) (N/A) as a surgical intervention .  The patient's history has been reviewed, patient examined, no change in status, stable for surgery.  I have reviewed the patient's chart and labs.  Questions were answered to the patient's satisfaction.     Linnell Camp, [place]

## 2018-01-18 NOTE — Op Note (Signed)
[facility] Gastroenterology Patient Name: Douglas Mathis Procedure Date: 01/18/2018 11:22 AM MRN: [id number] Account #: [id number] Date of Birth: 27-Jul-1935 Admit Type: Outpatient Age: 82 Room: [facility] ENDO ROOM 2 Gender: Male Note Status: Finalized Procedure:            Upper GI endoscopy Indications:          Iron deficiency anemia secondary to chronic blood loss,                        Heme positive stool Providers:            Boykin Nearing. [name] MD, MD Referring MD:         [facility] (Referring MD) Medicines:            Propofol per Anesthesia Complications:        No immediate complications. Procedure:            Pre-Anesthesia Assessment:                       - The risks and benefits of the procedure and the                        sedation options and risks were discussed with the                        patient. All questions were answered and informed                        consent was obtained.                       - Patient identification and proposed procedure were                        verified prior to the procedure by the nurse. The                        procedure was verified in the procedure room.                       - ASA Grade Assessment: III - A patient with severe                        systemic disease.                       - After reviewing the risks and benefits, the patient                        was deemed in satisfactory condition to undergo the                        procedure.                       After obtaining informed consent, the endoscope was                        passed under direct vision. Throughout the procedure,  the patient's blood pressure, pulse, and oxygen                        saturations were monitored continuously. The Endoscope                        was introduced through the mouth, and advanced to the                        third part of duodenum. The upper GI  endoscopy was                        accomplished without difficulty. The patient tolerated                        the procedure well. Findings:      The examined esophagus was normal.      Localized minimal inflammation characterized by erosions and erythema       was found in the gastric body. Biopsies were taken with a cold forceps       for Helicobacter pylori testing.      The cardia and gastric fundus were normal on retroflexion.      A few localized erosions without bleeding were found in the second       portion of the duodenum.      A small non-bleeding diverticulum was found in the second portion of the       duodenum.      The exam was otherwise without abnormality. Impression:           - Normal esophagus.                       - Gastritis. Biopsied.                       - Duodenal erosions without bleeding.                       - Non-bleeding duodenal diverticulum.                       - The examination was otherwise normal. Recommendation:       - Await pathology results.                       - Patient has a contact number available for                        emergencies. The signs and symptoms of potential                        delayed complications were discussed with the patient.                        Return to normal activities tomorrow. Written discharge                        instructions were provided to the patient.                       - Resume previous diet.                       -  Continue present medications.                       - Return to nurse practitioner in 4 weeks.                       - The findings and recommendations were discussed with                        the patient and their family. Procedure Code(s):    --- Professional ---                       [phone], Esophagogastroduodenoscopy, flexible, transoral;                        with biopsy, single or multiple Diagnosis Code(s):    --- Professional ---                       K57.10,  Diverticulosis of small intestine without                        perforation or abscess without bleeding                       R19.5, Other fecal abnormalities                       D50.0, Iron deficiency anemia secondary to blood loss                        (chronic)                       K26.9, Duodenal ulcer, unspecified as acute or chronic,                        without hemorrhage or perforation                       K29.70, Gastritis, unspecified, without bleeding CPT copyright 2018 American [employer] Association. All rights reserved. The codes documented in this report are preliminary and upon coder review may  be revised to meet current compliance requirements. Stanton Kidney MD, MD 01/18/2018 11:44:30 AM This report has been signed electronically. Number of Addenda: 0 Note Initiated On: 01/18/2018 11:22 AM      [facility]

## 2018-01-18 NOTE — Anesthesia Post-op Follow-up Note (Signed)
Anesthesia QCDR form completed.        

## 2018-01-18 NOTE — H&P (Signed)
Outpatient short stay form Pre-procedure 01/18/2018 11:26 AM Douglas Mathis K. [name], M.D.  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  He has a history of remote duodenal ulcers in 2017 diagnosed by Dr. Lynnae Prude of our group by upper endoscopy.  Patient did not undergo a colonoscopy at the time.  He is rescheduled for another endoscopy today given presumptive NSAID gastropathy versus ulceration.  Colonoscopy is still yet to be scheduled, however, EGD has been advocated and scheduled first.    Current Facility-Administered Medications:  .  0.9 %  sodium chloride infusion, , Intravenous, Continuous, [place], Boykin Nearing, MD, Last Rate: 20 mL/hr at 01/18/18 1044, 1,000 mL at 01/18/18 1044  Medications Prior to Admission  Medication Sig Dispense Refill Last Dose  . amlodipine-atorvastatin (CADUET) 10-20 MG tablet Take 1 tablet by mouth daily.   01/18/2018 at 0800  . DOK 100 MG capsule Take 100 mg by mouth 2 (two) times daily.  5 Past Week at Unknown time  . ferrous sulfate 325 (65 FE) MG tablet Take 1 tablet (325 mg total) by mouth 2 (two) times daily with a meal. 60 tablet 0 01/18/2018 at 0800  . Fluticasone-Salmeterol (ADVAIR DISKUS IN) Inhale 1 puff into the lungs 2 (two) times daily.   01/18/2018 at 0800  . GNP VITAMIN C 500 MG tablet Take 500 mg by mouth 2 (two) times daily.  3 01/18/2018 at 0800  . ipratropium-albuterol (DUONEB) 0.5-2.5 (3) MG/3ML SOLN Take 3 mLs by nebulization every 6 (six) hours as needed. 360 mL 0 Past Week at Unknown time  . lovastatin (MEVACOR) 40 MG tablet Take 40 mg by mouth at bedtime.  0 01/18/2018 at 0800  . montelukast (SINGULAIR) 10 MG tablet Take 10 mg by mouth daily.   Past Month at Unknown time  . PROAIR HFA 108 (90 Base)  MCG/ACT inhaler Inhale 2 puffs into the lungs 4 (four) times daily as needed for wheezing or shortness of breath.   3 01/17/2018 at Unknown time  . tamsulosin (FLOMAX) 0.4 MG CAPS capsule Take 0.4 mg by mouth daily after breakfast.    Past Week at Unknown time  . azithromycin (ZITHROMAX) 250 MG tablet 1 p.o. daily (Patient not taking: Reported on 01/18/2018) 5 each 0 Not Taking at Unknown time  . nicotine (NICODERM CQ - DOSED IN MG/24 HOURS) 21 mg/24hr patch Place 1 patch (21 mg total) onto the skin daily. (Patient not taking: Reported on 01/18/2018) 28 patch 0 Not Taking at Unknown time  . predniSONE (DELTASONE) 50 MG tablet 1 p.o. daily 5 tablet 0      No Known Allergies   Past Medical History:  Diagnosis Date  . Anemia   . BPH (benign prostatic hyperplasia)   . COPD (chronic obstructive pulmonary disease) (HCC)   . Hypercholesterolemia   . Hypertension     Review of systems:  Otherwise negative.    Physical Exam  Gen: Alert, oriented. Appears stated age.  HEENT: [facility]/AT. PERRLA. Lungs: CTA, no wheezes. CV: RR nl S1, S2. Abd: soft, benign, no masses. BS+ Ext: No edema. Pulses 2+    Planned procedures: Proceed with EGD. The patient understands the nature of the planned procedure, indications, risks, alternatives and potential complications including but not limited to bleeding, infection, perforation, damage to internal organs and  possible oversedation/side effects from anesthesia. The patient agrees and gives consent to proceed.  Please refer to procedure notes for findings, recommendations and patient disposition/instructions.     Amrit Erck K. [name], M.D. Gastroenterology 01/18/2018  11:26 AM

## 2018-01-18 NOTE — Anesthesia Preprocedure Evaluation (Signed)
Anesthesia Evaluation  Patient identified by MRN, date of birth, ID band Patient awake    Reviewed: Allergy & Precautions, H&P , NPO status , Patient's Chart, lab work & pertinent test results, reviewed documented beta blocker date and time   History of Anesthesia Complications Negative for: history of anesthetic complications  Airway Mallampati: III  TM Distance: >3 FB Neck ROM: full    Dental no notable dental hx. (+) Dental Advidsory Given, Edentulous Upper, Edentulous Lower   Pulmonary shortness of breath and with exertion, neg sleep apnea, COPD,  COPD inhaler, Recent URI , Current Smoker,           Cardiovascular Exercise Tolerance: Good hypertension, (-) angina(-) CAD, (-) Past MI, (-) Cardiac Stents and (-) CABG (-) dysrhythmias (-) Valvular Problems/Murmurs     Neuro/Psych negative neurological ROS  negative psych ROS   GI/Hepatic negative GI ROS, Neg liver ROS,   Endo/Other  negative endocrine ROS  Renal/GU negative Renal ROS  negative genitourinary   Musculoskeletal   Abdominal   Peds  Hematology negative hematology ROS (+)   Anesthesia Other Findings Past Medical History: No date: Anemia No date: BPH (benign prostatic hyperplasia) No date: COPD (chronic obstructive pulmonary disease) (HCC) No date: Hypercholesterolemia No date: Hypertension   Reproductive/Obstetrics negative OB ROS                             Anesthesia Physical Anesthesia Plan  ASA: III  Anesthesia Plan: General   Post-op Pain Management:    Induction: Intravenous  PONV Risk Score and Plan: 1 and Propofol infusion and TIVA  Airway Management Planned: Nasal Cannula and Natural Airway  Additional Equipment:   Intra-op Plan:   Post-operative Plan:   Informed Consent: I have reviewed the patients History and Physical, chart, labs and discussed the procedure including the risks, benefits and  alternatives for the proposed anesthesia with the patient or authorized representative who has indicated his/her understanding and acceptance.   Dental Advisory Given  Plan Discussed with: Anesthesiologist, CRNA and Surgeon  Anesthesia Plan Comments:         Anesthesia Quick Evaluation

## 2018-01-18 NOTE — Transfer of Care (Signed)
Immediate Anesthesia Transfer of Care Note  Patient: Douglas Mathis  Procedure(s) Performed: ESOPHAGOGASTRODUODENOSCOPY (EGD) (N/A )  Patient Location: PACU  Anesthesia Type:General  Level of Consciousness: drowsy  Airway & Oxygen Therapy: Patient Spontanous Breathing and Patient connected to nasal cannula oxygen  Post-op Assessment: Report given to RN and Post -op Vital signs reviewed and stable  Post vital signs: Reviewed and stable  Last Vitals:  Vitals Value Taken Time  BP    Temp    Pulse 76 01/18/2018 11:45 AM  Resp 24 01/18/2018 11:45 AM  SpO2 100 % 01/18/2018 11:45 AM  Vitals shown include unvalidated device data.  Last Pain:  Vitals:   01/18/18 1027  TempSrc: Tympanic  PainSc: 0-No pain         Complications: No apparent anesthesia complications

## 2018-01-18 NOTE — Anesthesia Procedure Notes (Signed)
Performed by: Genavieve Mangiapane, CRNA Pre-anesthesia Checklist: Patient identified, Emergency Drugs available, Suction available, Patient being monitored and Timeout performed Patient Re-evaluated:Patient Re-evaluated prior to induction Oxygen Delivery Method: Nasal cannula Induction Type: IV induction       

## 2018-01-19 ENCOUNTER — Encounter: Payer: Self-pay | Admitting: Internal Medicine

## 2018-01-19 LAB — SURGICAL PATHOLOGY

## 2018-01-19 NOTE — Anesthesia Postprocedure Evaluation (Signed)
Anesthesia Post Note  Patient: Douglas Mathis  Procedure(s) Performed: ESOPHAGOGASTRODUODENOSCOPY (EGD) (N/A )  Patient location during evaluation: Endoscopy Anesthesia Type: General Level of consciousness: awake and alert Pain management: pain level controlled Vital Signs Assessment: post-procedure vital signs reviewed and stable Respiratory status: spontaneous breathing, nonlabored ventilation, respiratory function stable and patient connected to nasal cannula oxygen Cardiovascular status: blood pressure returned to baseline and stable Postop Assessment: no apparent nausea or vomiting Anesthetic complications: no     Last Vitals:  Vitals:   01/18/18 1150 01/18/18 1200  BP: (!) 117/48 (!) 115/59  Pulse: 75 80  Resp: (!) 24 19  Temp:    SpO2: 100% 100%    Last Pain:  Vitals:   01/18/18 1140  TempSrc: Tympanic  PainSc:                  [name]

## 2018-02-26 ENCOUNTER — Inpatient Hospital Stay
Admission: EM | Admit: 2018-02-26 | Discharge: 2018-03-01 | DRG: 193 | Disposition: A | Discharge status: Home / Self Care | Payer: Medicare Other | Attending: Internal Medicine | Admitting: Internal Medicine

## 2018-02-26 ENCOUNTER — Encounter: Payer: Self-pay | Admitting: *Deleted

## 2018-02-26 ENCOUNTER — Emergency Department: Payer: Medicare Other

## 2018-02-26 ENCOUNTER — Other Ambulatory Visit: Payer: Self-pay

## 2018-02-26 DIAGNOSIS — Z6824 Body mass index (BMI) 24.0-24.9, adult: Secondary | ICD-10-CM

## 2018-02-26 DIAGNOSIS — Z833 Family history of diabetes mellitus: Secondary | ICD-10-CM

## 2018-02-26 DIAGNOSIS — E440 Moderate protein-calorie malnutrition: Secondary | ICD-10-CM | POA: Diagnosis present

## 2018-02-26 DIAGNOSIS — J9622 Acute and chronic respiratory failure with hypercapnia: Secondary | ICD-10-CM | POA: Diagnosis present

## 2018-02-26 DIAGNOSIS — N400 Enlarged prostate without lower urinary tract symptoms: Secondary | ICD-10-CM | POA: Diagnosis present

## 2018-02-26 DIAGNOSIS — J449 Chronic obstructive pulmonary disease, unspecified: Secondary | ICD-10-CM | POA: Diagnosis not present

## 2018-02-26 DIAGNOSIS — J9621 Acute and chronic respiratory failure with hypoxia: Secondary | ICD-10-CM | POA: Diagnosis present

## 2018-02-26 DIAGNOSIS — J9600 Acute respiratory failure, unspecified whether with hypoxia or hypercapnia: Secondary | ICD-10-CM

## 2018-02-26 DIAGNOSIS — Z79899 Other long term (current) drug therapy: Secondary | ICD-10-CM

## 2018-02-26 DIAGNOSIS — Z7952 Long term (current) use of systemic steroids: Secondary | ICD-10-CM

## 2018-02-26 DIAGNOSIS — Z8249 Family history of ischemic heart disease and other diseases of the circulatory system: Secondary | ICD-10-CM

## 2018-02-26 DIAGNOSIS — E876 Hypokalemia: Secondary | ICD-10-CM | POA: Diagnosis present

## 2018-02-26 DIAGNOSIS — F17210 Nicotine dependence, cigarettes, uncomplicated: Secondary | ICD-10-CM | POA: Diagnosis present

## 2018-02-26 DIAGNOSIS — Z9981 Dependence on supplemental oxygen: Secondary | ICD-10-CM

## 2018-02-26 DIAGNOSIS — J101 Influenza due to other identified influenza virus with other respiratory manifestations: Principal | ICD-10-CM | POA: Diagnosis present

## 2018-02-26 DIAGNOSIS — D649 Anemia, unspecified: Secondary | ICD-10-CM | POA: Diagnosis not present

## 2018-02-26 DIAGNOSIS — I10 Essential (primary) hypertension: Secondary | ICD-10-CM | POA: Diagnosis present

## 2018-02-26 DIAGNOSIS — E7800 Pure hypercholesterolemia, unspecified: Secondary | ICD-10-CM | POA: Diagnosis present

## 2018-02-26 DIAGNOSIS — R739 Hyperglycemia, unspecified: Secondary | ICD-10-CM | POA: Diagnosis not present

## 2018-02-26 DIAGNOSIS — J9690 Respiratory failure, unspecified, unspecified whether with hypoxia or hypercapnia: Secondary | ICD-10-CM | POA: Diagnosis present

## 2018-02-26 DIAGNOSIS — J9601 Acute respiratory failure with hypoxia: Secondary | ICD-10-CM

## 2018-02-26 DIAGNOSIS — J441 Chronic obstructive pulmonary disease with (acute) exacerbation: Secondary | ICD-10-CM | POA: Diagnosis present

## 2018-02-26 DIAGNOSIS — Z23 Encounter for immunization: Secondary | ICD-10-CM | POA: Diagnosis not present

## 2018-02-26 LAB — COMPREHENSIVE METABOLIC PANEL
ALBUMIN: 4 g/dL (ref 3.5–5.0)
ALK PHOS: 74 U/L (ref 38–126)
ALT: 23 U/L (ref 0–44)
AST: 33 U/L (ref 15–41)
Anion gap: 8 (ref 5–15)
BUN: 13 mg/dL (ref 8–23)
CO2: 30 mmol/L (ref 22–32)
Calcium: 8.8 mg/dL — ABNORMAL LOW (ref 8.9–10.3)
Chloride: 97 mmol/L — ABNORMAL LOW (ref 98–111)
Creatinine, Ser: 0.78 mg/dL (ref 0.61–1.24)
GFR calc Af Amer: >60 mL/min (ref >60)
Glucose, Bld: 128 mg/dL — ABNORMAL HIGH (ref 70–99)
Potassium: 3.3 mmol/L — ABNORMAL LOW (ref 3.5–5.1)
Sodium: 135 mmol/L (ref 135–145)
TOTAL PROTEIN: 7.6 g/dL (ref 6.5–8.1)
Total Bilirubin: 0.5 mg/dL (ref 0.3–1.2)

## 2018-02-26 LAB — BLOOD GAS, VENOUS
Acid-Base Excess: 5.5 mmol/L — ABNORMAL HIGH (ref 0.0–2.0)
Bicarbonate: 34.8 mmol/L — ABNORMAL HIGH (ref 20.0–28.0)
O2 Saturation: 50.3 %
PATIENT TEMPERATURE: 37
PCO2 VEN: 74 mmHg — AB (ref 44.0–60.0)
PO2 VEN: 31 mmHg — AB (ref 32.0–45.0)
pH, Ven: 7.28 (ref 7.250–7.430)

## 2018-02-26 LAB — CBC WITH DIFFERENTIAL/PLATELET
ABS IMMATURE GRANULOCYTES: 0.1 10*3/uL — AB (ref 0.00–0.07)
BASOS PCT: 0 %
Basophils Absolute: 0 10*3/uL (ref 0.0–0.1)
Eosinophils Absolute: 0 10*3/uL (ref 0.0–0.5)
Eosinophils Relative: 0 %
HEMATOCRIT: 38.6 % — AB (ref 39.0–52.0)
Hemoglobin: 11.6 g/dL — ABNORMAL LOW (ref 13.0–17.0)
Immature Granulocytes: 1 %
LYMPHS PCT: 15 %
Lymphs Abs: 1.6 10*3/uL (ref 0.7–4.0)
MCH: 24.2 pg — ABNORMAL LOW (ref 26.0–34.0)
MCHC: 30.1 g/dL (ref 30.0–36.0)
MCV: 80.4 fL (ref 80.0–100.0)
Monocytes Absolute: 0.8 10*3/uL (ref 0.1–1.0)
Monocytes Relative: 8 %
NEUTROS ABS: 8.3 10*3/uL — AB (ref 1.7–7.7)
Neutrophils Relative %: 76 %
PLATELETS: 201 10*3/uL (ref 150–400)
RBC: 4.8 MIL/uL (ref 4.22–5.81)
RDW: 21.4 % — ABNORMAL HIGH (ref 11.5–15.5)
WBC: 10.8 10*3/uL — ABNORMAL HIGH (ref 4.0–10.5)
nRBC: 0 % (ref 0.0–0.2)

## 2018-02-26 LAB — INFLUENZA PANEL BY PCR (TYPE A & B)
INFLAPCR: NEGATIVE
Influenza B By PCR: POSITIVE — AB

## 2018-02-26 MED ORDER — MAGNESIUM SULFATE 2 GM/50ML IV SOLN
2.0000 g | Freq: Once | INTRAVENOUS | Status: AC
  Administered 2018-02-26: 2 g via INTRAVENOUS
  Filled 2018-02-26: qty 50

## 2018-02-26 MED ORDER — IPRATROPIUM-ALBUTEROL 0.5-2.5 (3) MG/3ML IN SOLN
3.0000 mL | Freq: Once | RESPIRATORY_TRACT | Status: AC
  Administered 2018-02-26: 3 mL via RESPIRATORY_TRACT
  Filled 2018-02-26: qty 3

## 2018-02-26 MED ORDER — DOXYCYCLINE HYCLATE 100 MG PO TABS
100.0000 mg | ORAL_TABLET | Freq: Once | ORAL | Status: AC
  Administered 2018-02-26: 100 mg via ORAL
  Filled 2018-02-26: qty 1

## 2018-02-26 MED ORDER — FUROSEMIDE 10 MG/ML IJ SOLN
20.0000 mg | Freq: Once | INTRAMUSCULAR | Status: AC
  Administered 2018-02-26: 20 mg via INTRAVENOUS
  Filled 2018-02-26: qty 4

## 2018-02-26 MED ORDER — LEVOFLOXACIN IN D5W 500 MG/100ML IV SOLN: 500.0000 mg | Freq: Once | INTRAVENOUS | Status: DC

## 2018-02-26 NOTE — ED Notes (Signed)
Dr. Roxan Hockey reduced O2 to 4.5L via

## 2018-02-26 NOTE — Progress Notes (Signed)
Family Meeting Note  Advance Directive:yes  Today a meeting took place with the Patient.  Patient is able to participate   The following clinical team members were present during this meeting:MD  The following were discussed:Patient's diagnosis: copd, Patient's progosis: Unable to determine and Goals for treatment: DNI  Additional follow-up to be provided: prn  Time spent during discussion:20 minutes  Bertrum Sol, MD

## 2018-02-26 NOTE — H&P (Signed)
[employer] - [place] at [facility]   PATIENT NAME: Douglas Mathis    MR#:  [id number]  DATE OF BIRTH:  [birth year]  DATE OF ADMISSION:  02/26/2018  PRIMARY CARE PHYSICIAN: [facility]   REQUESTING/REFERRING PHYSICIAN:   CHIEF COMPLAINT:   Chief Complaint  Patient presents with  . Shortness of Breath    HISTORY OF PRESENT ILLNESS: Douglas Mathis  is a 83 y.o. male with a known history per below presented to the emergency room with 3-day history of worsening nonproductive cough, generalized weakness, fatigue, dyspnea on exertion, brought to the hospital via EMS, was 87% on 5 L which is his chronic O2 requirement at home, in the emergency room patient was tachycardic, hypertensive, potassium 3.3, venous blood gas noted for respiratory acidosis on venous blood gas, patient seen in the emergency room, noted mild to moderate respiratory distress, plans are for BiPAP, patient now be admitted for acute on chronic hypoxic hypercapnic respiratory failure due to COPD exacerbation.   PAST MEDICAL HISTORY:   Past Medical History:  Diagnosis Date  . Anemia   . BPH (benign prostatic hyperplasia)   . COPD (chronic obstructive pulmonary disease) (HCC)   . Hypercholesterolemia   . Hypertension     PAST SURGICAL HISTORY:  Past Surgical History:  Procedure Laterality Date  . cataracts    . ESOPHAGOGASTRODUODENOSCOPY N/A 07/10/2015   Procedure: ESOPHAGOGASTRODUODENOSCOPY (EGD);  Surgeon: Scot Jun, MD;  Location: [facility] ENDOSCOPY;  Service: Endoscopy;  Laterality: N/A;  . ESOPHAGOGASTRODUODENOSCOPY N/A 01/18/2018   Procedure: ESOPHAGOGASTRODUODENOSCOPY (EGD);  Surgeon: Toledo, Boykin Nearing, MD;  Location: [facility] ENDOSCOPY;  Service: Gastroenterology;  Laterality: N/A;    SOCIAL HISTORY:  Social History   Tobacco Use  . Smoking status: Current Every Day Smoker    Packs/day: 1.00  . Smokeless tobacco: Never Used  Substance Use Topics  . Alcohol use: No     FAMILY HISTORY:  Family History  Problem Relation Age of Onset  . Diabetes Mother   . CAD Mother   . Bone cancer Brother   . Bronchitis Father     DRUG ALLERGIES: No Known Allergies  REVIEW OF SYSTEMS:   CONSTITUTIONAL: No fever, +fatigue weakness.  EYES: No blurred or double vision.  EARS, NOSE, AND THROAT: No tinnitus or ear pain.  RESPIRATORY: + cough, shortness of breath, wheezing  CARDIOVASCULAR: No chest pain, orthopnea, edema.  GASTROINTESTINAL: No nausea, vomiting, diarrhea or abdominal pain.  GENITOURINARY: No dysuria, hematuria.  ENDOCRINE: No polyuria, nocturia,  HEMATOLOGY: No anemia, easy bruising or bleeding SKIN: No rash or lesion. MUSCULOSKELETAL: No joint pain or arthritis.   NEUROLOGIC: No tingling, numbness, weakness.  PSYCHIATRY: No anxiety or depression.   MEDICATIONS AT HOME:  Prior to Admission medications   Medication Sig Start Date End Date Taking? Authorizing Provider  amlodipine-atorvastatin (CADUET) 10-20 MG tablet Take 1 tablet by mouth daily.    [provider]  DOK 100 MG capsule Take 100 mg by mouth 2 (two) times daily. 09/28/17   [provider]  ferrous sulfate 325 (65 FE) MG tablet Take 1 tablet (325 mg total) by mouth 2 (two) times daily with a meal. 07/22/15   Renae Gloss, Richard, MD  Fluticasone-Salmeterol (ADVAIR DISKUS IN) Inhale 1 puff into the lungs 2 (two) times daily.    [provider]  GNP VITAMIN C 500 MG tablet Take 500 mg by mouth 2 (two) times daily. 09/22/17   [provider]  ipratropium-albuterol (DUONEB) 0.5-2.5 (3) MG/3ML  SOLN Take 3 mLs by nebulization every 6 (six) hours as needed. 10/27/17   Salary, Evelena Asa, MD  lovastatin (MEVACOR) 40 MG tablet Take 40 mg by mouth at bedtime. 09/23/17   [provider]  montelukast (SINGULAIR) 10 MG tablet Take 10 mg by mouth daily. 10/22/17   [provider]  nicotine (NICODERM CQ - DOSED IN MG/24 HOURS) 21 mg/24hr patch Place 1 patch  (21 mg total) onto the skin daily. Patient not taking: Reported on 01/18/2018 10/28/17   Salary, Jetty Duhamel D, MD  predniSONE (DELTASONE) 50 MG tablet 1 p.o. daily 10/27/17   Salary, Evelena Asa, MD  PROAIR HFA 108 [phone] Base) MCG/ACT inhaler Inhale 2 puffs into the lungs 4 (four) times daily as needed for wheezing or shortness of breath.  08/04/17   [provider]  tamsulosin (FLOMAX) 0.4 MG CAPS capsule Take 0.4 mg by mouth daily after breakfast.     [provider]      PHYSICAL EXAMINATION:   VITAL SIGNS: Blood pressure (!) 145/51, pulse 87, temperature 97.8 F (36.6 C), temperature source Oral, resp. rate (!) 24, height 5\' 7"  (1.702 m), weight 79.4 kg, SpO2 91 %.  GENERAL:  83 y.o.-year-old patient lying in the bed with mild/moderate acute distress.  Frail-appearing EYES: Pupils equal, round, reactive to light and accommodation. No scleral icterus. Extraocular muscles intact.  HEENT: Head atraumatic, normocephalic. Oropharynx and nasopharynx clear.  NECK:  Supple, no jugular venous distention. No thyroid enlargement, no tenderness.  LUNGS: Severely diminished breath sounds throughout bilaterally + use of accessory muscles of respiration.  CARDIOVASCULAR: S1, S2 normal. No murmurs, rubs, or gallops.  ABDOMEN: Soft, nontender, nondistended. Bowel sounds present. No organomegaly or mass.  EXTREMITIES: No pedal edema, cyanosis, or clubbing.  NEUROLOGIC: Cranial nerves II through XII are intact. MAES. Gait not checked.  PSYCHIATRIC: The patient is alert and oriented x 3.  SKIN: No obvious rash, lesion, or ulcer.   LABORATORY PANEL:   CBC Recent Labs  Lab 02/26/18 2014  WBC 10.8*  HGB 11.6*  HCT 38.6*  PLT 201  MCV 80.4  MCH 24.2*  MCHC 30.1  RDW 21.4*  LYMPHSABS PENDING  MONOABS PENDING  EOSABS PENDING  BASOSABS PENDING   ------------------------------------------------------------------------------------------------------------------  Chemistries  Recent Labs   Lab 02/26/18 2014  NA 135  K 3.3*  CL 97*  CO2 30  GLUCOSE 128*  BUN 13  CREATININE 0.78  CALCIUM 8.8*  AST 33  ALT 23  ALKPHOS 74  BILITOT 0.5   ------------------------------------------------------------------------------------------------------------------ estimated creatinine clearance is 74.4 mL/min (by C-G formula based on SCr of 0.78 mg/dL). ------------------------------------------------------------------------------------------------------------------ No results for input(s): TSH, T4TOTAL, T3FREE, THYROIDAB in the last 72 hours.  Invalid input(s): FREET3   Coagulation profile No results for input(s): INR, PROTIME in the last 168 hours. ------------------------------------------------------------------------------------------------------------------- No results for input(s): DDIMER in the last 72 hours. -------------------------------------------------------------------------------------------------------------------  Cardiac Enzymes No results for input(s): CKMB, TROPONINI, MYOGLOBIN in the last 168 hours.  Invalid input(s): CK ------------------------------------------------------------------------------------------------------------------ Invalid input(s): POCBNP  ---------------------------------------------------------------------------------------------------------------  Urinalysis    Component Value Date/Time   COLORURINE YELLOW (A) 07/08/2015 1541   APPEARANCEUR CLEAR (A) 07/08/2015 1541   APPEARANCEUR Cloudy 10/05/2013 0414   LABSPEC 1.008 07/08/2015 1541   LABSPEC 1.017 10/05/2013 0414   PHURINE 6.0 07/08/2015 1541   GLUCOSEU NEGATIVE 07/08/2015 1541   GLUCOSEU Negative 10/05/2013 0414   HGBUR 1+ (A) 07/08/2015 1541   BILIRUBINUR NEGATIVE 07/08/2015 1541   BILIRUBINUR Negative 10/05/2013 0414   KETONESUR TRACE (A)  07/08/2015 1541   PROTEINUR NEGATIVE 07/08/2015 1541   NITRITE NEGATIVE 07/08/2015 1541   LEUKOCYTESUR TRACE (A) 07/08/2015  1541   LEUKOCYTESUR 3+ 10/05/2013 0414     RADIOLOGY: Dg Chest Portable 1 View  Result Date: 02/26/2018 CLINICAL DATA:  Respiratory distress. EXAM: PORTABLE CHEST 1 VIEW COMPARISON:  11/12/2017 FINDINGS: Cardiac silhouette is normal in size. No mediastinal or hilar masses. Lungs are hyperexpanded. There thickened interstitial markings bilaterally. Pleuroparenchymal scarring is noted at the left lung base. Possible small effusions. Interstitial thickening appears mildly increased when compared to the prior exam. No lung consolidation to suggest pneumonia. No pneumothorax. Skeletal structures are grossly intact. IMPRESSION: 1. Suspect interstitial edema superimposed on chronic interstitial thickening/COPD. Possible small pleural effusions. No convincing pneumonia. Electronically Signed   By: Amie Portlandavid  Ormond M.D.   On: 02/26/2018 20:26    EKG: Orders placed or performed during the hospital encounter of 02/26/18  . EKG 12-Lead  . EKG 12-Lead    IMPRESSION AND PLAN: *Acute on chronic hypoxic hypercapnic respiratory failure secondary to COPD *Acute COPD exacerbation *Chronic hypoxic respiratory failure-on 5 L via nasal cannula continuous at home *Acute hypokalemia *Chronic malnutrition *Chronic benign essential hypertension *Chronic BPH  Admit to stepdown unit on our COPD protocol, case discussed with intensivist, BiPAP/oxygen with weaning as tolerated, IV Solu-Medrol with tapering as tolerated, aggressive pulmonary toilet bronchodilator therapy, inhaled corticosteroids twice daily, mucolytic agents, empiric doxycycline, follow-up on cultures, replete potassium, dietary to see, add beta-blocker therapy, IV hydralazine as needed systolic blood pressure greater than 160, and continue close medical monitoring   All the records are reviewed and case discussed with ED provider. Management plans discussed with the patient, family and they are in agreement.  CODE STATUS:DNI Code Status History     Date Active Date Inactive Code Status Order ID Comments User Context   10/25/2017 1225 10/27/2017 1929 DNR [id number]  Alford HighlandWieting, Richard, MD ED   07/08/2015 2030 07/11/2015 1358 DNR [id number]  Altamese DillingVachhani, Vaibhavkumar, MD Inpatient    Questions for Most Recent Historical Code Status (Order [id number])    Question Answer Comment   In the event of cardiac or respiratory ARREST Do not call a "code blue"    In the event of cardiac or respiratory ARREST Do not perform Intubation, CPR, defibrillation or ACLS    In the event of cardiac or respiratory ARREST Use medication by any route, position, wound care, and other measures to relive pain and suffering. May use oxygen, suction and manual treatment of airway obstruction as needed for comfort.    Comments nurse may pronounce        TOTAL TIME TAKING CARE OF THIS PATIENT: 40 minutes.    Evelena AsaMontell D Salary M.D on 02/26/2018   Between 7am to 6pm - Pager - [phone]  After 6pm go to [email or web address] - Social research officer, governmentpassword [email or web address] [email or web address]  [facility]  Office  [phone]  CC: Primary care physician; [facility]   Note: This dictation was prepared with Nurse, children'sDragon dictation along with smaller phrase technology. Any transcriptional errors that result from this process are unintentional.  [facility] - [place] [facility] Primary care physician; [facility]   Note: This dictation was prepared with Dragon dictation along with smaller phrase technology. Any transcriptional errors that result from this process are unintentional.

## 2018-02-26 NOTE — ED Triage Notes (Signed)
Per EMS report, patient c/o shortness of breath. Patient is on 5L O2 via [place] at home. Patient is able to speak in complete sentences, but has to take deep breaths afterwards. Patient was given Albuterol, Duoneb and 125mg  Solumedrol en route by EMTs.

## 2018-02-26 NOTE — ED Notes (Signed)
Report given to Laurie Allen RN 

## 2018-02-26 NOTE — ED Notes (Signed)
Pt resting in bed, HOB elevated for comfort; BiPap in place; sats 99%; pt understands there is not a bed for him in CCU and that's where he will need to be placed due to BiPap; told pt I would work on getting him a hospital bed for comfort; pt appreciative; call bell in reach

## 2018-02-26 NOTE — ED Provider Notes (Signed)
[facility] Emergency Department Provider Note    First MD Initiated Contact with Patient 02/26/18 1957     (approximate)  I have reviewed the triage vital signs and the nursing notes.   HISTORY  Chief Complaint Shortness of Breath    HPI Douglas Mathis is a 83 y.o. male to history of COPD on chronic home oxygen he states up to 5L at baseline but has been having worsening shortness of breath over the past several days associated nonproductive cough.  No fevers or chills.  Has not been on any recent antibiotics.  Denies any chest pain or lower extremity swelling.  It feels consistent with COPD exacerbation.  EMS found the patient to be hypoxic to 87% on 5 L nasal cannula.  He was given Solu-Medrol as well as 2 nebulizers in route with some improvement in his symptoms.    Past Medical History:  Diagnosis Date  . Anemia   . BPH (benign prostatic hyperplasia)   . COPD (chronic obstructive pulmonary disease) (HCC)   . Hypercholesterolemia   . Hypertension    Family History  Problem Relation Age of Onset  . Diabetes Mother   . CAD Mother   . Bone cancer Brother   . Bronchitis Father    Past Surgical History:  Procedure Laterality Date  . cataracts    . ESOPHAGOGASTRODUODENOSCOPY N/A 07/10/2015   Procedure: ESOPHAGOGASTRODUODENOSCOPY (EGD);  Surgeon: Scot Jun, MD;  Location: [facility] ENDOSCOPY;  Service: Endoscopy;  Laterality: N/A;  . ESOPHAGOGASTRODUODENOSCOPY N/A 01/18/2018   Procedure: ESOPHAGOGASTRODUODENOSCOPY (EGD);  Surgeon: Toledo, Boykin Nearing, MD;  Location: [facility] ENDOSCOPY;  Service: Gastroenterology;  Laterality: N/A;   Patient Active Problem List   Diagnosis Date Noted  . COPD exacerbation (HCC) 10/25/2017  . GI bleed 07/08/2015      Prior to Admission medications   Medication Sig Start Date End Date Taking? Authorizing Provider  amlodipine-atorvastatin (CADUET) 10-20 MG tablet Take 1 tablet by mouth daily.    [provider]  azithromycin (ZITHROMAX) 250 MG tablet 1 p.o. daily Patient not taking: Reported on 01/18/2018 10/28/17   Salary, Jetty Duhamel D, MD  DOK 100 MG capsule Take 100 mg by mouth 2 (two) times daily. 09/28/17   [provider]  ferrous sulfate 325 (65 FE) MG tablet Take 1 tablet (325 mg total) by mouth 2 (two) times daily with a meal. 07/22/15   Renae Gloss, Richard, MD  Fluticasone-Salmeterol (ADVAIR DISKUS IN) Inhale 1 puff into the lungs 2 (two) times daily.    [provider]  GNP VITAMIN C 500 MG tablet Take 500 mg by mouth 2 (two) times daily. 09/22/17   [provider]  ipratropium-albuterol (DUONEB) 0.5-2.5 (3) MG/3ML SOLN Take 3 mLs by nebulization every 6 (six) hours as needed. 10/27/17   Salary, Evelena Asa, MD  lovastatin (MEVACOR) 40 MG tablet Take 40 mg by mouth at bedtime. 09/23/17   [provider]  montelukast (SINGULAIR) 10 MG tablet Take 10 mg by mouth daily. 10/22/17   [provider]  nicotine (NICODERM CQ - DOSED IN MG/24 HOURS) 21 mg/24hr patch Place 1 patch (21 mg total) onto the skin daily. Patient not taking: Reported on 01/18/2018 10/28/17   Salary, Jetty Duhamel D, MD  predniSONE (DELTASONE) 50 MG tablet 1 p.o. daily 10/27/17   Salary, Evelena Asa, MD  PROAIR HFA 108 [phone] Base) MCG/ACT inhaler Inhale 2 puffs into the lungs 4 (four) times daily as needed for wheezing or shortness of breath.  08/04/17   [provider]  tamsulosin (FLOMAX) 0.4 MG CAPS capsule Take 0.4 mg by mouth daily after breakfast.     [provider]    Allergies Patient has no known allergies.    Social History Social History   Tobacco Use  . Smoking status: Current Every Day Smoker    Packs/day: 1.00  . Smokeless tobacco: Never Used  Substance Use Topics  . Alcohol use: No  . Drug use: Never    Review of Systems Patient denies headaches, rhinorrhea, blurry vision, numbness, shortness of breath, chest pain, edema, cough, abdominal pain, nausea,  vomiting, diarrhea, dysuria, fevers, rashes or hallucinations unless otherwise stated above in HPI. ____________________________________________   PHYSICAL EXAM:  VITAL SIGNS: Vitals:   02/26/18 2004  BP: (!) 174/82  Pulse: 87  Resp: (!) 28  Temp: 97.8 F (36.6 C)  SpO2: 91%    Constitutional: Alert and oriented.  In mild respiratory distress Eyes: Conjunctivae are normal.  Head: Atraumatic. Nose: No congestion/rhinnorhea. Mouth/Throat: Mucous membranes are moist.   Neck: No stridor. Painless ROM.  Cardiovascular:tachycardic, regular rhythm. Grossly normal heart sounds.  Good peripheral circulation. Respiratory:tachypnea with use of accessory muscles.  Significantly prolonged expiratory phase with pursed lip breathing.  Diminished breath sounds throughout. Gastrointestinal: Soft and nontender. No distention. No abdominal bruits. No CVA tenderness. Genitourinary:  Musculoskeletal: No lower extremity tenderness nor edema.  No joint effusions. Neurologic:  Normal speech and language. No gross focal neurologic deficits are appreciated. No facial droop Skin:  Skin is warm, dry and intact. No rash noted. Psychiatric: Mood and affect are normal. Speech and behavior are normal.  ____________________________________________   LABS (all labs ordered are listed, but only abnormal results are displayed)  No results found for this or any previous visit (from the past 24 hour(s)). ____________________________________________  EKG My review and personal interpretation at Time: 20:05   Indication: sob  Rate: 90  Rhythm: sinus with frequent pvc Axis: normal Other: nonspecific st abn, no stemi ____________________________________________  RADIOLOGY  I personally reviewed all radiographic images ordered to evaluate for the above acute complaints and reviewed radiology reports and findings.  These findings were personally discussed with the patient.  Please see medical record for  radiology report.  ____________________________________________   PROCEDURES  Procedure(s) performed:  Procedures    ____________________________________________   INITIAL IMPRESSION / ASSESSMENT AND PLAN / ED COURSE  Pertinent labs & imaging results that were available during my care of the patient were reviewed by me and considered in my medical decision making (see chart for details).   DDX: Asthma, copd, CHF, pna, ptx, malignancy, Pe, anemia   Douglas Mathis is a 83 y.o. who presents to the ED with what appears to be significant COPD exacerbation with acute on chronic respiratory failure with hypoxia.  Patient protecting his airway at this time.  Given nebulizers both in route and in the ER with improvement.  Will add on VBG.  Will check for flu.  Patient has received steroids.  Based on his respiratory failure do feel patient will require hospitalization.      As part of my medical decision making, I reviewed the following data within the electronic MEDICAL RECORD NUMBER Nursing notes reviewed and incorporated, Labs reviewed, notes from prior ED visits.  ____________________________________________   FINAL CLINICAL IMPRESSION(S) / ED DIAGNOSES  Final diagnoses:  COPD exacerbation (HCC)  Acute respiratory failure with hypoxia (HCC)      NEW MEDICATIONS STARTED DURING THIS VISIT:  New  Prescriptions   No medications on file     Note:  This document was prepared using Dragon voice recognition software and may include unintentional dictation errors.    Willy Eddyobinson, Sundae Maners, MD 02/26/18 2034

## 2018-02-27 ENCOUNTER — Other Ambulatory Visit: Payer: Self-pay

## 2018-02-27 DIAGNOSIS — J9622 Acute and chronic respiratory failure with hypercapnia: Secondary | ICD-10-CM

## 2018-02-27 DIAGNOSIS — J9621 Acute and chronic respiratory failure with hypoxia: Secondary | ICD-10-CM

## 2018-02-27 LAB — CBC
HEMATOCRIT: 34.4 % — AB (ref 39.0–52.0)
Hemoglobin: 10.4 g/dL — ABNORMAL LOW (ref 13.0–17.0)
MCH: 24.3 pg — ABNORMAL LOW (ref 26.0–34.0)
MCHC: 30.2 g/dL (ref 30.0–36.0)
MCV: 80.4 fL (ref 80.0–100.0)
Platelets: 177 10*3/uL (ref 150–400)
RBC: 4.28 MIL/uL (ref 4.22–5.81)
RDW: 21.5 % — ABNORMAL HIGH (ref 11.5–15.5)
WBC: 4 10*3/uL (ref 4.0–10.5)
nRBC: 0 % (ref 0.0–0.2)

## 2018-02-27 LAB — CREATININE, SERUM
Creatinine, Ser: 0.77 mg/dL (ref 0.61–1.24)
GFR calc Af Amer: >60 mL/min (ref >60)
GFR calc non Af Amer: >60 mL/min (ref >60)

## 2018-02-27 LAB — POTASSIUM: Potassium: 4.2 mmol/L (ref 3.5–5.1)

## 2018-02-27 LAB — GLUCOSE, CAPILLARY: Glucose-Capillary: 163 mg/dL — ABNORMAL HIGH (ref 70–99)

## 2018-02-27 LAB — TSH: TSH: 0.856 u[IU]/mL (ref 0.350–4.500)

## 2018-02-27 LAB — MAGNESIUM: Magnesium: 2.2 mg/dL (ref 1.7–2.4)

## 2018-02-27 LAB — MRSA PCR SCREENING: MRSA by PCR: NEGATIVE

## 2018-02-27 MED ORDER — ACETAMINOPHEN 325 MG PO TABS: 650.0000 mg | ORAL_TABLET | Freq: Four times a day (QID) | ORAL | Status: DC | PRN

## 2018-02-27 MED ORDER — ACETAMINOPHEN 650 MG RE SUPP: 650.0000 mg | Freq: Four times a day (QID) | RECTAL | Status: DC | PRN

## 2018-02-27 MED ORDER — SODIUM CHLORIDE 0.9 % IV SOLN
100.0000 mg | Freq: Two times a day (BID) | INTRAVENOUS | Status: DC
  Administered 2018-02-27: 100 mg via INTRAVENOUS
  Filled 2018-02-27 (×3): qty 100

## 2018-02-27 MED ORDER — POLYETHYLENE GLYCOL 3350 17 G PO PACK: 17.0000 g | PACK | Freq: Every day | ORAL | Status: DC | PRN

## 2018-02-27 MED ORDER — INFLUENZA VAC SPLIT HIGH-DOSE 0.5 ML IM SUSY
0.5000 mL | PREFILLED_SYRINGE | INTRAMUSCULAR | Status: AC
  Administered 2018-02-28: 0.5 mL via INTRAMUSCULAR
  Filled 2018-02-27 (×2): qty 0.5

## 2018-02-27 MED ORDER — TAMSULOSIN HCL 0.4 MG PO CAPS
0.4000 mg | ORAL_CAPSULE | Freq: Every day | ORAL | Status: DC
  Administered 2018-02-27 – 2018-03-01 (×3): 0.4 mg via ORAL
  Filled 2018-02-27 (×3): qty 1

## 2018-02-27 MED ORDER — ORAL CARE MOUTH RINSE
15.0000 mL | Freq: Two times a day (BID) | OROMUCOSAL | Status: DC
  Administered 2018-02-27 – 2018-02-28 (×3): 15 mL via OROMUCOSAL

## 2018-02-27 MED ORDER — METHYLPREDNISOLONE SODIUM SUCC 125 MG IJ SOLR
60.0000 mg | Freq: Four times a day (QID) | INTRAMUSCULAR | Status: DC
  Administered 2018-02-27 – 2018-02-28 (×7): 60 mg via INTRAVENOUS
  Filled 2018-02-27 (×7): qty 2

## 2018-02-27 MED ORDER — BUDESONIDE 0.25 MG/2ML IN SUSP
0.5000 mg | Freq: Two times a day (BID) | RESPIRATORY_TRACT | Status: DC
  Administered 2018-02-27 – 2018-02-28 (×3): 0.5 mg via RESPIRATORY_TRACT
  Filled 2018-02-27 (×4): qty 4

## 2018-02-27 MED ORDER — POTASSIUM CHLORIDE 20 MEQ PO PACK
40.0000 meq | PACK | Freq: Once | ORAL | Status: AC
  Administered 2018-02-27: 40 meq via ORAL
  Filled 2018-02-27: qty 2

## 2018-02-27 MED ORDER — LEVOFLOXACIN IN D5W 500 MG/100ML IV SOLN
500.0000 mg | Freq: Every day | INTRAVENOUS | Status: DC
  Administered 2018-02-27 – 2018-02-28 (×2): 500 mg via INTRAVENOUS
  Filled 2018-02-27 (×4): qty 100

## 2018-02-27 MED ORDER — SODIUM CHLORIDE 0.9 % IV SOLN
250.0000 mL | INTRAVENOUS | Status: DC | PRN
  Administered 2018-02-28: 250 mL via INTRAVENOUS

## 2018-02-27 MED ORDER — PRAVASTATIN SODIUM 40 MG PO TABS: 40.0000 mg | ORAL_TABLET | Freq: Every day | ORAL | Status: DC

## 2018-02-27 MED ORDER — ONDANSETRON HCL 4 MG/2ML IJ SOLN: 4.0000 mg | Freq: Four times a day (QID) | INTRAMUSCULAR | Status: DC | PRN

## 2018-02-27 MED ORDER — SODIUM CHLORIDE 0.9% FLUSH
3.0000 mL | Freq: Two times a day (BID) | INTRAVENOUS | Status: DC
  Administered 2018-02-27 – 2018-03-01 (×5): 3 mL via INTRAVENOUS

## 2018-02-27 MED ORDER — ENOXAPARIN SODIUM 40 MG/0.4ML ~~LOC~~ SOLN
40.0000 mg | SUBCUTANEOUS | Status: DC
  Administered 2018-02-27: 40 mg via SUBCUTANEOUS
  Filled 2018-02-27: qty 0.4

## 2018-02-27 MED ORDER — MONTELUKAST SODIUM 10 MG PO TABS
10.0000 mg | ORAL_TABLET | Freq: Every day | ORAL | Status: DC
  Administered 2018-02-27 – 2018-03-01 (×3): 10 mg via ORAL
  Filled 2018-02-27 (×4): qty 1

## 2018-02-27 MED ORDER — HYDROCODONE-ACETAMINOPHEN 5-325 MG PO TABS: 1.0000 | ORAL_TABLET | ORAL | Status: DC | PRN

## 2018-02-27 MED ORDER — ATORVASTATIN CALCIUM 20 MG PO TABS
20.0000 mg | ORAL_TABLET | Freq: Every day | ORAL | Status: DC
  Administered 2018-02-27 – 2018-02-28 (×2): 20 mg via ORAL
  Filled 2018-02-27 (×2): qty 1

## 2018-02-27 MED ORDER — OSELTAMIVIR PHOSPHATE 75 MG PO CAPS
75.0000 mg | ORAL_CAPSULE | Freq: Two times a day (BID) | ORAL | Status: DC
  Administered 2018-02-27 – 2018-03-01 (×5): 75 mg via ORAL
  Filled 2018-02-27 (×5): qty 1

## 2018-02-27 MED ORDER — AMLODIPINE-ATORVASTATIN 10-20 MG PO TABS: 1.0000 | ORAL_TABLET | Freq: Every day | ORAL | Status: DC

## 2018-02-27 MED ORDER — HYDRALAZINE HCL 20 MG/ML IJ SOLN
10.0000 mg | INTRAMUSCULAR | Status: DC | PRN
  Administered 2018-02-27 – 2018-03-01 (×2): 10 mg via INTRAVENOUS
  Filled 2018-02-27 (×2): qty 1

## 2018-02-27 MED ORDER — METOPROLOL TARTRATE 25 MG PO TABS
37.5000 mg | ORAL_TABLET | Freq: Two times a day (BID) | ORAL | Status: DC
  Administered 2018-02-27 – 2018-03-01 (×5): 37.5 mg via ORAL
  Filled 2018-02-27 (×6): qty 2

## 2018-02-27 MED ORDER — AMLODIPINE BESYLATE 10 MG PO TABS
10.0000 mg | ORAL_TABLET | Freq: Every day | ORAL | Status: DC
  Administered 2018-02-27 – 2018-03-01 (×3): 10 mg via ORAL
  Filled 2018-02-27 (×3): qty 1

## 2018-02-27 MED ORDER — PANTOPRAZOLE SODIUM 40 MG IV SOLR
40.0000 mg | INTRAVENOUS | Status: DC
  Administered 2018-02-27: 40 mg via INTRAVENOUS
  Filled 2018-02-27: qty 40

## 2018-02-27 MED ORDER — GUAIFENESIN ER 600 MG PO TB12
600.0000 mg | ORAL_TABLET | Freq: Two times a day (BID) | ORAL | Status: DC
  Administered 2018-02-27 – 2018-02-28 (×3): 600 mg via ORAL
  Filled 2018-02-27 (×4): qty 1

## 2018-02-27 MED ORDER — CHLORHEXIDINE GLUCONATE 0.12 % MT SOLN
15.0000 mL | Freq: Two times a day (BID) | OROMUCOSAL | Status: DC
  Administered 2018-02-27 – 2018-03-01 (×3): 15 mL via OROMUCOSAL
  Filled 2018-02-27 (×4): qty 15

## 2018-02-27 MED ORDER — NICOTINE 21 MG/24HR TD PT24
21.0000 mg | MEDICATED_PATCH | Freq: Every day | TRANSDERMAL | Status: DC
  Administered 2018-02-27 – 2018-03-01 (×3): 21 mg via TRANSDERMAL
  Filled 2018-02-27 (×3): qty 1

## 2018-02-27 MED ORDER — FERROUS SULFATE 325 (65 FE) MG PO TABS
325.0000 mg | ORAL_TABLET | Freq: Two times a day (BID) | ORAL | Status: DC
  Administered 2018-02-27 – 2018-03-01 (×5): 325 mg via ORAL
  Filled 2018-02-27 (×6): qty 1

## 2018-02-27 MED ORDER — ONDANSETRON HCL 4 MG PO TABS: 4.0000 mg | ORAL_TABLET | Freq: Four times a day (QID) | ORAL | Status: DC | PRN

## 2018-02-27 MED ORDER — ENOXAPARIN SODIUM 40 MG/0.4ML ~~LOC~~ SOLN
40.0000 mg | SUBCUTANEOUS | Status: DC
  Administered 2018-02-28 – 2018-03-01 (×2): 40 mg via SUBCUTANEOUS
  Filled 2018-02-27 (×2): qty 0.4

## 2018-02-27 MED ORDER — PANTOPRAZOLE SODIUM 40 MG PO TBEC
40.0000 mg | DELAYED_RELEASE_TABLET | Freq: Every day | ORAL | Status: DC
  Administered 2018-02-28 – 2018-03-01 (×2): 40 mg via ORAL
  Filled 2018-02-27 (×2): qty 1

## 2018-02-27 MED ORDER — IPRATROPIUM-ALBUTEROL 0.5-2.5 (3) MG/3ML IN SOLN
3.0000 mL | Freq: Four times a day (QID) | RESPIRATORY_TRACT | Status: DC
  Administered 2018-02-27 – 2018-03-01 (×10): 3 mL via RESPIRATORY_TRACT
  Filled 2018-02-27 (×11): qty 3

## 2018-02-27 MED ORDER — SODIUM CHLORIDE 0.9% FLUSH: 3.0000 mL | INTRAVENOUS | Status: DC | PRN

## 2018-02-27 NOTE — Progress Notes (Signed)
[employer] - [place] at [facility]   PATIENT NAME: Douglas Mathis    MR#:  [id number]  DATE OF BIRTH:  06-Nov-1935  SUBJECTIVE:  CHIEF COMPLAINT:   Chief Complaint  Patient presents with  . Shortness of Breath   The patient is on BiPAP, still has shortness of breath, cough and wheezing. REVIEW OF SYSTEMS:  Review of Systems  Constitutional: Positive for malaise/fatigue. Negative for chills and fever.  HENT: Negative for sore throat.   Eyes: Negative for blurred vision and double vision.  Respiratory: Positive for cough, sputum production, shortness of breath and wheezing. Negative for hemoptysis and stridor.   Cardiovascular: Negative for chest pain, palpitations, orthopnea and leg swelling.  Gastrointestinal: Negative for abdominal pain, blood in stool, diarrhea, melena, nausea and vomiting.  Genitourinary: Negative for dysuria, flank pain and hematuria.  Musculoskeletal: Negative for back pain and joint pain.  Skin: Negative for rash.  Neurological: Negative for dizziness, sensory change, focal weakness, seizures, loss of consciousness, weakness and headaches.  Endo/Heme/Allergies: Negative for polydipsia.  Psychiatric/Behavioral: Negative for depression. The patient is not nervous/anxious.     DRUG ALLERGIES:  No Known Allergies VITALS:  Blood pressure (!) 150/66, pulse (!) 57, temperature 97.6 F (36.4 C), temperature source Axillary, resp. rate (!) 21, height 5\' 8"  (1.727 m), weight 73.5 kg, SpO2 99 %. PHYSICAL EXAMINATION:  Physical Exam Constitutional:      Appearance: He is ill-appearing.     Comments: Moderate malnutrition.  HENT:     Head: Normocephalic.  Eyes:     General: No scleral icterus.    Conjunctiva/sclera: Conjunctivae normal.     Pupils: Pupils are equal, round, and reactive to light.  Neck:     Musculoskeletal: Normal range of motion and neck supple.     Vascular: No JVD.     Trachea: No tracheal deviation.  Cardiovascular:      Rate and Rhythm: Normal rate and regular rhythm.     Heart sounds: Normal heart sounds. No murmur. No gallop.   Pulmonary:     Effort: Pulmonary effort is normal. No respiratory distress.     Breath sounds: No stridor. No wheezing, rhonchi or rales.     Comments: Diminished breath sounds. Abdominal:     General: Bowel sounds are normal. There is no distension.     Palpations: Abdomen is soft.     Tenderness: There is no abdominal tenderness. There is no rebound.  Musculoskeletal: Normal range of motion.        General: No tenderness.     Right lower leg: No edema.     Left lower leg: No edema.  Skin:    Findings: No erythema or rash.  Neurological:     Mental Status: He is alert and oriented to person, place, and time.     Cranial Nerves: No cranial nerve deficit.  Psychiatric:        Mood and Affect: Mood normal.    LABORATORY PANEL:  Male CBC Recent Labs  Lab 02/27/18 0721  WBC 4.0  HGB 10.4*  HCT 34.4*  PLT 177   ------------------------------------------------------------------------------------------------------------------ Chemistries  Recent Labs  Lab 02/26/18 2014 02/27/18 0721  NA 135  --   K 3.3* 4.2  CL 97*  --   CO2 30  --   GLUCOSE 128*  --   BUN 13  --   CREATININE 0.78 0.77  CALCIUM 8.8*  --   MG  --  2.2  AST 33  --  ALT 23  --   ALKPHOS 74  --   BILITOT 0.5  --    RADIOLOGY:  Dg Chest Portable 1 View  Result Date: 02/26/2018 CLINICAL DATA:  Respiratory distress. EXAM: PORTABLE CHEST 1 VIEW COMPARISON:  11/12/2017 FINDINGS: Cardiac silhouette is normal in size. No mediastinal or hilar masses. Lungs are hyperexpanded. There thickened interstitial markings bilaterally. Pleuroparenchymal scarring is noted at the left lung base. Possible small effusions. Interstitial thickening appears mildly increased when compared to the prior exam. No lung consolidation to suggest pneumonia. No pneumothorax. Skeletal structures are grossly intact.  IMPRESSION: 1. Suspect interstitial edema superimposed on chronic interstitial thickening/COPD. Possible small pleural effusions. No convincing pneumonia. Electronically Signed   By: Amie Portland M.D.   On: 02/26/2018 20:26   ASSESSMENT AND PLAN:   *Acute on chronic hypoxic hypercapnic respiratory failure secondary to COPD and influenza B.  The patient has chronic respiratory failure, on home oxygen 5 L. The patient is on BiPAP, try to wean off BiPAP. Continue nebulizer treatment, IV Solu-Medrol, Singulair and Pulmicort, Robitussin as needed.  Continue doxycycline.  Started Tamiflu.  *Acute hypokalemia.  Improved with supplement.  Magnesium is normal. *Chronic moderate malnutrition.  Follow-up dietitian. *Chronic benign essential hypertension, continue home hypertension medication. *Chronic BPH, continue Flomax.  Tobacco abuse.  Smoking cessation was counseled for 4 minutes, nicotine patch.  All the records are reviewed and case discussed with Care Management/Social Worker. Management plans discussed with the patient, family and they are in agreement.  CODE STATUS: Partial Code  TOTAL TIME TAKING CARE OF THIS PATIENT: 37 minutes.   More than 50% of the time was spent in counseling/coordination of care: YES  POSSIBLE D/C IN 3 DAYS, DEPENDING ON CLINICAL CONDITION.   Shaune Pollack M.D on 02/27/2018 at 2:38 PM  Between 7am to 6pm - Pager - [phone]  After 6pm go to [email or web address] - Therapist, nutritional Hospitalists

## 2018-02-27 NOTE — Progress Notes (Signed)
Pt was hungry and wanted food. Messaged Dr. Lonn Georgia. Per his order, Bipap weaned off well. Pt was on 5L [place] now with SaO2 above 95%. Heart healthy lunch ordered.

## 2018-02-27 NOTE — ED Notes (Signed)
Pt resting with eyes closed; legs moving about bed; awakened when name called; no complaints or requests;

## 2018-02-27 NOTE — ED Notes (Signed)
Eyes closed, resp even ;bi-pap in place sats 100%; waiting for floor bed to move pt for comfort;

## 2018-02-27 NOTE — Progress Notes (Signed)
PHARMACIST - PHYSICIAN COMMUNICATION   CONCERNING: IV to Oral Route Change Policy  RECOMMENDATION: This patient is receiving pnatoprazole by the intravenous route.  Based on criteria approved by the Pharmacy and Therapeutics Committee, the intravenous medication(s) is/are being converted to the equivalent oral dose form(s).   DESCRIPTION: These criteria include:  The patient is eating (either orally or via tube) and/or has been taking other orally administered medications for a least 24 hours  The patient has no evidence of active gastrointestinal bleeding or impaired GI absorption (gastrectomy, short bowel, patient on TNA or NPO).  If you have questions about this conversion, please contact the Pharmacy Department   []   [phone] )  [facility]  [place], [facility] 02/27/2018 8:34 AM

## 2018-02-27 NOTE — ED Notes (Signed)
Pt used call bell; in to check on him; voiced need to void; pt assisted to stand at bedside per his request; pt steady on his feet;

## 2018-02-27 NOTE — Consult Note (Signed)
Name: Douglas Mathis MRN: [id number] DOB: [birth year]    ADMISSION DATE:  02/26/2018 CONSULTATION DATE: 02/26/2017  REFERRING MD : Dr. Katheren Shams  CHIEF COMPLAINT: Shortness of Breath   BRIEF PATIENT DESCRIPTION:  83 yo male admitted with acute on chronic hypercapnic hypoxic respiratory failure secondary to AECOPD and influenza B requiring Bipap   SIGNIFICANT EVENTS  01/5-Pt admitted to the stepdown unit   HISTORY OF PRESENT ILLNESS:   This is an 83 yo male with a PMH of HTN, Hypercholesterolemia, COPD, Chronic Home O2 @5L , BPH, and Anemia.  He presented to [facility] ER via on 01/4 with worsening shortness of breath and nonproductive cough onset of symptoms several days prior to presentation.  Per ER notes EMS reported pt hypoxic with O2 sats at 87% on 5L via nasal canula.  He received iv solumedrol and nebulizer treatment x2 en route to the ER with slight improvement of symptoms.  In the ER lab results revealed K+ 3.3, chloride 97, wbc 10.8, hgb 11.6, and vbg pH 7.28/pCO2 74/bicarb 34.8.  CXR negative for pneumonia, however infuenza pcr positive for influenza B.  Due to continued respiratory failure pt placed on Bipap.  He was subsequently admitted to the stepdown unit for additional workup and treatment.   PAST MEDICAL HISTORY :   has a past medical history of Anemia, BPH (benign prostatic hyperplasia), COPD (chronic obstructive pulmonary disease) (HCC), Hypercholesterolemia, and Hypertension.  has a past surgical history that includes Esophagogastroduodenoscopy (N/A, 07/10/2015); cataracts; and Esophagogastroduodenoscopy (N/A, 01/18/2018). Prior to Admission medications   Medication Sig Start Date End Date Taking? Authorizing Provider  amlodipine-atorvastatin (CADUET) 10-20 MG tablet Take 1 tablet by mouth daily.   Yes [provider]  DOK 100 MG capsule Take 100 mg by mouth 2 (two) times daily. 09/28/17  Yes [provider]  ferrous sulfate 325 (65 FE) MG tablet Take 1 tablet  (325 mg total) by mouth 2 (two) times daily with a meal. 07/22/15  Yes Wieting, Richard, MD  Fluticasone-Salmeterol (ADVAIR DISKUS IN) Inhale 1 puff into the lungs 2 (two) times daily.   Yes [provider]  GNP VITAMIN C 500 MG tablet Take 500 mg by mouth 2 (two) times daily. 09/22/17  Yes [provider]  lovastatin (MEVACOR) 40 MG tablet Take 40 mg by mouth at bedtime. 09/23/17  Yes [provider]  montelukast (SINGULAIR) 10 MG tablet Take 10 mg by mouth daily. 10/22/17  Yes [provider]  PROAIR HFA 108 (90 Base) MCG/ACT inhaler Inhale 2 puffs into the lungs 4 (four) times daily as needed for wheezing or shortness of breath.  08/04/17  Yes [provider]  tamsulosin (FLOMAX) 0.4 MG CAPS capsule Take 0.4 mg by mouth daily after breakfast.    Yes [provider]  ipratropium-albuterol (DUONEB) 0.5-2.5 (3) MG/3ML SOLN Take 3 mLs by nebulization every 6 (six) hours as needed. Patient not taking: Reported on 02/26/2018 10/27/17   Salary, Jetty Duhamel D, MD  nicotine (NICODERM CQ - DOSED IN MG/24 HOURS) 21 mg/24hr patch Place 1 patch (21 mg total) onto the skin daily. Patient not taking: Reported on 01/18/2018 10/28/17   Salary, Evelena Asa, MD  predniSONE (DELTASONE) 50 MG tablet 1 p.o. daily Patient not taking: Reported on 02/26/2018 10/27/17   Salary, Evelena Asa, MD   No Known Allergies  FAMILY HISTORY:  family history includes Bone cancer in his brother; Bronchitis in his father; CAD in his mother; Diabetes in his mother. SOCIAL HISTORY:  reports that he  has been smoking. He has been smoking about 1.00 pack per day. He has never used smokeless tobacco. He reports that he does not drink alcohol or use drugs.  REVIEW OF SYSTEMS: SUBJECTIVE:  Pertinent review of systems noted in HPI.  All else negative  VITAL SIGNS: Temp:  [97.8 F (36.6 C)] 97.8 F (36.6 C) (01/04 2004) Pulse Rate:  [50-87] 51 (01/05 0400) Resp:  [15-28] 18 (01/05 0400) BP:  (116-174)/(45-82) 128/50 (01/05 0400) SpO2:  [91 %-100 %] 97 % (01/05 0400) Weight:  [79.4 kg] 79.4 kg (01/04 2006)  PHYSICAL EXAMINATION: General: Patient is awake, alert, on BiPAP, in moderate respiratory distress.  States he is feeling better though Neuro: Moves all extremities, cranial nerves grossly intact, no focal deficits appreciated HEENT: Trachea midline, no thyromegaly noted, limited oral exam secondary to BiPAP, no jugular venous distention noted Cardiovascular: Regular rhythm, bradycardia appreciated Lungs: Heartedly diminished breath sounds bilaterally, prolonged expiratory phase, diffuse expiratory wheezing Abdomen: Bowel sounds, soft exam Musculoskeletal: No clubbing, cyanosis or edema noted Skin: No rashes appreciated  Recent Labs  Lab 02/26/18 2014  NA 135  K 3.3*  CL 97*  CO2 30  BUN 13  CREATININE 0.78  GLUCOSE 128*   Recent Labs  Lab 02/26/18 2014  HGB 11.6*  HCT 38.6*  WBC 10.8*  PLT 201   Dg Chest Portable 1 View  Result Date: 02/26/2018 CLINICAL DATA:  Respiratory distress. EXAM: PORTABLE CHEST 1 VIEW COMPARISON:  11/12/2017 FINDINGS: Cardiac silhouette is normal in size. No mediastinal or hilar masses. Lungs are hyperexpanded. There thickened interstitial markings bilaterally. Pleuroparenchymal scarring is noted at the left lung base. Possible small effusions. Interstitial thickening appears mildly increased when compared to the prior exam. No lung consolidation to suggest pneumonia. No pneumothorax. Skeletal structures are grossly intact. IMPRESSION: 1. Suspect interstitial edema superimposed on chronic interstitial thickening/COPD. Possible small pleural effusions. No convincing pneumonia. Electronically Signed   By: Amie Portland M.D.   On: 02/26/2018 20:26    ASSESSMENT / PLAN:  Acute on chronic hypoxic hypercapnic respiratory failure secondary to influenza B and AECOPD Hx: Chronic home O2 @5L  patient is presently on Bipap for dyspnea and/or  hypoxia  Scheduled and prn bronchodilator therapy  IV and nebulized steroids levaquin Will start tamiflu  Droplet precautions   Hypertension Continuous telemetry monitoring Continue caduet and prn hydralazine for bp management   Anemia without acute blood loss  VTE px: subq lovenox Trend CBC  Monitor for s/sx of bleeding and transfuse for hgb <7  Hypokalemia.  Will replace  Hyperglycemia.  Scale coverage  Anemia.  No evidence of active bleeding  Tora Kindred, DO

## 2018-02-27 NOTE — Progress Notes (Addendum)
Patient had 14 beat run of vtach. [name], NP notified. Electrolytes and vss, no new orders currently. Will continue to assess patient.  Douglas Mathis

## 2018-02-27 NOTE — ED Notes (Signed)
Pt moved to hospital bed for comfort; pt able to stand without difficulty to switch beds; incontinent of urine and smears of dark stool; cleaned well at this time; pt given sips of water through straw; appreciative; call bell in reach;

## 2018-02-27 NOTE — Plan of Care (Signed)
Pt alert and oriented. Tolerate well on BiPap with a SaO2 97%. Clear and diminished lung sounds. BP well controlled by prn hydralazine. Will continue to monitor.

## 2018-02-27 NOTE — Plan of Care (Addendum)
Pt alert and oriented. Tolerate well on BiPaP with SaO2 97%. Clear and diminished lung sounds. BP controlled by prn hydralazine. Will continue to monitor.

## 2018-02-27 NOTE — ED Notes (Signed)
RT in to give pt Duoneb

## 2018-02-28 ENCOUNTER — Inpatient Hospital Stay: Payer: Medicare Other

## 2018-02-28 DIAGNOSIS — J449 Chronic obstructive pulmonary disease, unspecified: Secondary | ICD-10-CM

## 2018-02-28 LAB — COMPREHENSIVE METABOLIC PANEL
ALBUMIN: 3.5 g/dL (ref 3.5–5.0)
ALT: 18 U/L (ref 0–44)
AST: 20 U/L (ref 15–41)
Alkaline Phosphatase: 57 U/L (ref 38–126)
Anion gap: 9 (ref 5–15)
BUN: 17 mg/dL (ref 8–23)
CHLORIDE: 97 mmol/L — AB (ref 98–111)
CO2: 31 mmol/L (ref 22–32)
Calcium: 8.9 mg/dL (ref 8.9–10.3)
Creatinine, Ser: 0.73 mg/dL (ref 0.61–1.24)
GFR calc Af Amer: >60 mL/min (ref >60)
GFR calc non Af Amer: >60 mL/min (ref >60)
Glucose, Bld: 179 mg/dL — ABNORMAL HIGH (ref 70–99)
Potassium: 4.3 mmol/L (ref 3.5–5.1)
Sodium: 137 mmol/L (ref 135–145)
Total Bilirubin: 0.6 mg/dL (ref 0.3–1.2)
Total Protein: 6.8 g/dL (ref 6.5–8.1)

## 2018-02-28 LAB — MAGNESIUM: Magnesium: 2.3 mg/dL (ref 1.7–2.4)

## 2018-02-28 LAB — CBC
HCT: 35.3 % — ABNORMAL LOW (ref 39.0–52.0)
Hemoglobin: 10.9 g/dL — ABNORMAL LOW (ref 13.0–17.0)
MCH: 24.6 pg — ABNORMAL LOW (ref 26.0–34.0)
MCHC: 30.9 g/dL (ref 30.0–36.0)
MCV: 79.7 fL — ABNORMAL LOW (ref 80.0–100.0)
Platelets: 201 10*3/uL (ref 150–400)
RBC: 4.43 MIL/uL (ref 4.22–5.81)
RDW: 21.4 % — ABNORMAL HIGH (ref 11.5–15.5)
WBC: 8.1 10*3/uL (ref 4.0–10.5)
nRBC: 0 % (ref 0.0–0.2)

## 2018-02-28 LAB — PHOSPHORUS: Phosphorus: 3.2 mg/dL (ref 2.5–4.6)

## 2018-02-28 MED ORDER — PREDNISONE 20 MG PO TABS
40.0000 mg | ORAL_TABLET | Freq: Every day | ORAL | Status: DC
  Administered 2018-03-01: 40 mg via ORAL
  Filled 2018-02-28: qty 2

## 2018-02-28 MED ORDER — BUDESONIDE 0.5 MG/2ML IN SUSP
0.5000 mg | Freq: Two times a day (BID) | RESPIRATORY_TRACT | Status: DC
  Administered 2018-02-28 – 2018-03-01 (×2): 0.5 mg via RESPIRATORY_TRACT
  Filled 2018-02-28 (×2): qty 2

## 2018-02-28 MED ORDER — BENZONATATE 100 MG PO CAPS: 100.0000 mg | ORAL_CAPSULE | Freq: Three times a day (TID) | ORAL | Status: DC | PRN

## 2018-02-28 MED ORDER — ADULT MULTIVITAMIN W/MINERALS CH
1.0000 | ORAL_TABLET | Freq: Every day | ORAL | Status: DC
  Administered 2018-02-28 – 2018-03-01 (×2): 1 via ORAL
  Filled 2018-02-28 (×2): qty 1

## 2018-02-28 MED ORDER — ENSURE ENLIVE PO LIQD
237.0000 mL | Freq: Two times a day (BID) | ORAL | Status: DC
  Administered 2018-02-28 – 2018-03-01 (×3): 237 mL via ORAL

## 2018-02-28 MED ORDER — METHYLPREDNISOLONE SODIUM SUCC 40 MG IJ SOLR
40.0000 mg | Freq: Three times a day (TID) | INTRAMUSCULAR | Status: AC
  Administered 2018-02-28: 40 mg via INTRAVENOUS
  Filled 2018-02-28: qty 1

## 2018-02-28 NOTE — Progress Notes (Signed)
   02/28/18 0700  Clinical Encounter Type  Visited With Patient  Visit Type Initial;Spiritual support  Recommendations Follow-up as requested.  Spiritual Encounters  Spiritual Needs Emotional;Prayer   A brief visit to offer prayer, presence, and active listening as the patient is recovering. He is concerned about the world and the changes taking place around him. A longer visit may be beneficial.

## 2018-02-28 NOTE — Progress Notes (Addendum)
[employer] - [place] at [facility]   PATIENT NAME: Douglas Mathis    MR#:  [id number]  DATE OF BIRTH:  12-29-1935  SUBJECTIVE:  CHIEF COMPLAINT:   Chief Complaint  Patient presents with  . Shortness of Breath   The patient has no complaints, he wants to go home.  He is on oxygen 5 L by nasal cannula.  He is off BiPAP. REVIEW OF SYSTEMS:  Review of Systems  Constitutional: Negative for chills, fever and malaise/fatigue.  HENT: Negative for sore throat.   Eyes: Negative for blurred vision and double vision.  Respiratory: Negative for cough, hemoptysis, sputum production, shortness of breath, wheezing and stridor.   Cardiovascular: Negative for chest pain, palpitations, orthopnea and leg swelling.  Gastrointestinal: Negative for abdominal pain, blood in stool, diarrhea, melena, nausea and vomiting.  Genitourinary: Negative for dysuria, flank pain and hematuria.  Musculoskeletal: Negative for back pain and joint pain.  Skin: Negative for rash.  Neurological: Negative for dizziness, sensory change, focal weakness, seizures, loss of consciousness, weakness and headaches.  Endo/Heme/Allergies: Negative for polydipsia.  Psychiatric/Behavioral: Negative for depression. The patient is not nervous/anxious.     DRUG ALLERGIES:  No Known Allergies VITALS:  Blood pressure (!) 180/68, pulse 81, temperature 97.8 F (36.6 C), temperature source Oral, resp. rate (!) 22, height 5\' 8"  (1.727 m), weight 73.5 kg, SpO2 92 %. PHYSICAL EXAMINATION:  Physical Exam Constitutional:      General: He is not in acute distress.    Appearance: He is not ill-appearing.     Comments: Moderate malnutrition.  HENT:     Head: Normocephalic.  Eyes:     General: No scleral icterus.    Conjunctiva/sclera: Conjunctivae normal.     Pupils: Pupils are equal, round, and reactive to light.  Neck:     Musculoskeletal: Normal range of motion and neck supple.     Vascular: No JVD.     Trachea:  No tracheal deviation.  Cardiovascular:     Rate and Rhythm: Normal rate and regular rhythm.     Heart sounds: Normal heart sounds. No murmur. No gallop.   Pulmonary:     Effort: Pulmonary effort is normal. No respiratory distress.     Breath sounds: No stridor. No wheezing, rhonchi or rales.     Comments: Diminished breath sounds. Abdominal:     General: Bowel sounds are normal. There is no distension.     Palpations: Abdomen is soft.     Tenderness: There is no abdominal tenderness. There is no rebound.  Musculoskeletal: Normal range of motion.        General: No tenderness.     Right lower leg: No edema.     Left lower leg: No edema.  Skin:    Findings: No erythema or rash.  Neurological:     Mental Status: He is alert and oriented to person, place, and time.     Cranial Nerves: No cranial nerve deficit.  Psychiatric:        Mood and Affect: Mood normal.    LABORATORY PANEL:  Male CBC Recent Labs  Lab 02/28/18 0609  WBC 8.1  HGB 10.9*  HCT 35.3*  PLT 201   ------------------------------------------------------------------------------------------------------------------ Chemistries  Recent Labs  Lab 02/28/18 0609  NA 137  K 4.3  CL 97*  CO2 31  GLUCOSE 179*  BUN 17  CREATININE 0.73  CALCIUM 8.9  MG 2.3  AST 20  ALT 18  ALKPHOS 57  BILITOT 0.6  RADIOLOGY:  Dg Chest Port 1 View  Result Date: 02/28/2018 CLINICAL DATA:  Acute respiratory failure EXAM: PORTABLE CHEST 1 VIEW COMPARISON:  02/26/2018 FINDINGS: Artifact overlies the chest. Heart size upper limits of normal. Chronic aortic atherosclerosis. Right lung is hyperinflated but otherwise clear. Left lung is hyperinflated with chronic pleural and parenchymal scarring at the base. Minimal interstitial edema pattern is improved. IMPRESSION: Radiographic improvement with less interstitial edema. Chronic pleural and parenchymal scarring at the left base. Electronically Signed   By: Paulina Fusi M.D.   On:  02/28/2018 07:09   ASSESSMENT AND PLAN:   *Acute on chronic hypoxic hypercapnic respiratory failure secondary to COPD and influenza B.  The patient has chronic respiratory failure, on home oxygen 5 L. The patient is off BiPAP. Continue nebulizer treatment, taper IV Solu-Medrol, Singulair and Pulmicort, Robitussin as needed.  Discontinue Levaquin. Continue Tamiflu.  *Acute hypokalemia.  Improved with supplement.  Magnesium is normal. *Chronic moderate malnutrition.  Follow-up dietitian. *Accelerated benign essential hypertension, continue home hypertension medication.  IV hydralazine PRN.  *Chronic BPH, continue Flomax.  Tobacco abuse.  Smoking cessation was counseled for 4 minutes, nicotine patch.  Ambulate the patient.  The patient walks independently at home. All the records are reviewed and case discussed with Care Management/Social Worker. Management plans discussed with the patient, family and they are in agreement.  CODE STATUS: Partial Code  TOTAL TIME TAKING CARE OF THIS PATIENT: 27 minutes.   More than 50% of the time was spent in counseling/coordination of care: YES  POSSIBLE D/C IN 1-2 DAYS, DEPENDING ON CLINICAL CONDITION.   Shaune Pollack M.D on 02/28/2018 at 3:20 PM  Between 7am to 6pm - Pager - [phone]  After 6pm go to [email or web address] - Therapist, nutritional Hospitalists

## 2018-02-28 NOTE — Progress Notes (Signed)
Patient able to sleep through the night.

## 2018-02-28 NOTE — Progress Notes (Signed)
Initial Nutrition Assessment  DOCUMENTATION CODES:   Not applicable  INTERVENTION:   -MVI with minerals daily -Ensure Enlive po BID, each supplement provides 350 kcal and 20 grams of protein  NUTRITION DIAGNOSIS:   Increased nutrient needs related to chronic illness(COPD) as evidenced by estimated needs.  GOAL:   Patient will meet greater than or equal to 90% of their needs  MONITOR:   PO intake, Supplement acceptance, Labs, Weight trends, Skin, I & O's  REASON FOR ASSESSMENT:   Consult Assessment of nutrition requirement/status  ASSESSMENT:   Douglas Mathis  is a 83 y.o. male with a known history per below presented to the emergency room with 3-day history of worsening nonproductive cough, generalized weakness, fatigue, dyspnea on exertion, brought to the hospital via EMS, was 87% on 5 L which is his chronic O2 requirement at home, in the emergency room patient was tachycardic, hypertensive, potassium 3.3, venous blood gas noted for respiratory acidosis on venous blood gas, patient seen in the emergency room, noted mild to moderate respiratory distress, plans are for BiPAP, patient now be admitted for acute on chronic hypoxic hypercapnic respiratory failure due to COPD exacerbation.   Pt admitted with COPD exacerbation.  1/5- weaned off Bi-pap 1/6- transferred from ICU to floor  Pt was sleeping at time of visit, however, arose to voice. Pt reports feeling well today and ate a "great" breakfast of oatmeal, bacon, and toast. Pt reports he consumed 100% of meal today (pt consumed 50% of dinner last night per doc flowsheets). At baseline, pt reports consuming 2 meals per day ("I don't do enough to eat 3"). Morning meal is usually late morning and pt also consumes an evening meal. Pt was very vague about details pertaining of what his meals from home consisted of despite RD probing, but reports "I'll eat everything and everything; I'll let you know more later". Pt does some cooking  at home, but is also assisted in meal preparation by his daughter and a friend.   Pt denies any weight loss. He shares his UBW is around 200#. However, documented wt hx reveals a steady downward trend over the past 8 months. Noted pt has experienced a 9.4% wt loss over the past 4 months, which is significant for time frame.   Discussed with pt importance of good meal and supplement intake to promote healing. Pt agreeable to supplements.   Labs reviewed: CBGS: 163. Medications reviewed and include solu-medrol, which is likely contributing to elevated CBGS.   NUTRITION - FOCUSED PHYSICAL EXAM:    Most Recent Value  Orbital Region  No depletion  Upper Arm Region  No depletion  Thoracic and Lumbar Region  No depletion  Buccal Region  No depletion  Temple Region  Mild depletion  Clavicle Bone Region  No depletion  Clavicle and Acromion Bone Region  No depletion  Scapular Bone Region  No depletion  Dorsal Hand  No depletion  Patellar Region  No depletion  Anterior Thigh Region  Mild depletion  Posterior Calf Region  Mild depletion  Edema (RD Assessment)  None  Hair  Reviewed  Eyes  Reviewed  Mouth  Reviewed  Skin  Reviewed  Nails  Reviewed       Diet Order:   Diet Order            Diet Heart Room service appropriate? Yes; Fluid consistency: Thin  Diet effective now              EDUCATION NEEDS:  Education needs have been addressed  Skin:  Skin Assessment: Reviewed RN Assessment  Last BM:  02/27/18  Height:   Ht Readings from Last 1 Encounters:  02/27/18 5\' 8"  (1.727 m)    Weight:   Wt Readings from Last 1 Encounters:  02/27/18 73.5 kg    Ideal Body Weight:  70 kg  BMI:  Body mass index is 24.64 kg/m.  Estimated Nutritional Needs:   Kcal:  1850-2050  Protein:  85-100 grams  Fluid:  1.8-2.0 L    Areal Cochrane A. Mayford Knife, RD, LDN, CDE Pager: [phone] After hours Pager: [phone]

## 2018-02-28 NOTE — Progress Notes (Signed)
Name: Douglas Mathis MRN: [id number] DOB: 1935-06-06    ADMISSION DATE:  02/26/2018 CONSULTATION DATE: 02/26/2017  REFERRING MD : Dr. Katheren Shams  CHIEF COMPLAINT: Shortness of Breath   BRIEF PATIENT DESCRIPTION:  83 yo male admitted with acute on chronic hypercapnic hypoxic respiratory failure secondary to AECOPD and influenza B requiring Bipap   SIGNIFICANT EVENTS  01/5-Pt admitted to the stepdown unit   HISTORY OF PRESENT ILLNESS:   This is an 83 yo male with a PMH of HTN, Hypercholesterolemia, COPD, Chronic Home O2 @5L , BPH, and Anemia.  He presented to [facility] ER via on 01/4 with worsening shortness of breath and nonproductive cough onset of symptoms several days prior to presentation.  Per ER notes EMS reported pt hypoxic with O2 sats at 87% on 5L via nasal canula.  He received iv solumedrol and nebulizer treatment x2 en route to the ER with slight improvement of symptoms.  In the ER lab results revealed K+ 3.3, chloride 97, wbc 10.8, hgb 11.6, and vbg pH 7.28/pCO2 74/bicarb 34.8.  CXR negative for pneumonia, however infuenza pcr positive for influenza B.  Due to continued respiratory failure pt placed on Bipap.  He was subsequently admitted to the stepdown unit for additional workup and treatment.   SUBJECTIVE: No acute issues overnight. Doing well on [place].  VITAL SIGNS: Temp:  [97.2 F (36.2 C)-98.1 F (36.7 C)] 98.1 F (36.7 C) (01/05 2345) Pulse Rate:  [54-81] 60 (01/06 0400) Resp:  [15-31] 28 (01/06 0400) BP: (65-163)/(47-92) 145/81 (01/06 0400) SpO2:  [92 %-100 %] 95 % (01/06 0400) FiO2 (%):  [35 %] 35 % (01/05 1444) Weight:  [73.5 kg] 73.5 kg (01/05 0832)  PHYSICAL EXAMINATION: General: Patient is awake, alert, in NAD Neuro: Moves all extremities, cranial nerves grossly intact, no focal deficits appreciated HEENT: Trachea midline, no thyromegaly noted, no jugular venous distention noted Cardiovascular: Regular rhythm, bradycardia appreciated Lungs: breath sounds improved  bilaterally, prolonged expiratory phase, diffuse expiratory wheezing Abdomen: Bowel sounds, soft exam Musculoskeletal: No clubbing, cyanosis or edema noted Skin: No rashes appreciated  Recent Labs  Lab 02/26/18 2014 02/27/18 0721  NA 135  --   K 3.3* 4.2  CL 97*  --   CO2 30  --   BUN 13  --   CREATININE 0.78 0.77  GLUCOSE 128*  --    Recent Labs  Lab 02/26/18 2014 02/27/18 0721  HGB 11.6* 10.4*  HCT 38.6* 34.4*  WBC 10.8* 4.0  PLT 201 177   Dg Chest Portable 1 View  Result Date: 02/26/2018 CLINICAL DATA:  Respiratory distress. EXAM: PORTABLE CHEST 1 VIEW COMPARISON:  11/12/2017 FINDINGS: Cardiac silhouette is normal in size. No mediastinal or hilar masses. Lungs are hyperexpanded. There thickened interstitial markings bilaterally. Pleuroparenchymal scarring is noted at the left lung base. Possible small effusions. Interstitial thickening appears mildly increased when compared to the prior exam. No lung consolidation to suggest pneumonia. No pneumothorax. Skeletal structures are grossly intact. IMPRESSION: 1. Suspect interstitial edema superimposed on chronic interstitial thickening/COPD. Possible small pleural effusions. No convincing pneumonia. Electronically Signed   By: Amie Portland M.D.   On: 02/26/2018 20:26    ASSESSMENT / PLAN:  Acute on chronic hypoxic hypercapnic respiratory failure secondary to influenza B and AECOPD Hx: Chronic home O2 @5L  patient is presently on Bipap for dyspnea and/or hypoxia  Scheduled and prn bronchodilator therapy  IV and nebulized steroids levaquin Will start tamiflu  Droplet precautions   Hypertension Continuous telemetry monitoring Continue caduet and prn  hydralazine for bp management   Anemia without acute blood loss  VTE px: subq lovenox Trend CBC  Monitor for s/sx of bleeding and transfuse for hgb <7  Hypokalemia.  Will replace  Hyperglycemia.  Scale coverage  Anemia.  No evidence of active bleeding  Patient is stable  for transfer out of the ICU   Darria Corvera S. [facility] ANP-BC Pulmonary and Critical Care Medicine [facility] Pager [phone] or [phone]  NB: This document was prepared using Dragon voice recognition software and may include unintentional dictation errors.

## 2018-03-01 MED ORDER — METOPROLOL TARTRATE 37.5 MG PO TABS: 37.5000 mg | ORAL_TABLET | Freq: Two times a day (BID) | ORAL | 0 refills | Status: DC

## 2018-03-01 MED ORDER — HYDROCHLOROTHIAZIDE 25 MG PO TABS: 25.0000 mg | ORAL_TABLET | Freq: Every day | ORAL | Status: DC

## 2018-03-01 MED ORDER — OSELTAMIVIR PHOSPHATE 75 MG PO CAPS: 75.0000 mg | ORAL_CAPSULE | Freq: Two times a day (BID) | ORAL | 0 refills | Status: DC

## 2018-03-01 MED ORDER — PREDNISONE 20 MG PO TABS: 40.0000 mg | ORAL_TABLET | Freq: Every day | ORAL | 0 refills | Status: DC

## 2018-03-01 MED ORDER — BENZONATATE 100 MG PO CAPS: 100.0000 mg | ORAL_CAPSULE | Freq: Three times a day (TID) | ORAL | 0 refills | Status: DC | PRN

## 2018-03-01 MED ORDER — HYDROCHLOROTHIAZIDE 25 MG PO TABS
25.0000 mg | ORAL_TABLET | Freq: Every day | ORAL | Status: DC
  Administered 2018-03-01: 25 mg via ORAL
  Filled 2018-03-01: qty 1

## 2018-03-01 NOTE — Discharge Summary (Addendum)
[facility] [place] [facility]   PATIENT NAME: Douglas Mathis    MR#:  [id number]  DATE OF BIRTH:  1935-04-11  DATE OF ADMISSION:  02/26/2018   ADMITTING PHYSICIAN: Bertrum Sol, MD  DATE OF DISCHARGE: 03/01/2018    PRIMARY CARE PHYSICIAN: [facility]   ADMISSION DIAGNOSIS:  COPD exacerbation (HCC) [J44.1] Acute respiratory failure with hypoxia (HCC) [J96.01] Respiratory failure (HCC) [J96.90] DISCHARGE DIAGNOSIS:  Active Problems:   COPD (chronic obstructive pulmonary disease) (HCC)   Respiratory failure (HCC)  SECONDARY DIAGNOSIS:   Past Medical History:  Diagnosis Date  . Anemia   . BPH (benign prostatic hyperplasia)   . COPD (chronic obstructive pulmonary disease) (HCC)   . Hypercholesterolemia   . Hypertension    HOSPITAL COURSE:  *Acute on chronic hypoxic hypercapnic respiratory failure secondary to COPD and influenza B.  The patient has chronic respiratory failure, on home oxygen 5 L. The patient is off BiPAP. Continue nebulizer treatment, tapered IV Solu-Medrol, changed to prednisone p.o.  Continue Singulair and Pulmicort, Robitussin as needed.  Discontinued Levaquin. Continue Tamiflu.  *Acute hypokalemia.  Improved with supplement.  Magnesium is normal. *Chronic moderate malnutrition.  Follow-up dietitian. *Accelerated benign essential hypertension, continue home hypertension medication.  IV hydralazine PRN.  *Chronic BPH, continue Flomax.  Tobacco abuse.  Smoking cessation was counseled for 4 minutes, nicotine patch.  Ambulate the patient.  The patient walks independently at home. DISCHARGE CONDITIONS:  Stable, discharge to home today. CONSULTS OBTAINED:  Treatment Team:  Tora Kindred, DO DRUG ALLERGIES:  No Known Allergies DISCHARGE MEDICATIONS:   Allergies as of 03/01/2018   No Known Allergies     Medication List    TAKE these medications   ADVAIR DISKUS IN Inhale 1 puff into the lungs 2  (two) times daily.   amlodipine-atorvastatin 10-20 MG tablet Commonly known as:  CADUET Take 1 tablet by mouth daily.   benzonatate 100 MG capsule Commonly known as:  TESSALON Take 1 capsule (100 mg total) by mouth 3 (three) times daily as needed for cough.   DOK 100 MG capsule Generic drug:  docusate sodium Take 100 mg by mouth 2 (two) times daily.   ferrous sulfate 325 (65 FE) MG tablet Take 1 tablet (325 mg total) by mouth 2 (two) times daily with a meal.   GNP VITAMIN C 500 MG tablet Generic drug:  ascorbic acid Take 500 mg by mouth 2 (two) times daily.   ipratropium-albuterol 0.5-2.5 (3) MG/3ML Soln Commonly known as:  DUONEB Take 3 mLs by nebulization every 6 (six) hours as needed.   lovastatin 40 MG tablet Commonly known as:  MEVACOR Take 40 mg by mouth at bedtime.   Metoprolol Tartrate 37.5 MG Tabs Take 37.5 mg by mouth 2 (two) times daily.   montelukast 10 MG tablet Commonly known as:  SINGULAIR Take 10 mg by mouth daily.   nicotine 21 mg/24hr patch Commonly known as:  NICODERM CQ - dosed in mg/24 hours Place 1 patch (21 mg total) onto the skin daily.   oseltamivir 75 MG capsule Commonly known as:  TAMIFLU Take 1 capsule (75 mg total) by mouth 2 (two) times daily.   predniSONE 20 MG tablet Commonly known as:  DELTASONE Take 2 tablets (40 mg total) by mouth daily with breakfast. Start taking on:  March 02, 2018 What changed:    medication strength  how much to take  how to take this  when to take this  additional instructions   PROAIR HFA 108 (90 Base) MCG/ACT inhaler Generic drug:  albuterol Inhale 2 puffs into the lungs 4 (four) times daily as needed for wheezing or shortness of breath.   tamsulosin 0.4 MG Caps capsule Commonly known as:  FLOMAX Take 0.4 mg by mouth daily after breakfast.        DISCHARGE INSTRUCTIONS:  See AVS.  If you experience worsening of your admission symptoms, develop shortness of breath, life threatening  emergency, suicidal or homicidal thoughts you must seek medical attention immediately by calling 911 or calling your MD immediately  if symptoms less severe.  You Must read complete instructions/literature along with all the possible adverse reactions/side effects for all the Medicines you take and that have been prescribed to you. Take any new Medicines after you have completely understood and accpet all the possible adverse reactions/side effects.   Please note  You were cared for by a hospitalist during your hospital stay. If you have any questions about your discharge medications or the care you received while you were in the hospital after you are discharged, you can call the unit and asked to speak with the hospitalist on call if the hospitalist that took care of you is not available. Once you are discharged, your primary care physician will handle any further medical issues. Please note that NO REFILLS for any discharge medications will be authorized once you are discharged, as it is imperative that you return to your primary care physician (or establish a relationship with a primary care physician if you do not have one) for your aftercare needs so that they can reassess your need for medications and monitor your lab values.    On the day of Discharge:  VITAL SIGNS:  Blood pressure (!) 153/82, pulse 82, temperature 98 F (36.7 C), resp. rate 18, height 5\' 8"  (1.727 m), weight 73.5 kg, SpO2 92 %. PHYSICAL EXAMINATION:  GENERAL:  83 y.o.-year-old patient lying in the bed with no acute distress.  Moderate malnutrition. EYES: Pupils equal, round, reactive to light and accommodation. No scleral icterus. Extraocular muscles intact.  HEENT: Head atraumatic, normocephalic. Oropharynx and nasopharynx clear.  NECK:  Supple, no jugular venous distention. No thyroid enlargement, no tenderness.  LUNGS: Normal breath sounds bilaterally, no wheezing, rales,rhonchi or crepitation. No use of accessory  muscles of respiration.  CARDIOVASCULAR: S1, S2 normal. No murmurs, rubs, or gallops.  ABDOMEN: Soft, non-tender, non-distended. Bowel sounds present. No organomegaly or mass.  EXTREMITIES: No pedal edema, cyanosis, or clubbing.  NEUROLOGIC: Cranial nerves II through XII are intact. Muscle strength 5/5 in all extremities. Sensation intact. Gait not checked.  PSYCHIATRIC: The patient is alert and oriented x 3.  SKIN: No obvious rash, lesion, or ulcer.  DATA REVIEW:   CBC Recent Labs  Lab 02/28/18 0609  WBC 8.1  HGB 10.9*  HCT 35.3*  PLT 201    Chemistries  Recent Labs  Lab 02/28/18 0609  NA 137  K 4.3  CL 97*  CO2 31  GLUCOSE 179*  BUN 17  CREATININE 0.73  CALCIUM 8.9  MG 2.3  AST 20  ALT 18  ALKPHOS 57  BILITOT 0.6     Microbiology Results  Results for orders placed or performed during the hospital encounter of 02/26/18  MRSA PCR Screening     Status: None   Collection Time: 02/27/18  8:40 AM  Result Value Ref Range Status   MRSA by PCR NEGATIVE NEGATIVE Final    Comment:  The GeneXpert MRSA Assay (FDA approved for NASAL specimens only), is one component of a comprehensive MRSA colonization surveillance program. It is not intended to diagnose MRSA infection nor to guide or monitor treatment for MRSA infections. Performed at [facility], [address], [place], [place] [address]     RADIOLOGY:  No results found.   Management plans discussed with the patient, family and they are in agreement.  CODE STATUS: Partial Code   TOTAL TIME TAKING CARE OF THIS PATIENT: 33 minutes.    Shaune Pollack M.D on 03/01/2018 at 12:23 PM  Between 7am to 6pm - Pager - [phone]  After 6pm go to [email or web address] - Scientist, research (life sciences) [facility]  Office  [phone]  CC: Primary care physician; [facility]   Note: This dictation was prepared with Nurse, children's dictation along with smaller phrase  technology. Any transcriptional errors that result from this process are unintentional.

## 2018-03-01 NOTE — Progress Notes (Signed)
Mariel Aloe Kosinski  A and O x 4. VSS. Pt tolerating diet well. No complaints of pain or nausea. IV removed intact, prescriptions given. Pt voiced understanding of discharge instructions with no further questions. Pt discharged via wheelchair with axillary.    Allergies as of 03/01/2018   No Known Allergies     Medication List    TAKE these medications   ADVAIR DISKUS IN Inhale 1 puff into the lungs 2 (two) times daily.   amlodipine-atorvastatin 10-20 MG tablet Commonly known as:  CADUET Take 1 tablet by mouth daily.   benzonatate 100 MG capsule Commonly known as:  TESSALON Take 1 capsule (100 mg total) by mouth 3 (three) times daily as needed for cough.   DOK 100 MG capsule Generic drug:  docusate sodium Take 100 mg by mouth 2 (two) times daily.   ferrous sulfate 325 (65 FE) MG tablet Take 1 tablet (325 mg total) by mouth 2 (two) times daily with a meal.   GNP VITAMIN C 500 MG tablet Generic drug:  ascorbic acid Take 500 mg by mouth 2 (two) times daily.   ipratropium-albuterol 0.5-2.5 (3) MG/3ML Soln Commonly known as:  DUONEB Take 3 mLs by nebulization every 6 (six) hours as needed.   lovastatin 40 MG tablet Commonly known as:  MEVACOR Take 40 mg by mouth at bedtime.   Metoprolol Tartrate 37.5 MG Tabs Take 37.5 mg by mouth 2 (two) times daily.   montelukast 10 MG tablet Commonly known as:  SINGULAIR Take 10 mg by mouth daily.   nicotine 21 mg/24hr patch Commonly known as:  NICODERM CQ - dosed in mg/24 hours Place 1 patch (21 mg total) onto the skin daily.   oseltamivir 75 MG capsule Commonly known as:  TAMIFLU Take 1 capsule (75 mg total) by mouth 2 (two) times daily.   predniSONE 20 MG tablet Commonly known as:  DELTASONE Take 2 tablets (40 mg total) by mouth daily with breakfast. Start taking on:  March 02, 2018 What changed:    medication strength  how much to take  how to take this  when to take this  additional instructions   PROAIR HFA 108  (90 Base) MCG/ACT inhaler Generic drug:  albuterol Inhale 2 puffs into the lungs 4 (four) times daily as needed for wheezing or shortness of breath.   tamsulosin 0.4 MG Caps capsule Commonly known as:  FLOMAX Take 0.4 mg by mouth daily after breakfast.       Vitals:   03/01/18 0648 03/01/18 1102  BP: (!) 183/64 (!) 153/82  Pulse: 67 82  Resp:  18  Temp:  98 F (36.7 C)  SpO2:  92%    Suzzanne Cloud

## 2018-03-01 NOTE — Care Management (Signed)
Per nursing staff patient has chronic O2 at 5L.  Portable o2 was brought for discharge.  Per bedside RN patient ambulated independently and there were no needs at discharge.

## 2018-03-01 NOTE — Discharge Instructions (Signed)
Smoking cessation  

## 2018-03-19 ENCOUNTER — Inpatient Hospital Stay
Admission: EM | Admit: 2018-03-19 | Discharge: 2018-03-22 | DRG: 291 | Disposition: A | Discharge status: Home / Self Care | Payer: Medicare Other | Attending: Internal Medicine | Admitting: Internal Medicine

## 2018-03-19 ENCOUNTER — Emergency Department: Payer: Medicare Other

## 2018-03-19 ENCOUNTER — Encounter: Payer: Self-pay | Admitting: Emergency Medicine

## 2018-03-19 ENCOUNTER — Other Ambulatory Visit: Payer: Self-pay

## 2018-03-19 DIAGNOSIS — Z7951 Long term (current) use of inhaled steroids: Secondary | ICD-10-CM

## 2018-03-19 DIAGNOSIS — Z7952 Long term (current) use of systemic steroids: Secondary | ICD-10-CM | POA: Diagnosis not present

## 2018-03-19 DIAGNOSIS — F17210 Nicotine dependence, cigarettes, uncomplicated: Secondary | ICD-10-CM | POA: Diagnosis present

## 2018-03-19 DIAGNOSIS — R7989 Other specified abnormal findings of blood chemistry: Secondary | ICD-10-CM

## 2018-03-19 DIAGNOSIS — Z9981 Dependence on supplemental oxygen: Secondary | ICD-10-CM | POA: Diagnosis not present

## 2018-03-19 DIAGNOSIS — R269 Unspecified abnormalities of gait and mobility: Secondary | ICD-10-CM | POA: Diagnosis present

## 2018-03-19 DIAGNOSIS — Z809 Family history of malignant neoplasm, unspecified: Secondary | ICD-10-CM

## 2018-03-19 DIAGNOSIS — Z66 Do not resuscitate: Secondary | ICD-10-CM | POA: Diagnosis present

## 2018-03-19 DIAGNOSIS — I110 Hypertensive heart disease with heart failure: Principal | ICD-10-CM | POA: Diagnosis present

## 2018-03-19 DIAGNOSIS — E785 Hyperlipidemia, unspecified: Secondary | ICD-10-CM | POA: Diagnosis present

## 2018-03-19 DIAGNOSIS — J441 Chronic obstructive pulmonary disease with (acute) exacerbation: Secondary | ICD-10-CM

## 2018-03-19 DIAGNOSIS — J9610 Chronic respiratory failure, unspecified whether with hypoxia or hypercapnia: Secondary | ICD-10-CM | POA: Diagnosis present

## 2018-03-19 DIAGNOSIS — Z825 Family history of asthma and other chronic lower respiratory diseases: Secondary | ICD-10-CM

## 2018-03-19 DIAGNOSIS — I248 Other forms of acute ischemic heart disease: Secondary | ICD-10-CM | POA: Diagnosis present

## 2018-03-19 DIAGNOSIS — J209 Acute bronchitis, unspecified: Secondary | ICD-10-CM | POA: Diagnosis present

## 2018-03-19 DIAGNOSIS — J810 Acute pulmonary edema: Secondary | ICD-10-CM | POA: Diagnosis not present

## 2018-03-19 DIAGNOSIS — I5031 Acute diastolic (congestive) heart failure: Secondary | ICD-10-CM | POA: Diagnosis present

## 2018-03-19 DIAGNOSIS — N400 Enlarged prostate without lower urinary tract symptoms: Secondary | ICD-10-CM | POA: Diagnosis present

## 2018-03-19 DIAGNOSIS — J440 Chronic obstructive pulmonary disease with acute lower respiratory infection: Secondary | ICD-10-CM | POA: Diagnosis present

## 2018-03-19 DIAGNOSIS — H02846 Edema of left eye, unspecified eyelid: Secondary | ICD-10-CM | POA: Diagnosis present

## 2018-03-19 DIAGNOSIS — H409 Unspecified glaucoma: Secondary | ICD-10-CM | POA: Diagnosis present

## 2018-03-19 DIAGNOSIS — H5462 Unqualified visual loss, left eye, normal vision right eye: Secondary | ICD-10-CM | POA: Diagnosis present

## 2018-03-19 DIAGNOSIS — I501 Left ventricular failure: Secondary | ICD-10-CM | POA: Diagnosis present

## 2018-03-19 DIAGNOSIS — Z8249 Family history of ischemic heart disease and other diseases of the circulatory system: Secondary | ICD-10-CM

## 2018-03-19 DIAGNOSIS — I071 Rheumatic tricuspid insufficiency: Secondary | ICD-10-CM | POA: Diagnosis present

## 2018-03-19 DIAGNOSIS — E871 Hypo-osmolality and hyponatremia: Secondary | ICD-10-CM | POA: Diagnosis present

## 2018-03-19 DIAGNOSIS — Z79899 Other long term (current) drug therapy: Secondary | ICD-10-CM

## 2018-03-19 DIAGNOSIS — R778 Other specified abnormalities of plasma proteins: Secondary | ICD-10-CM

## 2018-03-19 LAB — BASIC METABOLIC PANEL
Anion gap: 6 (ref 5–15)
BUN: 11 mg/dL (ref 8–23)
CALCIUM: 8.3 mg/dL — AB (ref 8.9–10.3)
CO2: 30 mmol/L (ref 22–32)
CREATININE: 0.85 mg/dL (ref 0.61–1.24)
Chloride: 98 mmol/L (ref 98–111)
GFR calc Af Amer: >60 mL/min (ref >60)
GFR calc non Af Amer: >60 mL/min (ref >60)
Glucose, Bld: 142 mg/dL — ABNORMAL HIGH (ref 70–99)
Potassium: 4.1 mmol/L (ref 3.5–5.1)
Sodium: 134 mmol/L — ABNORMAL LOW (ref 135–145)

## 2018-03-19 LAB — CBC
HCT: 25.6 % — ABNORMAL LOW (ref 39.0–52.0)
Hemoglobin: 8 g/dL — ABNORMAL LOW (ref 13.0–17.0)
MCH: 24.6 pg — ABNORMAL LOW (ref 26.0–34.0)
MCHC: 31.3 g/dL (ref 30.0–36.0)
MCV: 78.8 fL — ABNORMAL LOW (ref 80.0–100.0)
PLATELETS: 214 10*3/uL (ref 150–400)
RBC: 3.25 MIL/uL — ABNORMAL LOW (ref 4.22–5.81)
RDW: 21.3 % — AB (ref 11.5–15.5)
WBC: 19.6 10*3/uL — ABNORMAL HIGH (ref 4.0–10.5)
nRBC: 0 % (ref 0.0–0.2)

## 2018-03-19 LAB — URINALYSIS, COMPLETE (UACMP) WITH MICROSCOPIC
BILIRUBIN URINE: NEGATIVE
Glucose, UA: NEGATIVE mg/dL
Ketones, ur: NEGATIVE mg/dL
Nitrite: NEGATIVE
Protein, ur: NEGATIVE mg/dL
SPECIFIC GRAVITY, URINE: 1.013 (ref 1.005–1.030)
pH: 5 (ref 5.0–8.0)

## 2018-03-19 LAB — BRAIN NATRIURETIC PEPTIDE: B Natriuretic Peptide: 264 pg/mL — ABNORMAL HIGH (ref 0.0–100.0)

## 2018-03-19 LAB — TROPONIN I: Troponin I: 0.1 ng/mL (ref <0.03)

## 2018-03-19 MED ORDER — ACETAMINOPHEN 650 MG RE SUPP: 650.0000 mg | Freq: Four times a day (QID) | RECTAL | Status: DC | PRN

## 2018-03-19 MED ORDER — PRAVASTATIN SODIUM 40 MG PO TABS
40.0000 mg | ORAL_TABLET | Freq: Every day | ORAL | Status: DC
  Administered 2018-03-19 – 2018-03-21 (×3): 40 mg via ORAL
  Filled 2018-03-19 (×3): qty 1

## 2018-03-19 MED ORDER — FERROUS SULFATE 325 (65 FE) MG PO TABS
325.0000 mg | ORAL_TABLET | Freq: Two times a day (BID) | ORAL | Status: DC
  Administered 2018-03-19 – 2018-03-22 (×6): 325 mg via ORAL
  Filled 2018-03-19 (×6): qty 1

## 2018-03-19 MED ORDER — IPRATROPIUM-ALBUTEROL 0.5-2.5 (3) MG/3ML IN SOLN: 3.0000 mL | Freq: Four times a day (QID) | RESPIRATORY_TRACT | Status: DC | PRN

## 2018-03-19 MED ORDER — SODIUM CHLORIDE 0.9% FLUSH
3.0000 mL | Freq: Once | INTRAVENOUS | Status: AC
  Administered 2018-03-19: 3 mL via INTRAVENOUS

## 2018-03-19 MED ORDER — ONDANSETRON HCL 4 MG PO TABS: 4.0000 mg | ORAL_TABLET | Freq: Four times a day (QID) | ORAL | Status: DC | PRN

## 2018-03-19 MED ORDER — TAMSULOSIN HCL 0.4 MG PO CAPS
0.4000 mg | ORAL_CAPSULE | Freq: Every day | ORAL | Status: DC
  Administered 2018-03-20 – 2018-03-22 (×3): 0.4 mg via ORAL
  Filled 2018-03-19 (×3): qty 1

## 2018-03-19 MED ORDER — ALBUTEROL SULFATE (2.5 MG/3ML) 0.083% IN NEBU: 2.5000 mg | INHALATION_SOLUTION | RESPIRATORY_TRACT | Status: DC | PRN

## 2018-03-19 MED ORDER — VITAMIN C 500 MG PO TABS
500.0000 mg | ORAL_TABLET | Freq: Two times a day (BID) | ORAL | Status: DC
  Administered 2018-03-19 – 2018-03-22 (×6): 500 mg via ORAL
  Filled 2018-03-19 (×6): qty 1

## 2018-03-19 MED ORDER — MONTELUKAST SODIUM 10 MG PO TABS
10.0000 mg | ORAL_TABLET | Freq: Every day | ORAL | Status: DC
  Administered 2018-03-19 – 2018-03-21 (×3): 10 mg via ORAL
  Filled 2018-03-19 (×3): qty 1

## 2018-03-19 MED ORDER — AMLODIPINE BESYLATE 5 MG PO TABS
10.0000 mg | ORAL_TABLET | Freq: Every day | ORAL | Status: DC
  Administered 2018-03-19 – 2018-03-22 (×4): 10 mg via ORAL
  Filled 2018-03-19 (×4): qty 2

## 2018-03-19 MED ORDER — ALBUTEROL SULFATE HFA 108 (90 BASE) MCG/ACT IN AERS: 2.0000 | INHALATION_SPRAY | Freq: Four times a day (QID) | RESPIRATORY_TRACT | Status: DC | PRN

## 2018-03-19 MED ORDER — MOMETASONE FURO-FORMOTEROL FUM 100-5 MCG/ACT IN AERO
2.0000 | INHALATION_SPRAY | Freq: Two times a day (BID) | RESPIRATORY_TRACT | Status: DC
  Administered 2018-03-19 – 2018-03-22 (×6): 2 via RESPIRATORY_TRACT
  Filled 2018-03-19: qty 8.8

## 2018-03-19 MED ORDER — ENOXAPARIN SODIUM 40 MG/0.4ML ~~LOC~~ SOLN
40.0000 mg | SUBCUTANEOUS | Status: DC
  Administered 2018-03-19 – 2018-03-21 (×3): 40 mg via SUBCUTANEOUS
  Filled 2018-03-19 (×3): qty 0.4

## 2018-03-19 MED ORDER — FUROSEMIDE 10 MG/ML IJ SOLN
40.0000 mg | Freq: Once | INTRAMUSCULAR | Status: AC
  Administered 2018-03-19: 40 mg via INTRAVENOUS
  Filled 2018-03-19: qty 4

## 2018-03-19 MED ORDER — POLYETHYLENE GLYCOL 3350 17 G PO PACK: 17.0000 g | PACK | Freq: Every day | ORAL | Status: DC | PRN

## 2018-03-19 MED ORDER — ACETAMINOPHEN 325 MG PO TABS
650.0000 mg | ORAL_TABLET | Freq: Four times a day (QID) | ORAL | Status: DC | PRN
  Administered 2018-03-19: 650 mg via ORAL
  Filled 2018-03-19: qty 2

## 2018-03-19 MED ORDER — SENNA 8.6 MG PO TABS
1.0000 | ORAL_TABLET | Freq: Two times a day (BID) | ORAL | Status: DC
  Administered 2018-03-19 – 2018-03-22 (×7): 8.6 mg via ORAL
  Filled 2018-03-19 (×7): qty 1

## 2018-03-19 MED ORDER — FLUTICASONE-SALMETEROL 100-50 MCG/DOSE IN AEPB
1.0000 | INHALATION_SPRAY | Freq: Two times a day (BID) | RESPIRATORY_TRACT | Status: DC
  Filled 2018-03-19: qty 14

## 2018-03-19 MED ORDER — METOPROLOL TARTRATE 25 MG PO TABS
37.5000 mg | ORAL_TABLET | Freq: Two times a day (BID) | ORAL | Status: DC
  Administered 2018-03-19 – 2018-03-22 (×6): 37.5 mg via ORAL
  Filled 2018-03-19 (×6): qty 2

## 2018-03-19 MED ORDER — FUROSEMIDE 10 MG/ML IJ SOLN
20.0000 mg | Freq: Two times a day (BID) | INTRAMUSCULAR | Status: DC
  Administered 2018-03-19 – 2018-03-20 (×2): 20 mg via INTRAVENOUS
  Filled 2018-03-19 (×2): qty 2

## 2018-03-19 MED ORDER — IPRATROPIUM-ALBUTEROL 0.5-2.5 (3) MG/3ML IN SOLN
3.0000 mL | Freq: Once | RESPIRATORY_TRACT | Status: AC
  Administered 2018-03-19: 3 mL via RESPIRATORY_TRACT
  Filled 2018-03-19: qty 3

## 2018-03-19 MED ORDER — ONDANSETRON HCL 4 MG/2ML IJ SOLN: 4.0000 mg | Freq: Four times a day (QID) | INTRAMUSCULAR | Status: DC | PRN

## 2018-03-19 MED ORDER — PANTOPRAZOLE SODIUM 40 MG PO TBEC
40.0000 mg | DELAYED_RELEASE_TABLET | Freq: Two times a day (BID) | ORAL | Status: DC
  Administered 2018-03-19 – 2018-03-22 (×6): 40 mg via ORAL
  Filled 2018-03-19 (×6): qty 1

## 2018-03-19 NOTE — H&P (Signed)
[facility] [place] [facility]   PATIENT NAME: Douglas Mathis    MR#:  [id number]  DATE OF BIRTH:  1935/04/17  DATE OF ADMISSION:  03/19/2018  PRIMARY CARE PHYSICIAN: [facility]   REQUESTING/REFERRING PHYSICIAN: Dr. Cyril LoosenKinner  CHIEF COMPLAINT:   Increasing shortness of breath for two days HISTORY OF PRESENT ILLNESS:  Douglas Mathis  is a 83 y.o. male with a known history of chronic respiratory failure/COPD on chronic 2.5 L nasal cannula oxygen, hypertension, BPH and glaucoma with chronic left eye loss of vision comes to the emergency room with increasing shortness of breath and leg swelling. Patient noticed more short winded nurse different from his COPD spells and some leg swelling. He also noticed left eye lid swelling since yesterday morning. Not remember any insect bite on her left eye. Denies any pain on the left eye. Workup in the  ER showed patient has pulmonary edema on x-ray consistent with congestive heart failure new onset acute diastolic. Echo in September 2019 showed EF of 60 to 65%.  Received 40 mg of IV Lasix in the ER. He is being admitted for further evaluation of management. Daughters in the room PAST MEDICAL HISTORY:   Past Medical History:  Diagnosis Date  . Anemia   . BPH (benign prostatic hyperplasia)   . COPD (chronic obstructive pulmonary disease) (HCC)   . Hypercholesterolemia   . Hypertension     PAST SURGICAL HISTOIRY:   Past Surgical History:  Procedure Laterality Date  . cataracts    . ESOPHAGOGASTRODUODENOSCOPY N/A 07/10/2015   Procedure: ESOPHAGOGASTRODUODENOSCOPY (EGD);  Surgeon: Scot Junobert T Elliott, MD;  Location: [facility];  Service: Endoscopy;  Laterality: N/A;  . ESOPHAGOGASTRODUODENOSCOPY N/A 01/18/2018   Procedure: ESOPHAGOGASTRODUODENOSCOPY (EGD);  Surgeon: Toledo, Boykin Nearingeodoro K, MD;  Location: [facility] ENDOSCOPY;  Service: Gastroenterology;  Laterality: N/A;    SOCIAL HISTORY:   Social  History   Tobacco Use  . Smoking status: Current Every Day Smoker    Packs/day: 1.00  . Smokeless tobacco: Never Used  Substance Use Topics  . Alcohol use: No    FAMILY HISTORY:   Family History  Problem Relation Age of Onset  . Diabetes Mother   . CAD Mother   . Bone cancer Brother   . Bronchitis Father     DRUG ALLERGIES:  No Known Allergies  REVIEW OF SYSTEMS:  Review of Systems  Constitutional: Negative for chills, fever and weight loss.  HENT: Negative for ear discharge, ear pain and nosebleeds.        Left eyelid swelling  Eyes: Negative for blurred vision, pain and discharge.  Respiratory: Positive for shortness of breath. Negative for sputum production, wheezing and stridor.   Cardiovascular: Positive for orthopnea, leg swelling and PND. Negative for chest pain and palpitations.  Gastrointestinal: Negative for abdominal pain, diarrhea, nausea and vomiting.  Genitourinary: Negative for frequency and urgency.  Musculoskeletal: Negative for back pain and joint pain.  Neurological: Negative for sensory change, speech change, focal weakness and weakness.  Psychiatric/Behavioral: Negative for depression and hallucinations. The patient is not nervous/anxious.      MEDICATIONS AT HOME:   Prior to Admission medications   Medication Sig Start Date End Date Taking? Authorizing Provider  amlodipine-atorvastatin (CADUET) 10-20 MG tablet Take 1 tablet by mouth daily.   Yes [provider]  ferrous sulfate 325 (65 FE) MG tablet Take 1 tablet (325 mg total) by mouth 2 (two) times daily with a meal. 07/22/15  Yes  Alford Highland, MD  Fluticasone-Salmeterol (ADVAIR DISKUS IN) Inhale 1 puff into the lungs 2 (two) times daily.   Yes [provider]  Fluticasone-Umeclidin-Vilant 100-62.5-25 MCG/INH AEPB Inhale 1 puff into the lungs daily. 12/13/17  Yes [provider]  GNP VITAMIN C 500 MG tablet Take 500 mg by mouth 2 (two) times daily. 09/22/17  Yes  [provider]  ipratropium-albuterol (DUONEB) 0.5-2.5 (3) MG/3ML SOLN Take 3 mLs by nebulization every 6 (six) hours as needed. 10/27/17  Yes Salary, Evelena Asa, MD  lovastatin (MEVACOR) 40 MG tablet Take 40 mg by mouth at bedtime. 09/23/17  Yes [provider]  metoprolol tartrate 37.5 MG TABS Take 37.5 mg by mouth 2 (two) times daily. 03/01/18  Yes Shaune Pollack, MD  montelukast (SINGULAIR) 10 MG tablet Take 10 mg by mouth daily. 10/22/17  Yes [provider]  pantoprazole (PROTONIX) 40 MG tablet Take 40 mg by mouth 2 (two) times daily. 12/08/17  Yes [provider]  predniSONE (DELTASONE) 20 MG tablet Take 2 tablets (40 mg total) by mouth daily with breakfast. 03/02/18  Yes Shaune Pollack, MD  tamsulosin (FLOMAX) 0.4 MG CAPS capsule Take 0.4 mg by mouth daily after breakfast.    Yes [provider]  DOK 100 MG capsule Take 100 mg by mouth 2 (two) times daily. 09/28/17   [provider]  PROAIR HFA 108 (90 Base) MCG/ACT inhaler Inhale 2 puffs into the lungs 4 (four) times daily as needed for wheezing or shortness of breath.  08/04/17   [provider]      VITAL SIGNS:  Blood pressure 132/64, pulse 77, temperature 99.2 F (37.3 C), temperature source Oral, resp. rate 15, height 5\' 8"  (1.727 m), weight 77.1 kg, SpO2 (!) 89 %.  PHYSICAL EXAMINATION:  GENERAL:  83 y.o.-year-old patient lying in the bed with no acute distress. He is chronically ill EYES:  Left eyelid swollen/puffy nontender. Left eye congested with chronic nonreactive people. Patient has complete loss of vision left eye chronic type you will normally reactive to light HEENT: Head atraumatic, normocephalic. Oropharynx and nasopharynx clear.  NECK:  Supple, no jugular venous distention. No thyroid enlargement, no tenderness.  LUNGS: coarse breath sounds bilaterally, no wheezing, rales,rhonchi or crepitation. No use of accessory muscles of respiration.  CARDIOVASCULAR: S1, S2 normal. No  murmurs, rubs, or gallops.  ABDOMEN: Soft, nontender, nondistended. Bowel sounds present. No organomegaly or mass.  EXTREMITIES: ++ pedal edema, cyanosis, or clubbing.  NEUROLOGIC: Cranial nerves II through XII are intact. Muscle strength 5/5 in all extremities. Sensation intact. Gait not checked.  PSYCHIATRIC: The patient is alert and oriented x 3.  SKIN: No obvious rash, lesion, or ulcer.   LABORATORY PANEL:   CBC Recent Labs  Lab 03/19/18 0850  WBC 19.6*  HGB 8.0*  HCT 25.6*  PLT 214   ------------------------------------------------------------------------------------------------------------------  Chemistries  Recent Labs  Lab 03/19/18 0850  NA 134*  K 4.1  CL 98  CO2 30  GLUCOSE 142*  BUN 11  CREATININE 0.85  CALCIUM 8.3*   ------------------------------------------------------------------------------------------------------------------  Cardiac Enzymes Recent Labs  Lab 03/19/18 0850  TROPONINI 0.10*   ------------------------------------------------------------------------------------------------------------------  RADIOLOGY:  Dg Chest 2 View  Result Date: 03/19/2018 CLINICAL DATA:  Shortness of breath. EXAM: CHEST - 2 VIEW COMPARISON:  02/28/2018 FINDINGS: Heart size appears normal. Aortic atherosclerosis. Bilateral pleural effusions are identified left greater than right. Mild diffuse pulmonary edema appears increased from the previous exam. IMPRESSION: 1. Bilateral pleural effusions, left greater than right.  2. Mild diffuse edema. Electronically Signed   By: Signa Kell M.D.   On: 03/19/2018 09:29    EKG:    IMPRESSION AND PLAN:   Douglas Mathis  is a 83 y.o. male with a known history of chronic respiratory failure/COPD on chronic 2.5 L nasal cannula oxygen, hypertension, BPH and glaucoma with chronic left eye loss of vision comes to the emergency room with increasing shortness of breath and leg swelling  1.increasing shortness of breath and leg  swelling with history of chronic respiratory failure secondary to severe emphysema on oxygen with chest x-ray consistent with diffuse pulmonary edema consistent with acute diastolic congestive heart failure new onset -admit to telemetry -IV Lasix 20 mg BID -continue metoprolol -monitor input output, metabolic panel -I will not repeat echo of the heart -consider cardiology consultation if needed. Patient sees [facility] as outpatient  2. Leukocytosis likely reactive -no fever. Patient has chronic cough. -Check CBC in the morning. If continues to rise consider further workup  3. Chronic respiratory failure due to severe emphysema on nasal cannula oxygen at home  -does not appear to be in COPD exacerbation -continue nebs and inhalers  4. Hypertension continue home meds  5. DVT prophylaxis subcu Lovenox  6. Left eyelid swelling-- patient is symptomatic -has chronic left I vision loss due to glaucoma -follows with [facility] -if worsens consider ophthalmology consultation  This was discussed with patient's daughters and their agreeable   All the records are reviewed and case discussed with ED provider.   CODE STATUS: DNR  TOTAL TIME TAKING CARE OF THIS PATIENT: *50* minutes.    Enedina Finner M.D on 03/19/2018 at 11:12 AM  Between 7am to 6pm - Pager - [phone]  After 6pm go to [email or web address] - password [email or web address] [facility]  [facility]  Office  [phone]  CC: Primary care physician; [facility]

## 2018-03-19 NOTE — ED Provider Notes (Signed)
[facility] Emergency Department Provider Note   ____________________________________________    I have reviewed the triage vital signs and the nursing notes.   HISTORY  Chief Complaint Anorexia; Shortness of Breath; and Facial Swelling     HPI Douglas Mathis is a 83 y.o. male with a history of COPD who presents with decreased p.o. appetite and worsening shortness of breath.  Denies chest pain.  States this feels different from his COPD.  Denies diaphoresis nausea or vomiting.  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  Denies significant cough or fevers or chills.  Past Medical History:  Diagnosis Date  . Anemia   . BPH (benign prostatic hyperplasia)   . COPD (chronic obstructive pulmonary disease) (HCC)   . Hypercholesterolemia   . Hypertension     Patient Active Problem List   Diagnosis Date Noted  . COPD (chronic obstructive pulmonary disease) (HCC) 02/26/2018  . Respiratory failure (HCC) 02/26/2018  . COPD exacerbation (HCC) 10/25/2017  . GI bleed 07/08/2015    Past Surgical History:  Procedure Laterality Date  . cataracts    . ESOPHAGOGASTRODUODENOSCOPY N/A 07/10/2015   Procedure: ESOPHAGOGASTRODUODENOSCOPY (EGD);  Surgeon: Scot Jun, MD;  Location: [facility] ENDOSCOPY;  Service: Endoscopy;  Laterality: N/A;  . ESOPHAGOGASTRODUODENOSCOPY N/A 01/18/2018   Procedure: ESOPHAGOGASTRODUODENOSCOPY (EGD);  Surgeon: Toledo, Boykin Nearing, MD;  Location: [facility] ENDOSCOPY;  Service: Gastroenterology;  Laterality: N/A;    Prior to Admission medications   Medication Sig Start Date End Date Taking? Authorizing Provider  amlodipine-atorvastatin (CADUET) 10-20 MG tablet Take 1 tablet by mouth daily.    [provider]  benzonatate (TESSALON) 100 MG capsule Take 1 capsule (100 mg total) by mouth 3 (three) times daily as needed for cough. 03/01/18   Shaune Pollack, MD  DOK 100 MG  capsule Take 100 mg by mouth 2 (two) times daily. 09/28/17   [provider]  ferrous sulfate 325 (65 FE) MG tablet Take 1 tablet (325 mg total) by mouth 2 (two) times daily with a meal. 07/22/15   Renae Gloss, Richard, MD  Fluticasone-Salmeterol (ADVAIR DISKUS IN) Inhale 1 puff into the lungs 2 (two) times daily.    [provider]  GNP VITAMIN C 500 MG tablet Take 500 mg by mouth 2 (two) times daily. 09/22/17   [provider]  ipratropium-albuterol (DUONEB) 0.5-2.5 (3) MG/3ML SOLN Take 3 mLs by nebulization every 6 (six) hours as needed. Patient not taking: Reported on 02/26/2018 10/27/17   Salary, Jetty Duhamel D, MD  lovastatin (MEVACOR) 40 MG tablet Take 40 mg by mouth at bedtime. 09/23/17   [provider]  metoprolol tartrate 37.5 MG TABS Take 37.5 mg by mouth 2 (two) times daily. 03/01/18   Shaune Pollack, MD  montelukast (SINGULAIR) 10 MG tablet Take 10 mg by mouth daily. 10/22/17   [provider]  nicotine (NICODERM CQ - DOSED IN MG/24 HOURS) 21 mg/24hr patch Place 1 patch (21 mg total) onto the skin daily. Patient not taking: Reported on 01/18/2018 10/28/17   Salary, Jetty Duhamel D, MD  oseltamivir (TAMIFLU) 75 MG capsule Take 1 capsule (75 mg total) by mouth 2 (two) times daily. 03/01/18   Shaune Pollack, MD  predniSONE (DELTASONE) 20 MG tablet Take 2 tablets (40 mg total) by mouth daily with breakfast. 03/02/18   Shaune Pollack, MD  [facility] HFA 108 [phone] Base) MCG/ACT inhaler Inhale 2 puffs into the lungs 4 (four) times daily  as needed for wheezing or shortness of breath.  08/04/17   [provider]  tamsulosin (FLOMAX) 0.4 MG CAPS capsule Take 0.4 mg by mouth daily after breakfast.     [provider]     Allergies Patient has no known allergies.  Family History  Problem Relation Age of Onset  . Diabetes Mother   . CAD Mother   . Bone cancer Brother   . Bronchitis Father     Social History Social History   Tobacco Use  . Smoking status: Current Every Day  Smoker    Packs/day: 1.00  . Smokeless tobacco: Never Used  Substance Use Topics  . Alcohol use: No  . Drug use: Never    Review of Systems  Constitutional: As above Eyes: Swelling as above ENT: No sore throat. Cardiovascular: As above Respiratory: As above Gastrointestinal: No abdominal pain.  No nausea, no vomiting.   Genitourinary: Negative for dysuria. Musculoskeletal: Negative for back pain. Skin: Negative for rash. Neurological: Negative for headaches    ____________________________________________   PHYSICAL EXAM:  VITAL SIGNS: ED Triage Vitals  Enc Vitals Group     BP 03/19/18 0838 (!) 145/57     Pulse Rate 03/19/18 0838 89     Resp 03/19/18 0838 (!) 26     Temp 03/19/18 0838 99.2 F (37.3 C)     Temp Source 03/19/18 0838 Oral     SpO2 03/19/18 0838 96 %     Weight 03/19/18 0836 77.1 kg (170 lb)     Height 03/19/18 0836 1.727 m (5\' 8" )     Head Circumference --      Peak Flow --      Pain Score 03/19/18 0836 0     Pain Loc --      Pain Edu? --      Excl. in GC? --     Constitutional: Alert and oriented.  Eyes: Conjunctivae are normal.  Left eyelid significant swelling, eyelid is swollen shut.  Chronic changes of glaucoma left eye Head: Atraumatic. Nose: No congestion/rhinnorhea. Mouth/Throat: Mucous membranes are moist.    Cardiovascular: Normal rate, regular rhythm. Grossly normal heart sounds.  Good peripheral circulation. Respiratory: Increased work of breathing with tachypnea, no retractions, scattered wheezes, bibasilar Rales Gastrointestinal: Soft and nontender. No distention.    Musculoskeletal: No lower extremity tenderness nor edema.  Warm and well perfused Neurologic:  Normal speech and language. No gross focal neurologic deficits are appreciated.  Skin:  Skin is warm, dry and intact. No rash noted. Psychiatric: Mood and affect are normal. Speech and behavior are normal.  ____________________________________________   LABS (all labs  ordered are listed, but only abnormal results are displayed)  Labs Reviewed  BASIC METABOLIC PANEL - Abnormal; Notable for the following components:      Result Value   Sodium 134 (*)    Glucose, Bld 142 (*)    Calcium 8.3 (*)    All other components within normal limits  CBC - Abnormal; Notable for the following components:   WBC 19.6 (*)    RBC 3.25 (*)    Hemoglobin 8.0 (*)    HCT 25.6 (*)    MCV 78.8 (*)    MCH 24.6 (*)    RDW 21.3 (*)    All other components within normal limits  TROPONIN I - Abnormal; Notable for the following components:   Troponin I 0.10 (*)    All other components within normal limits  URINALYSIS, COMPLETE (UACMP) WITH MICROSCOPIC  ____________________________________________  EKG  ED ECG REPORT I, Jene Every, the attending physician, personally viewed and interpreted this ECG.  Date: 03/19/2018  Rhythm: normal sinus rhythm QRS Axis: normal Intervals: Right bundle branch block ST/T Wave abnormalities: Nonspecific change   ____________________________________________  RADIOLOGY  Chest x-ray demonstrates bilateral pleural effusions, pulmonary edema ____________________________________________   PROCEDURES  Procedure(s) performed: No  Procedures   Critical Care performed: yes  CRITICAL CARE Performed by: Jene Every   Total critical care time: 30 minutes  Critical care time was exclusive of separately billable procedures and treating other patients.  Critical care was necessary to treat or prevent imminent or life-threatening deterioration.  Critical care was time spent personally by me on the following activities: development of treatment plan with patient and/or surrogate as well as nursing, discussions with consultants, evaluation of patient's response to treatment, examination of patient, obtaining history from patient or surrogate, ordering and performing treatments and interventions, ordering and review of laboratory  studies, ordering and review of radiographic studies, pulse oximetry and re-evaluation of patient's condition.  ____________________________________________   INITIAL IMPRESSION / ASSESSMENT AND PLAN / ED COURSE  Pertinent labs & imaging results that were available during my care of the patient were reviewed by me and considered in my medical decision making (see chart for details).  Patient presents with weakness, decreased appetite, increased shortness of breath.  Chest x-ray demonstrates pulmonary edema, lab work significant for elevated white blood cell count.  His troponin is elevated at 0.10 likely related to pulmonary edema.  No evidence of pneumonia on chest x-ray, no significant cough.  We will give IV Lasix and admit to the hospital service for further diuresis and management    ____________________________________________   FINAL CLINICAL IMPRESSION(S) / ED DIAGNOSES  Final diagnoses:  Acute pulmonary edema (HCC)  COPD exacerbation (HCC)  Elevated troponin        Note:  This document was prepared using Dragon voice recognition software and may include unintentional dictation errors.   Jene Every, MD 03/19/18 1002

## 2018-03-19 NOTE — ED Triage Notes (Signed)
Pt arrives via ems from home with concerns over poor appetite for the last 4 days. Pt states he woke up this morning with his left eye swollen but no known trauma or irritation. Pt states he wears 4L nasal canula as needed and arrives with the oxygen on. Pt states one hour prior to arrival pt felt short of breath and applied his prn supplemental oxygen.

## 2018-03-19 NOTE — ED Notes (Signed)
Family at bedside. 

## 2018-03-19 NOTE — Progress Notes (Signed)
Family Meeting Note  Advance Directive:no   Today a meeting took place with the pt and 2 dters patient presents with increasing shortness of breath and leg swelling. Workup in the ER showed pulmonary edema on chest x-ray. Patient has chronic respiratory failure on 2 1/2 L of nasal cannula oxygen. He received IV Lasix. Discuss code status with patient and two daughters. Patient wishes to be DNR. Out of facility DNR form filled. Time spent during discussion 16 minutes Enedina Finner, MD

## 2018-03-19 NOTE — ED Notes (Addendum)
Hospitalist at bedside - Dr. Allena Katz.

## 2018-03-19 NOTE — ED Notes (Signed)
Pharmacy tech in room to reconcile meds.

## 2018-03-19 NOTE — Progress Notes (Addendum)
Notify Dr. Allena Katz about 7 beats of vtach, potassium is 4.1. patient asymptomatic. RN will continue to monitor.

## 2018-03-19 NOTE — ED Notes (Signed)
ED TO INPATIENT HANDOFF REPORT  Name/Age/Gender Douglas Mathis 83 y.o. male  Code Status Code Status History    Date Active Date Inactive Code Status Order ID Comments User Context   02/27/2018 0706 03/01/2018 1607 Partial Code [id number]  Bertrum SolSalary, Montell D, MD ED   10/25/2017 1225 10/27/2017 1929 DNR [id number]  Alford HighlandWieting, Richard, MD ED   07/08/2015 2030 07/11/2015 1358 DNR [id number]  Altamese DillingVachhani, Vaibhavkumar, MD Inpatient    Questions for Most Recent Historical Code Status (Order [id number])    Question Answer Comment   In the event of cardiac or respiratory ARREST: Initiate Code Blue, Call Rapid Response Yes    In the event of cardiac or respiratory ARREST: Perform CPR Yes    In the event of cardiac or respiratory ARREST: Perform Intubation/Mechanical Ventilation No    In the event of cardiac or respiratory ARREST: Use NIPPV/BiPAp only if indicated Yes    In the event of cardiac or respiratory ARREST: Administer ACLS medications if indicated Yes    In the event of cardiac or respiratory ARREST: Perform Defibrillation or Cardioversion if indicated Yes    Comments nurse may pronounce       Home/SNF/Other Home  Chief Complaint loss of appit  Level of Care/Admitting Diagnosis ED Disposition    ED Disposition Condition Comment   Admit  Hospital Area: [facility] [[id number]]  Level of Care: Telemetry [5]  Diagnosis: Pulmonary edema with congestive heart failure [facility]HCC) [[id number]]) [[id number]]  Admitting Physician: Joselyn GlassmanPATEL, SONA [[id number]]  Attending Physician: Joselyn GlassmanPATEL, SONA [[id number]]  Estimated length of stay: past midnight tomorrow  Certification:: I certify this patient will need inpatient services for at least 2 midnights  PT Class (Do Not Modify): Inpatient [101]  PT Acc Code (Do Not Modify): Private [1]       Medical History Past Medical History:  Diagnosis Date  . Anemia   . BPH (benign prostatic hyperplasia)   . COPD (chronic obstructive pulmonary disease) (HCC)   .  Hypercholesterolemia   . Hypertension     Allergies No Known Allergies  IV Location/Drains/Wounds Patient Lines/Drains/Airways Status   Active Line/Drains/Airways    Name:   Placement date:   Placement time:   Site:   Days:   Peripheral IV 03/19/18 Right Antecubital   03/19/18    0846    Antecubital   less than 1          Labs/Imaging Results for orders placed or performed during the hospital encounter of 03/19/18 (from the past 48 hour(s))  Basic metabolic panel     Status: Abnormal   Collection Time: 03/19/18  8:50 AM  Result Value Ref Range   Sodium 134 (L) 135 - 145 mmol/L   Potassium 4.1 3.5 - 5.1 mmol/L    Comment: HEMOLYSIS AT THIS LEVEL MAY AFFECT RESULT   Chloride 98 98 - 111 mmol/L   CO2 30 22 - 32 mmol/L   Glucose, Bld 142 (H) 70 - 99 mg/dL   BUN 11 8 - 23 mg/dL   Creatinine, Ser 2.720.85 0.61 - 1.24 mg/dL   Calcium 8.3 (L) 8.9 - 10.3 mg/dL   GFR calc non Af Amer >60 >60 mL/min   GFR calc Af Amer >60 >60 mL/min   Anion gap 6 5 - 15    Comment: Performed at [facility], [address] [place], [place] [address]  CBC     Status: Abnormal   Collection Time: 03/19/18  8:50 AM  Result Value Ref Range  WBC 19.6 (H) 4.0 - 10.5 K/uL   RBC 3.25 (L) 4.22 - 5.81 MIL/uL   Hemoglobin 8.0 (L) 13.0 - 17.0 g/dL   HCT 16.1 (L) 09.6 - 04.5 %   MCV 78.8 (L) 80.0 - 100.0 fL   MCH 24.6 (L) 26.0 - 34.0 pg   MCHC 31.3 30.0 - 36.0 g/dL   RDW 40.9 (H) 81.1 - 91.4 %   Platelets 214 150 - 400 K/uL   nRBC 0.0 0.0 - 0.2 %    Comment: Performed at [facility] [address] [place], [place] [address]  Troponin I - Add-On to previous collection     Status: Abnormal   Collection Time: 03/19/18  8:50 AM  Result Value Ref Range   Troponin I 0.10 (HH) <0.03 ng/mL    Comment: CRITICAL RESULT CALLED TO, READ BACK BY AND VERIFIED WITH Jamyra Zweig ROSS AT 6213 03/19/2018.PMF Performed at [facility], [address] [place], [place] [address]    Dg Chest 2  View  Result Date: 03/19/2018 CLINICAL DATA:  Shortness of breath. EXAM: CHEST - 2 VIEW COMPARISON:  02/28/2018 FINDINGS: Heart size appears normal. Aortic atherosclerosis. Bilateral pleural effusions are identified left greater than right. Mild diffuse pulmonary edema appears increased from the previous exam. IMPRESSION: 1. Bilateral pleural effusions, left greater than right. 2. Mild diffuse edema. Electronically Signed   By: Signa Kell M.D.   On: 03/19/2018 09:29    Pending Labs Unresulted Labs (From admission, onward)    Start     Ordered   03/20/18 0500  CBC  Tomorrow morning,   STAT     03/19/18 1053   03/19/18 1034  Brain natriuretic peptide  Add-on,   AD     03/19/18 1033   03/19/18 0846  Urinalysis, Complete w Microscopic  ONCE - STAT,   STAT     03/19/18 0845   Signed and Held  CBC  (enoxaparin (LOVENOX)    CrCl >/= 30 ml/min)  Once,   R    Comments:  Baseline for enoxaparin therapy IF NOT ALREADY DRAWN.  Notify MD if PLT < 100 K.    Signed and Held   Signed and Held  Creatinine, serum  (enoxaparin (LOVENOX)    CrCl >/= 30 ml/min)  Once,   R    Comments:  Baseline for enoxaparin therapy IF NOT ALREADY DRAWN.    Signed and Held   Signed and Held  Creatinine, serum  (enoxaparin (LOVENOX)    CrCl >/= 30 ml/min)  Weekly,   R    Comments:  while on enoxaparin therapy    Signed and Held   Signed and Held  Basic metabolic panel  Tomorrow morning,   R     Signed and Held          Vitals/Pain Today's Vitals   03/19/18 0930 03/19/18 1000 03/19/18 1030 03/19/18 1100  BP: (!) 132/57 132/60 129/62 132/64  Pulse: 77 80 77   Resp: (!) 27 (!) 27 (!) 24 15  Temp:      TempSrc:      SpO2: 97% 91% 99%   Weight:      Height:      PainSc:        Isolation Precautions No active isolations  Medications Medications  sodium chloride flush (NS) 0.9 % injection 3 mL (3 mLs Intravenous Given 03/19/18 0853)  furosemide (LASIX) injection 40 mg (40 mg Intravenous Given 03/19/18  1056)  ipratropium-albuterol (DUONEB) 0.5-2.5 (3)  MG/3ML nebulizer solution 3 mL (3 mLs Nebulization Given 03/19/18 1054)    Mobility walks with person assist

## 2018-03-19 NOTE — Care Management Note (Signed)
Case Management Note  Patient Details  Name: Douglas Mathis MRN: [id number] Date of Birth: 1936/01/18  Subjective/Objective:    Patient is from home alone.  Admitted with pulmonary edema.  Hx COPD.  Chronic oxygen at 2.5L  home.  He has a daughter that lives close by and assists with transportation.  He is current with [facility].  Denies difficulties obtaining medications.    Action/Plan: CXR reveals new chf.  ECHO done in September 2019 EF 60-65%.  Patient has scales.  Heart failure booklet provided.  Added daily weights, I&O and dietary consult.  Patient is interested in [facility] services.  Referral made to [facility] for [facility] vest, SN, PT. Rosey Bath accepted.  Will notify her when patient discharges.  Asked RN to start CHF education videos.  Will continue to follow as patient progresses and assist with discharge planning.       Expected Discharge Date:  03/21/18               Expected Discharge Plan:  Home w Home Health Services  In-House Referral:     Discharge planning Services  CM Consult, HF Clinic  Post Acute Care Choice:  Home Health Choice offered to:     DME Arranged:    DME Agency:     HH Arranged:  RN, PT, Nurse's Aide, Social Work [employer] Agency:     Status of Service:  In process, will continue to follow  If discussed at Long Length of Stay Meetings, dates discussed:    Additional Comments:  Sherren Kerns, RN 03/19/2018, 2:58 PM

## 2018-03-20 LAB — TROPONIN I
TROPONIN I: 0.04 ng/mL — AB (ref <0.03)
Troponin I: 0.05 ng/mL (ref <0.03)
Troponin I: 0.04 ng/mL (ref <0.03)

## 2018-03-20 LAB — CBC
HCT: 27.2 % — ABNORMAL LOW (ref 39.0–52.0)
Hemoglobin: 8.6 g/dL — ABNORMAL LOW (ref 13.0–17.0)
MCH: 24.7 pg — ABNORMAL LOW (ref 26.0–34.0)
MCHC: 31.6 g/dL (ref 30.0–36.0)
MCV: 78.2 fL — ABNORMAL LOW (ref 80.0–100.0)
NRBC: 0 % (ref 0.0–0.2)
Platelets: 223 10*3/uL (ref 150–400)
RBC: 3.48 MIL/uL — ABNORMAL LOW (ref 4.22–5.81)
RDW: 21.2 % — ABNORMAL HIGH (ref 11.5–15.5)
WBC: 19.5 10*3/uL — ABNORMAL HIGH (ref 4.0–10.5)

## 2018-03-20 LAB — BASIC METABOLIC PANEL
Anion gap: 9 (ref 5–15)
BUN: 14 mg/dL (ref 8–23)
CO2: 31 mmol/L (ref 22–32)
Calcium: 8 mg/dL — ABNORMAL LOW (ref 8.9–10.3)
Chloride: 94 mmol/L — ABNORMAL LOW (ref 98–111)
Creatinine, Ser: 0.68 mg/dL (ref 0.61–1.24)
GFR calc Af Amer: >60 mL/min (ref >60)
GFR calc non Af Amer: >60 mL/min (ref >60)
Glucose, Bld: 167 mg/dL — ABNORMAL HIGH (ref 70–99)
Potassium: 3.7 mmol/L (ref 3.5–5.1)
Sodium: 134 mmol/L — ABNORMAL LOW (ref 135–145)

## 2018-03-20 MED ORDER — SODIUM CHLORIDE 0.9% FLUSH
3.0000 mL | Freq: Two times a day (BID) | INTRAVENOUS | Status: DC
  Administered 2018-03-20 – 2018-03-22 (×4): 3 mL via INTRAVENOUS

## 2018-03-20 MED ORDER — FUROSEMIDE 10 MG/ML IJ SOLN
40.0000 mg | Freq: Two times a day (BID) | INTRAMUSCULAR | Status: DC
  Administered 2018-03-20 – 2018-03-22 (×4): 40 mg via INTRAVENOUS
  Filled 2018-03-20 (×4): qty 4

## 2018-03-20 MED ORDER — AMOXICILLIN-POT CLAVULANATE 875-125 MG PO TABS
1.0000 | ORAL_TABLET | Freq: Two times a day (BID) | ORAL | Status: DC
  Administered 2018-03-20 – 2018-03-22 (×5): 1 via ORAL
  Filled 2018-03-20 (×5): qty 1

## 2018-03-20 NOTE — Plan of Care (Signed)
  Problem: Education: Goal: Knowledge of General Education information will improve Description Including pain rating scale, medication(s)/side effects and non-pharmacologic comfort measures Outcome: Progressing   Problem: Clinical Measurements: Goal: Respiratory complications will improve Outcome: Progressing   Problem: Education: Goal: Ability to verbalize understanding of medication therapies will improve Outcome: Progressing   Problem: Cardiac: Goal: Ability to achieve and maintain adequate cardiopulmonary perfusion will improve Outcome: Progressing

## 2018-03-20 NOTE — Progress Notes (Addendum)
[facility] - [place] at [facility]   PATIENT NAME: Douglas Mathis    MR#:  [id number]  DATE OF BIRTH:  1935-10-10  SUBJECTIVE:  CHIEF COMPLAINT:   Chief Complaint  Patient presents with  . Anorexia  . Shortness of Breath  . Facial Swelling  Patient seen and evaluated today Shortness of breath present Oxygen via nasal cannula had to be increased to 4 L Occasional cough Has orthopnea No complaints of chest pain  REVIEW OF SYSTEMS:    ROS  CONSTITUTIONAL: No documented fever. No fatigue, weakness. No weight gain, no weight loss.  EYES: No blurry or double vision.  ENT: No tinnitus. No postnasal drip. No redness of the oropharynx.  RESPIRATORY: occasional cough, no wheeze, no hemoptysis. Has dyspnea.  CARDIOVASCULAR: No chest pain. Has orthopnea. No palpitations. No syncope.  GASTROINTESTINAL: No nausea, no vomiting or diarrhea. No abdominal pain. No melena or hematochezia.  GENITOURINARY: No dysuria or hematuria.  ENDOCRINE: No polyuria or nocturia. No heat or cold intolerance.  HEMATOLOGY: No anemia. No bruising. No bleeding.  INTEGUMENTARY: No rashes. No lesions.  MUSCULOSKELETAL: No arthritis. No swelling. No gout.  NEUROLOGIC: No numbness, tingling, or ataxia. No seizure-type activity.  PSYCHIATRIC: No anxiety. No insomnia. No ADD.   DRUG ALLERGIES:  No Known Allergies  VITALS:  Blood pressure (!) 136/59, pulse 80, temperature 99.2 F (37.3 C), temperature source Oral, resp. rate 20, height 5\' 8"  (1.727 m), weight 78.3 kg, SpO2 91 %.  PHYSICAL EXAMINATION:   Physical Exam  GENERAL:  83 y.o.-year-old patient lying in the bed with no acute distress.  EYES: Pupils equal, round, reactive to light and accommodation. No scleral icterus. Extraocular muscles intact.  HEENT: Head atraumatic, normocephalic. Oropharynx and nasopharynx clear.  NECK:  Supple, no jugular venous distention. No thyroid enlargement, no tenderness.  LUNGS: Decreased breath sounds  bilaterally, bibasilar crepitations heard. No use of accessory muscles of respiration.  CARDIOVASCULAR: S1, S2 normal. No murmurs, rubs, or gallops.  ABDOMEN: Soft, nontender, nondistended. Bowel sounds present. No organomegaly or mass.  EXTREMITIES: No cyanosis, clubbing  has edema b/l.    NEUROLOGIC: Cranial nerves II through XII are intact. No focal Motor or sensory deficits b/l.   PSYCHIATRIC: The patient is alert and oriented x 3.  SKIN: No obvious rash, lesion, or ulcer.   LABORATORY PANEL:   CBC Recent Labs  Lab 03/20/18 0302  WBC 19.5*  HGB 8.6*  HCT 27.2*  PLT 223   ------------------------------------------------------------------------------------------------------------------ Chemistries  Recent Labs  Lab 03/20/18 0302  NA 134*  K 3.7  CL 94*  CO2 31  GLUCOSE 167*  BUN 14  CREATININE 0.68  CALCIUM 8.0*   ------------------------------------------------------------------------------------------------------------------  Cardiac Enzymes Recent Labs  Lab 03/19/18 0850  TROPONINI 0.10*   ------------------------------------------------------------------------------------------------------------------  RADIOLOGY:  Dg Chest 2 View  Result Date: 03/19/2018 CLINICAL DATA:  Shortness of breath. EXAM: CHEST - 2 VIEW COMPARISON:  02/28/2018 FINDINGS: Heart size appears normal. Aortic atherosclerosis. Bilateral pleural effusions are identified left greater than right. Mild diffuse pulmonary edema appears increased from the previous exam. IMPRESSION: 1. Bilateral pleural effusions, left greater than right. 2. Mild diffuse edema. Electronically Signed   By: Signa Kell M.D.   On: 03/19/2018 09:29     ASSESSMENT AND PLAN:   83 year old elderly male patient with history of COPD on oxygen at home at 2.5 L via nasal cannula, chronic respiratory failure, benign prostate hypertrophy, glaucoma, chronic left eye vision loss, hyperlipidemia, hypertension currently under  hospitalist service  for shortness of breath  -Acute diastolic heart failure exacerbation Continue diuresis with Lasix Will increase dose of Lasix to 40 mg IV twice daily Continue oral metoprolol Input output chart and daily body weights Patient had a recent echocardiogram reviewed report Follows up with [facility] clinic cardiology as outpatient will follow-up if needed Echocardiogram 10/25/2017 Left ventricle: Wall thickness was increased in a pattern of   moderate LVH. Systolic function was normal. The estimated   ejection fraction was in the range of 60% to 65%. - Right ventricle: The cavity size was mildly dilated. - Right atrium: The atrium was mildly dilated. - Atrial septum: No defect or patent foramen ovale was identified. - Tricuspid valve: There was moderate regurgitation.  -Elevated troponin secondary to demand ischemia from heart failure Cycle troponin  -Leukocytosis Has bronchitis Start oral Augmentin antibiotic  -Chronic respiratory failure with emphysema Continue oxygen via nasal cannula Continue nebulization treatments and inhalers  -DVT prophylaxis subcu Lovenox daily  All the records are reviewed and case discussed with Care Management/Social Worker. Management plans discussed with the patient, family and they are in agreement.  CODE STATUS: DNR  DVT Prophylaxis: SCDs  TOTAL TIME TAKING CARE OF THIS PATIENT: 35 minutes.   POSSIBLE D/C IN 2 to 3 DAYS, DEPENDING ON CLINICAL CONDITION.  Ihor Austin M.D on 03/20/2018 at 9:39 AM  Between 7am to 6pm - Pager - [phone]  After 6pm go to [email or web address] - password [email or web address] [facility]  [facility] [place] [facility]  Office  [phone]  CC: Primary care physician; Center, [facility]  Note: This dictation was prepared with Nurse, children's dictation along with smaller phrase technology. Any transcriptional errors that result from this process are unintentional.

## 2018-03-21 LAB — BASIC METABOLIC PANEL
Anion gap: 9 (ref 5–15)
BUN: 11 mg/dL (ref 8–23)
CALCIUM: 8.1 mg/dL — AB (ref 8.9–10.3)
CO2: 31 mmol/L (ref 22–32)
Chloride: 90 mmol/L — ABNORMAL LOW (ref 98–111)
Creatinine, Ser: 0.67 mg/dL (ref 0.61–1.24)
GFR calc Af Amer: >60 mL/min (ref >60)
GFR calc non Af Amer: >60 mL/min (ref >60)
Glucose, Bld: 124 mg/dL — ABNORMAL HIGH (ref 70–99)
Potassium: 3.7 mmol/L (ref 3.5–5.1)
Sodium: 130 mmol/L — ABNORMAL LOW (ref 135–145)

## 2018-03-21 LAB — CBC
HCT: 26.6 % — ABNORMAL LOW (ref 39.0–52.0)
Hemoglobin: 8.6 g/dL — ABNORMAL LOW (ref 13.0–17.0)
MCH: 25.1 pg — ABNORMAL LOW (ref 26.0–34.0)
MCHC: 32.3 g/dL (ref 30.0–36.0)
MCV: 77.8 fL — ABNORMAL LOW (ref 80.0–100.0)
Platelets: 248 10*3/uL (ref 150–400)
RBC: 3.42 MIL/uL — ABNORMAL LOW (ref 4.22–5.81)
RDW: 20.3 % — ABNORMAL HIGH (ref 11.5–15.5)
WBC: 17.7 10*3/uL — ABNORMAL HIGH (ref 4.0–10.5)
nRBC: 0 % (ref 0.0–0.2)

## 2018-03-21 MED ORDER — POTASSIUM CHLORIDE CRYS ER 20 MEQ PO TBCR
40.0000 meq | EXTENDED_RELEASE_TABLET | Freq: Every day | ORAL | Status: DC
  Administered 2018-03-21 – 2018-03-22 (×2): 40 meq via ORAL
  Filled 2018-03-21 (×2): qty 2

## 2018-03-21 MED ORDER — ENSURE ENLIVE PO LIQD
237.0000 mL | Freq: Two times a day (BID) | ORAL | Status: DC
  Administered 2018-03-21 – 2018-03-22 (×2): 237 mL via ORAL

## 2018-03-21 MED ORDER — ADULT MULTIVITAMIN W/MINERALS CH
1.0000 | ORAL_TABLET | Freq: Every day | ORAL | Status: DC
  Administered 2018-03-21: 1 via ORAL
  Filled 2018-03-21: qty 1

## 2018-03-21 NOTE — Progress Notes (Signed)
Initial Nutrition Assessment  DOCUMENTATION CODES:   Not applicable  INTERVENTION:   Ensure Enlive po BID, each supplement provides 350 kcal and 20 grams of protein  MVI daily   2 gram sodium diet   NUTRITION DIAGNOSIS:   Inadequate oral intake related to acute illness as evidenced by meal completion < 25%.  GOAL:   Patient will meet greater than or equal to 90% of their needs  MONITOR:   PO intake, Supplement acceptance, Labs, Weight trends, Skin, I & O's  REASON FOR ASSESSMENT:   Consult Diet education  ASSESSMENT:   83 year old elderly male patient with history of COPD on oxygen at home at 2.5 L via nasal cannula, chronic respiratory failure, benign prostate hypertrophy, glaucoma, chronic left eye vision loss, hyperlipidemia, hypertension currently under hospitalist service for shortness of breath   Met with pt in room today. Pt is a poor historian but reports good appetite and oral intake at baseline. Pt reports eating 75% of some grits and oatmeal for breakfast this morning. Pt documented to be eating 10-40% of meals since admit. Pt reports he does enjoy drinking chocolate Ensure but he does not drink this regularly. Per chart, pt with 9lb(5%) wt loss over the past 4 months; this is not significant given the time frame. RD will add supplements and MVI to help pt meet his estimated needs. Of note, pt with iron deficiency anemia; pt is being followed outpatient for this. Pt provided with basic low sodium diet education today and was provided handouts with supporting information.       Medications reviewed and include: augmentin, lovenox, ferrous sulfate, lasix, protonix, senokot, vitamin C  Labs reviewed: Na 130(L), Cl 90(L) Iron 19(L), TIBC 320, ferritin 6(L) - 9/2 B12 657- 9/3 Wbc- 17.7(H), Hgb 8.6(L), Hct 26.6(L), MCV 77.8(L), MCH 25.1(L)  NUTRITION - FOCUSED PHYSICAL EXAM:    Most Recent Value  Orbital Region  No depletion  Upper Arm Region  No depletion   Thoracic and Lumbar Region  No depletion  Buccal Region  No depletion  Temple Region  Mild depletion  Clavicle Bone Region  No depletion  Clavicle and Acromion Bone Region  No depletion  Scapular Bone Region  No depletion  Dorsal Hand  No depletion  Patellar Region  No depletion  Anterior Thigh Region  Mild depletion  Posterior Calf Region  Mild depletion  Edema (RD Assessment)  None  Hair  Reviewed  Eyes  Reviewed  Mouth  Reviewed  Skin  Reviewed  Nails  Reviewed     Diet Order:   Diet Order            Diet Heart Room service appropriate? Yes; Fluid consistency: Thin  Diet effective now             EDUCATION NEEDS:   Education needs have been addressed  Skin:  Skin Assessment: Reviewed RN Assessment  Last BM:  1/26- type 5  Height:   Ht Readings from Last 1 Encounters:  03/19/18 _0  (1.727 m)    Weight:   Wt Readings from Last 1 Encounters:  03/21/18 77.3 kg    Ideal Body Weight:  70 kg  BMI:  Body mass index is 25.91 kg/m.  Estimated Nutritional Needs:   Kcal:  1900-2200kcal/day   Protein:  78-94g/day   Fluid:  >1.8L/day   Koleen Distance MS, RD, LDN Pager #- [phone] Office#- [phone] After Hours Pager: [phone]

## 2018-03-21 NOTE — Progress Notes (Signed)
Pt ambulated approximately 150 ft oxygen saturations 92% on his baseline 2.5 L [place] at rest, with ambulation patient oxygen saturations drop to 85% on 2.5 L [place]. Had to increase oxygen to 6 Liters with ambulation in order to maintain Oxygen saturation greater then 88% Patient able to maintain oxygen level of 92% after recovery on 2.5 L [place].

## 2018-03-21 NOTE — Plan of Care (Signed)
Nutrition Education Note  RD consulted for nutrition education regarding CHF.  RD provided "Low Sodium Nutrition Therapy" handout from the Academy of [employer] and Dietetics. Reviewed patient's dietary recall. Provided examples on ways to decrease sodium intake in diet. Discouraged intake of processed foods and use of salt shaker. Encouraged fresh fruits and vegetables as well as whole grain sources of carbohydrates to maximize fiber intake.   RD discussed why it is important for patient to adhere to diet recommendations, and emphasized the role of fluids, foods to avoid, and importance of weighing self daily. Teach back method used.  Expect poor compliance.  Body mass index is 25.91 kg/m. Pt meets criteria for normal weight for age based on current BMI.  RD following this pt    Betsey Holiday MS, RD, LDN Pager #[phone] Office#- [phone] After Hours Pager: [phone]'

## 2018-03-21 NOTE — Progress Notes (Signed)
[facility] - [place] at [facility]   PATIENT NAME: Douglas Mathis    MR#:  [id number]  DATE OF BIRTH:  1935/12/15  SUBJECTIVE:  CHIEF COMPLAINT:   Chief Complaint  Patient presents with  . Anorexia  . Shortness of Breath  . Facial Swelling  Patient seen and evaluated today Shortness of breath present Had low-grade fever this morning Oxygen via nasal cannula around 3 L And patient ambulated oxygen saturation dropped to 85%.  Needs oxygen by nasal cannula had to be bumped to 6 L Occasional cough still present Has orthopnea No complaints of chest pain  REVIEW OF SYSTEMS:    ROS  CONSTITUTIONAL: No documented fever. No fatigue, weakness. No weight gain, no weight loss.  EYES: No blurry or double vision.  ENT: No tinnitus. No postnasal drip. No redness of the oropharynx.  RESPIRATORY: occasional cough, no wheeze, no hemoptysis. Has dyspnea.  CARDIOVASCULAR: No chest pain. Has orthopnea. No palpitations. No syncope.  GASTROINTESTINAL: No nausea, no vomiting or diarrhea. No abdominal pain. No melena or hematochezia.  GENITOURINARY: No dysuria or hematuria.  ENDOCRINE: No polyuria or nocturia. No heat or cold intolerance.  HEMATOLOGY: No anemia. No bruising. No bleeding.  INTEGUMENTARY: No rashes. No lesions.  MUSCULOSKELETAL: No arthritis. No swelling. No gout.  NEUROLOGIC: No numbness, tingling, or ataxia. No seizure-type activity.  PSYCHIATRIC: No anxiety. No insomnia. No ADD.   DRUG ALLERGIES:  No Known Allergies  VITALS:  Blood pressure (!) 146/82, pulse 72, temperature (!) 100.7 F (38.2 C), temperature source Oral, resp. rate 16, height 5\' 8"  (1.727 m), weight 77.3 kg, SpO2 94 %.  PHYSICAL EXAMINATION:   Physical Exam  GENERAL:  84 y.o.-year-old patient lying in the bed with no acute distress.  EYES: Pupils equal, round, reactive to light and accommodation. No scleral icterus. Extraocular muscles intact.  HEENT: Head atraumatic, normocephalic.  Oropharynx and nasopharynx clear.  NECK:  Supple, no jugular venous distention. No thyroid enlargement, no tenderness.  LUNGS: Decreased breath sounds bilaterally, bibasilar crepitations heard. No use of accessory muscles of respiration.  CARDIOVASCULAR: S1, S2 normal. No murmurs, rubs, or gallops.  ABDOMEN: Soft, nontender, nondistended. Bowel sounds present. No organomegaly or mass.  EXTREMITIES: No cyanosis, clubbing  has edema b/l.    NEUROLOGIC: Cranial nerves II through XII are intact. No focal Motor or sensory deficits b/l.   PSYCHIATRIC: The patient is alert and oriented x 3.  SKIN: No obvious rash, lesion, or ulcer.   LABORATORY PANEL:   CBC Recent Labs  Lab 03/21/18 0436  WBC 17.7*  HGB 8.6*  HCT 26.6*  PLT 248   ------------------------------------------------------------------------------------------------------------------ Chemistries  Recent Labs  Lab 03/21/18 0436  NA 130*  K 3.7  CL 90*  CO2 31  GLUCOSE 124*  BUN 11  CREATININE 0.67  CALCIUM 8.1*   ------------------------------------------------------------------------------------------------------------------  Cardiac Enzymes Recent Labs  Lab 03/20/18 2147  TROPONINI 0.04*   ------------------------------------------------------------------------------------------------------------------  RADIOLOGY:  No results found.   ASSESSMENT AND PLAN:   83 year old elderly male patient with history of COPD on oxygen at home at 2.5 L via nasal cannula, chronic respiratory failure, benign prostate hypertrophy, glaucoma, chronic left eye vision loss, hyperlipidemia, hypertension currently under hospitalist service for shortness of breath  -Acute diastolic heart failure exacerbation resolving slowly Continue diuresis with Lasix Continue diuresis with Lasix to 40 mg IV twice daily Continue oral metoprolol Input output chart and daily body weights Patient had a recent echocardiogram reviewed report Follows  up with [facility] cardi[facility]  as outpatient will follow-up if needed Echocardiogram 10/25/2017 Left ventricle: Wall thickness was increased in a pattern of   moderate LVH. Systolic function was normal. The estimated   ejection fraction was in the range of 60% to 65%. - Right ventricle: The cavity size was mildly dilated. - Right atrium: The atrium was mildly dilated. - Atrial septum: No defect or patent foramen ovale was identified. - Tricuspid valve: There was moderate regurgitation.  -Elevated troponin secondary to demand ischemia from heart failure Cycle troponin  -Leukocytosis improving Has bronchitis Low-grade fever possibly from bronchitis Continue oral Augmentin antibiotic  -Chronic respiratory failure with emphysema Continue oxygen via nasal cannula Continue nebulization treatments and inhalers  -DVT prophylaxis subcu Lovenox daily  -Ambulatory dysfunction Physical therapy for endurance training  All the records are reviewed and case discussed with Care Management/Social Worker. Management plans discussed with the patient, family and they are in agreement.  CODE STATUS: DNR  DVT Prophylaxis: SCDs  TOTAL TIME TAKING CARE OF THIS PATIENT: 36 minutes.   POSSIBLE D/C IN 2 to 3 DAYS, DEPENDING ON CLINICAL CONDITION.  Ihor Austin M.D on 03/21/2018 at 12:03 PM  Between 7am to 6pm - Pager - [phone]  After 6pm go to [email or web address] - password [email or web address] [facility]  [facility] [place] [facility]  Office  [phone]  CC: Primary care physician; [facility], [facility]  Note: This dictation was prepared with Nurse, children's dictation along with smaller phrase technology. Any transcriptional errors that result from this process are unintentional.

## 2018-03-21 NOTE — Care Management (Signed)
Faxed signed ReDS Vest Protocol to [facility].

## 2018-03-22 LAB — BASIC METABOLIC PANEL
Anion gap: 10 (ref 5–15)
BUN: 11 mg/dL (ref 8–23)
CO2: 30 mmol/L (ref 22–32)
Calcium: 8.1 mg/dL — ABNORMAL LOW (ref 8.9–10.3)
Chloride: 86 mmol/L — ABNORMAL LOW (ref 98–111)
Creatinine, Ser: 0.66 mg/dL (ref 0.61–1.24)
GFR calc Af Amer: >60 mL/min (ref >60)
GFR calc non Af Amer: >60 mL/min (ref >60)
Glucose, Bld: 127 mg/dL — ABNORMAL HIGH (ref 70–99)
Potassium: 3.9 mmol/L (ref 3.5–5.1)
Sodium: 126 mmol/L — ABNORMAL LOW (ref 135–145)

## 2018-03-22 LAB — CBC
HCT: 26.7 % — ABNORMAL LOW (ref 39.0–52.0)
Hemoglobin: 8.7 g/dL — ABNORMAL LOW (ref 13.0–17.0)
MCH: 24.6 pg — ABNORMAL LOW (ref 26.0–34.0)
MCHC: 32.6 g/dL (ref 30.0–36.0)
MCV: 75.6 fL — ABNORMAL LOW (ref 80.0–100.0)
Platelets: 278 10*3/uL (ref 150–400)
RBC: 3.53 MIL/uL — ABNORMAL LOW (ref 4.22–5.81)
RDW: 19.9 % — AB (ref 11.5–15.5)
WBC: 16.3 10*3/uL — ABNORMAL HIGH (ref 4.0–10.5)
nRBC: 0 % (ref 0.0–0.2)

## 2018-03-22 MED ORDER — AMLODIPINE BESYLATE 10 MG PO TABS: 10.0000 mg | ORAL_TABLET | Freq: Every day | ORAL | 0 refills | Status: DC

## 2018-03-22 MED ORDER — CARVEDILOL 6.25 MG PO TABS: 6.2500 mg | ORAL_TABLET | Freq: Two times a day (BID) | ORAL | 1 refills | Status: DC

## 2018-03-22 MED ORDER — POTASSIUM CHLORIDE ER 20 MEQ PO TBCR: 20.0000 meq | EXTENDED_RELEASE_TABLET | Freq: Two times a day (BID) | ORAL | 0 refills | Status: DC

## 2018-03-22 MED ORDER — AMOXICILLIN-POT CLAVULANATE 875-125 MG PO TABS: 1.0000 | ORAL_TABLET | Freq: Two times a day (BID) | ORAL | 0 refills | Status: AC

## 2018-03-22 MED ORDER — FUROSEMIDE 40 MG PO TABS: 40.0000 mg | ORAL_TABLET | Freq: Two times a day (BID) | ORAL | 0 refills | Status: DC

## 2018-03-22 MED ORDER — ENSURE ENLIVE PO LIQD: 237.0000 mL | Freq: Two times a day (BID) | ORAL | 12 refills | Status: DC

## 2018-03-22 MED ORDER — LISINOPRIL 5 MG PO TABS: 5.0000 mg | ORAL_TABLET | Freq: Every day | ORAL | 0 refills | Status: DC

## 2018-03-22 NOTE — Progress Notes (Signed)
Patient and legal guardian (daughter, [name]) educated on "living with Hear Failure" book prior to discharge, they have been provided with follow up appointments and prescription regimen information. Patient home with home oxygen tank and no complaints at this time.

## 2018-03-22 NOTE — Progress Notes (Signed)
Patient ambulated today around nurses station, on 2-3 L oxygen via [place], Oxygen saturations range from 89-92% while ambulating this morning.

## 2018-03-22 NOTE — Discharge Summary (Signed)
[employer] - [place] [facility]   PATIENT NAME: Douglas Mathis    MR#:  [id number]  DATE OF BIRTH:  11/27/1935  DATE OF ADMISSION:  03/19/2018 ADMITTING PHYSICIAN: Enedina Finner, MD  DATE OF DISCHARGE: 03/22/2018  PRIMARY CARE PHYSICIAN: [facility]   ADMISSION DIAGNOSIS:  Acute pulmonary edema (HCC) [J81.0] Elevated troponin [R79.89] COPD exacerbation (HCC) [J44.1]  DISCHARGE DIAGNOSIS:  Active Problems:   Pulmonary edema with congestive heart failure (HCC) Acute diastolic heart failure exacerbation Elevated troponin secondary to demand ischemia Emphysema Hyperlipidemia Acute bronchitis Hypertension Hyponatremia SECONDARY DIAGNOSIS:   Past Medical History:  Diagnosis Date  . Anemia   . BPH (benign prostatic hyperplasia)   . COPD (chronic obstructive pulmonary disease) (HCC)   . Hypercholesterolemia   . Hypertension      ADMITTING HISTORY Douglas Mathis  is a 83 y.o. male with a known history of chronic respiratory failure/COPD on chronic 2.5 L nasal cannula oxygen, hypertension, BPH and glaucoma with chronic left eye loss of vision comes to the emergency room with increasing shortness of breath and leg swelling. Patient noticed more short winded nurse different from his COPD spells and some leg swelling. He also noticed left eye lid swelling since yesterday morning. Not remember any insect bite on her left eye. Denies any pain on the left eye.Workup in the  ER showed patient has pulmonary edema on x-ray consistent with congestive heart failure new onset acute diastolic. Echo in September 2019 showed EF of 60 to 65%.Received 40 mg of IV Lasix in the ER. He is being admitted for further evaluation of management.  HOSPITAL COURSE:  Patient was admitted to telemetry.  Patient was diuresed with Lasix.  Potassium was supplemented.  Patient had leukocytosis from bronchitis.  Patient was started on Augmentin antibiotic and his WBC count  improved.  Patient initially had to be on oxygen via nasal cannula at 4.5 L.  Once he was diuresed well he was weaned down to oxygen via nasal cannula at 2.5 L.  Troponins were trended.  He also had mild left eyelid swelling which improved.  He was worked up with echocardiogram which showed EF of 60 to 65% with normal systolic function.  Patient also has sodium levels on the lower side which will be followed up by primary care physician in the clinic.  Patient will be discharged home on oral Lasix for diuresis with cardiology appointment in the clinic.  CONSULTS OBTAINED:    DRUG ALLERGIES:  No Known Allergies  DISCHARGE MEDICATIONS:   Allergies as of 03/22/2018   No Known Allergies     Medication List    STOP taking these medications   amlodipine-atorvastatin 10-20 MG tablet Commonly known as:  CADUET   Metoprolol Tartrate 37.5 MG Tabs   predniSONE 20 MG tablet Commonly known as:  DELTASONE     TAKE these medications   ADVAIR DISKUS IN Inhale 1 puff into the lungs 2 (two) times daily.   amLODipine 10 MG tablet Commonly known as:  NORVASC Take 1 tablet (10 mg total) by mouth daily for 30 days. Start taking on:  March 23, 2018   amoxicillin-clavulanate 875-125 MG tablet Commonly known as:  AUGMENTIN Take 1 tablet by mouth every 12 (twelve) hours for 5 days.   carvedilol 6.25 MG tablet Commonly known as:  COREG Take 1 tablet (6.25 mg total) by mouth 2 (two) times daily.   DOK 100 MG capsule Generic drug:  docusate sodium Take 100 mg  by mouth 2 (two) times daily.   feeding supplement (ENSURE ENLIVE) Liqd Take 237 mLs by mouth 2 (two) times daily between meals.   ferrous sulfate 325 (65 FE) MG tablet Take 1 tablet (325 mg total) by mouth 2 (two) times daily with a meal.   Fluticasone-Umeclidin-Vilant 100-62.5-25 MCG/INH Aepb Inhale 1 puff into the lungs daily.   furosemide 40 MG tablet Commonly known as:  LASIX Take 1 tablet (40 mg total) by mouth 2 (two) times  daily for 30 days.   GNP VITAMIN C 500 MG tablet Generic drug:  ascorbic acid Take 500 mg by mouth 2 (two) times daily.   ipratropium-albuterol 0.5-2.5 (3) MG/3ML Soln Commonly known as:  DUONEB Take 3 mLs by nebulization every 6 (six) hours as needed.   lisinopril 5 MG tablet Commonly known as:  PRINIVIL,ZESTRIL Take 1 tablet (5 mg total) by mouth daily for 30 days.   lovastatin 40 MG tablet Commonly known as:  MEVACOR Take 40 mg by mouth at bedtime.   montelukast 10 MG tablet Commonly known as:  SINGULAIR Take 10 mg by mouth daily.   pantoprazole 40 MG tablet Commonly known as:  PROTONIX Take 40 mg by mouth 2 (two) times daily.   Potassium Chloride ER 20 MEQ Tbcr Take 20 mEq by mouth 2 (two) times daily for 30 days.   PROAIR HFA 108 (90 Base) MCG/ACT inhaler Generic drug:  albuterol Inhale 2 puffs into the lungs 4 (four) times daily as needed for wheezing or shortness of breath.   tamsulosin 0.4 MG Caps capsule Commonly known as:  FLOMAX Take 0.4 mg by mouth daily after breakfast.       Today  Patient seen and evaluated today No shortness of breath No cough Comfortable on oxygen via nasal cannula 2.5 L Hemodynamic stable Will be discharged home with home health services  VITAL SIGNS:  Blood pressure 136/66, pulse 73, temperature 98.6 F (37 C), temperature source Oral, resp. rate 19, height 5\' 8"  (1.727 m), weight 75.5 kg, SpO2 96 %.  I/O:    Intake/Output Summary (Last 24 hours) at 03/22/2018 1255 Last data filed at 03/22/2018 1023 Gross per 24 hour  Intake 963 ml  Output 2050 ml  Net -1087 ml    PHYSICAL EXAMINATION:  Physical Exam  GENERAL:  83 y.o.-year-old patient lying in the bed with no acute distress.  LUNGS: Normal breath sounds bilaterally, no wheezing, rales,rhonchi or crepitation. No use of accessory muscles of respiration.  CARDIOVASCULAR: S1, S2 normal. No murmurs, rubs, or gallops.  ABDOMEN: Soft, non-tender, non-distended. Bowel  sounds present. No organomegaly or mass.  NEUROLOGIC: Moves all 4 extremities. PSYCHIATRIC: The patient is alert and oriented x 3.  SKIN: No obvious rash, lesion, or ulcer.   DATA REVIEW:   CBC Recent Labs  Lab 03/22/18 0444  WBC 16.3*  HGB 8.7*  HCT 26.7*  PLT 278    Chemistries  Recent Labs  Lab 03/22/18 0444  NA 126*  K 3.9  CL 86*  CO2 30  GLUCOSE 127*  BUN 11  CREATININE 0.66  CALCIUM 8.1*    Cardiac Enzymes Recent Labs  Lab 03/20/18 2147  TROPONINI 0.04*    Microbiology Results  Results for orders placed or performed during the hospital encounter of 02/26/18  MRSA PCR Screening     Status: None   Collection Time: 02/27/18  8:40 AM  Result Value Ref Range Status   MRSA by PCR NEGATIVE NEGATIVE Final    Comment:  The GeneXpert MRSA Assay (FDA approved for NASAL specimens only), is one component of a comprehensive MRSA colonization surveillance program. It is not intended to diagnose MRSA infection nor to guide or monitor treatment for MRSA infections. Performed at [facility], [address], [place], [place] [address]     RADIOLOGY:  No results found.  Follow up with PCP in 1 week.  Management plans discussed with the patient, family and they are in agreement.  CODE STATUS: DNR    Code Status Orders  (From admission, onward)         Start     Ordered   03/19/18 1200  Do not attempt resuscitation (DNR)  Continuous    Question Answer Comment  In the event of cardiac or respiratory ARREST Do not call a "code blue"   In the event of cardiac or respiratory ARREST Do not perform Intubation, CPR, defibrillation or ACLS   In the event of cardiac or respiratory ARREST Use medication by any route, position, wound care, and other measures to relive pain and suffering. May use oxygen, suction and manual treatment of airway obstruction as needed for comfort.   Comments nurse may pronounce      03/19/18 1159        Code  Status History    Date Active Date Inactive Code Status Order ID Comments User Context   02/27/2018 0706 03/01/2018 1607 Partial Code [id number]  Bertrum Sol, MD ED   10/25/2017 1225 10/27/2017 1929 DNR [id number]  Alford Highland, MD ED   07/08/2015 2030 07/11/2015 1358 DNR [id number]  Altamese Dilling, MD Inpatient      TOTAL TIME TAKING CARE OF THIS PATIENT ON DAY OF DISCHARGE: more than 35 minutes.   Ihor Austin M.D on 03/22/2018 at 12:55 PM  Between 7am to 6pm - Pager - [phone]  After 6pm go to [email or web address] - password [email or web address] [facility]  [facility] [place] [facility]  Office  [phone]  CC: Primary care physician; [facility]  Note: This dictation was prepared with Nurse, children's dictation along with smaller phrase technology. Any transcriptional errors that result from this process are unintentional.

## 2018-03-22 NOTE — Care Management Important Message (Signed)
Copy of signed Medicare IM left with patient in room. 

## 2018-03-25 ENCOUNTER — Ambulatory Visit: Payer: Medicare Other | Admitting: Family

## 2018-03-25 ENCOUNTER — Telehealth: Payer: Self-pay

## 2018-03-25 NOTE — Telephone Encounter (Signed)
Flagged on EMMI report for not reading discharge papers and not knowing who to call about changes in condition.  First attempt to reach patient made, however unable to reach patient.  Left voicemail encouraging callback. Will attempt at later time.

## 2018-03-25 NOTE — Telephone Encounter (Signed)
Patient's daughter returned call.  They are aware of who to call regarding changes in condition and follow up appointments and have their discharge papers to reference.  No other issues or concerns at this time.  I thanked her for her time and informed her that they would receive one more call checking in over the next few days.

## 2018-03-30 ENCOUNTER — Ambulatory Visit: Payer: Medicare Other | Attending: Family | Admitting: Family

## 2018-03-30 ENCOUNTER — Encounter: Payer: Self-pay | Admitting: Family

## 2018-03-30 VITALS — BP 105/48 | HR 71 | Resp 18 | Ht 69.0 in | Wt 159.0 lb

## 2018-03-30 DIAGNOSIS — Z79899 Other long term (current) drug therapy: Secondary | ICD-10-CM | POA: Insufficient documentation

## 2018-03-30 DIAGNOSIS — F17210 Nicotine dependence, cigarettes, uncomplicated: Secondary | ICD-10-CM | POA: Diagnosis not present

## 2018-03-30 DIAGNOSIS — D649 Anemia, unspecified: Secondary | ICD-10-CM | POA: Diagnosis not present

## 2018-03-30 DIAGNOSIS — I10 Essential (primary) hypertension: Secondary | ICD-10-CM

## 2018-03-30 DIAGNOSIS — H02846 Edema of left eye, unspecified eyelid: Secondary | ICD-10-CM

## 2018-03-30 DIAGNOSIS — I5032 Chronic diastolic (congestive) heart failure: Secondary | ICD-10-CM

## 2018-03-30 DIAGNOSIS — H5789 Other specified disorders of eye and adnexa: Secondary | ICD-10-CM | POA: Diagnosis not present

## 2018-03-30 DIAGNOSIS — E7800 Pure hypercholesterolemia, unspecified: Secondary | ICD-10-CM | POA: Diagnosis not present

## 2018-03-30 DIAGNOSIS — I110 Hypertensive heart disease with heart failure: Secondary | ICD-10-CM | POA: Insufficient documentation

## 2018-03-30 DIAGNOSIS — Z9981 Dependence on supplemental oxygen: Secondary | ICD-10-CM | POA: Diagnosis not present

## 2018-03-30 DIAGNOSIS — R05 Cough: Secondary | ICD-10-CM | POA: Insufficient documentation

## 2018-03-30 DIAGNOSIS — Z7901 Long term (current) use of anticoagulants: Secondary | ICD-10-CM | POA: Insufficient documentation

## 2018-03-30 DIAGNOSIS — J449 Chronic obstructive pulmonary disease, unspecified: Secondary | ICD-10-CM | POA: Diagnosis not present

## 2018-03-30 DIAGNOSIS — R0602 Shortness of breath: Secondary | ICD-10-CM | POA: Diagnosis present

## 2018-03-30 NOTE — Progress Notes (Signed)
Patient ID: Douglas Mathis, male    DOB: [birth year], 83 y.o.   MRN: [id number]  HPI  Mr Pizzini is a 83 y/o male with a history of hyperlipidemia, HTN, COPD, anemia, current tobacco use and chronic heart failure.   Echo report from 10/25/17 reviewed and showed an EF of 60-65% along with moderate TR.   Admitted 03/19/2018 due to pulmonary edema. Initially given IV lasix and then transitioned to oral diuretics. Initially was on 4.5L oxygen and then weaned down to home oxygen 2.5L. Discharged after 3 days. Admitted 02/26/2018 due to COPD and influenza B. Initially needed bipap and then was weaned down to home oxygen. Given nebulizer, IV steroids with transition to oral steroids. Discharged after 3 days.   He presents today for his initial visit with a chief complaint of minimal shortness of breath upon moderate exertion. He describes this as chronic in nature having been present for several years. He has associated cough, shortness of breath, pedal edema and left eye redness along with this. He denies any difficulty sleeping, dizziness, abdominal distention, palpitations, chest pain or weight gain. Hasn't been weighing consistently every day. Has an appointment scheduled with [facility] but has previously seen Dr. Juliann Pares.   Past Medical History:  Diagnosis Date  . Anemia   . BPH (benign prostatic hyperplasia)   . CHF (congestive heart failure) (HCC)   . COPD (chronic obstructive pulmonary disease) (HCC)   . Hypercholesterolemia   . Hypertension    Past Surgical History:  Procedure Laterality Date  . cataracts    . ESOPHAGOGASTRODUODENOSCOPY N/A 07/10/2015   Procedure: ESOPHAGOGASTRODUODENOSCOPY (EGD);  Surgeon: Scot Jun, MD;  Location: [facility] ENDOSCOPY;  Service: Endoscopy;  Laterality: N/A;  . ESOPHAGOGASTRODUODENOSCOPY N/A 01/18/2018   Procedure: ESOPHAGOGASTRODUODENOSCOPY (EGD);  Surgeon: Toledo, Boykin Nearing, MD;  Location: [facility] ENDOSCOPY;  Service: Gastroenterology;  Laterality: N/A;    Family History  Problem Relation Age of Onset  . Diabetes Mother   . CAD Mother   . Bone cancer Brother   . Bronchitis Father    Social History   Tobacco Use  . Smoking status: Current Every Day Smoker    Packs/day: 1.00  . Smokeless tobacco: Never Used  Substance Use Topics  . Alcohol use: Not Currently    Comment: used to drink a bottle on the weekend with friends   No Known Allergies Prior to Admission medications   Medication Sig Start Date End Date Taking? Authorizing Provider  amLODipine (NORVASC) 10 MG tablet Take 1 tablet (10 mg total) by mouth daily for 30 days. 03/23/18 04/22/18 Yes Pyreddy, Vivien Rota, MD  carvedilol (COREG) 6.25 MG tablet Take 1 tablet (6.25 mg total) by mouth 2 (two) times daily. 03/22/18 03/22/19 Yes Pyreddy, Pavan, MD  DOK 100 MG capsule Take 100 mg by mouth 2 (two) times daily. 09/28/17  Yes [provider]  feeding supplement, ENSURE ENLIVE, (ENSURE ENLIVE) LIQD Take 237 mLs by mouth 2 (two) times daily between meals. 03/22/18  Yes Pyreddy, Vivien Rota, MD  ferrous sulfate 325 (65 FE) MG tablet Take 1 tablet (325 mg total) by mouth 2 (two) times daily with a meal. 07/22/15  Yes Wieting, Richard, MD  Fluticasone-Salmeterol (ADVAIR DISKUS IN) Inhale 1 puff into the lungs 2 (two) times daily.   Yes [provider]  Fluticasone-Umeclidin-Vilant 100-62.5-25 MCG/INH AEPB Inhale 1 puff into the lungs daily. 12/13/17  Yes [provider]  furosemide (LASIX) 40 MG tablet Take 1 tablet (40 mg total) by mouth 2 (two) times  daily for 30 days. 03/22/18 04/21/18 Yes Pyreddy, Vivien RotaPavan, MD  GNP VITAMIN C 500 MG tablet Take 500 mg by mouth 2 (two) times daily. 09/22/17  Yes [provider]  ipratropium-albuterol (DUONEB) 0.5-2.5 (3) MG/3ML SOLN Take 3 mLs by nebulization every 6 (six) hours as needed. 10/27/17  Yes Salary, Evelena AsaMontell D, MD  lisinopril (PRINIVIL,ZESTRIL) 5 MG tablet Take 1 tablet (5 mg total) by mouth daily for 30 days. 03/22/18 04/21/18 Yes  Pyreddy, Vivien RotaPavan, MD  lovastatin (MEVACOR) 40 MG tablet Take 40 mg by mouth at bedtime. 09/23/17  Yes [provider]  montelukast (SINGULAIR) 10 MG tablet Take 10 mg by mouth daily. 10/22/17  Yes [provider]  pantoprazole (PROTONIX) 40 MG tablet Take 40 mg by mouth 2 (two) times daily. 12/08/17  Yes [provider]  potassium chloride 20 MEQ TBCR Take 20 mEq by mouth 2 (two) times daily for 30 days. 03/22/18 04/21/18 Yes Pyreddy, Vivien RotaPavan, MD  PROAIR HFA 108 [phone](90 Base) MCG/ACT inhaler Inhale 2 puffs into the lungs 4 (four) times daily as needed for wheezing or shortness of breath.  08/04/17  Yes [provider]  tamsulosin (FLOMAX) 0.4 MG CAPS capsule Take 0.4 mg by mouth daily after breakfast.    Yes [provider]    Review of Systems  Constitutional: Positive for fatigue (minimal). Negative for appetite change.  HENT: Positive for rhinorrhea. Negative for congestion and sore throat.   Eyes: Positive for redness (left eye).  Respiratory: Positive for cough and shortness of breath (minimal).   Cardiovascular: Positive for leg swelling (around ankles). Negative for chest pain and palpitations.  Gastrointestinal: Negative for abdominal distention and abdominal pain.  Endocrine: Negative.   Genitourinary: Negative.   Musculoskeletal: Negative for back pain and neck pain.  Skin: Negative.   Allergic/Immunologic: Negative.   Neurological: Negative for dizziness and light-headedness.  Hematological: Negative for adenopathy. Does not bruise/bleed easily.  Psychiatric/Behavioral: Negative for decreased concentration and sleep disturbance (sleeping on 2 pillows). The patient is not nervous/anxious.     Vitals:   03/30/18 1321  BP: (!) 105/48  Pulse: 71  Resp: 18  SpO2: 98%  Weight: 159 lb (72.1 kg)  Height: 5\' 9"  (1.753 m)   Wt Readings from Last 3 Encounters:  03/30/18 159 lb (72.1 kg)  03/22/18 166 lb 8 oz (75.5 kg)  02/27/18 162 lb 0.6 oz (73.5  kg)   Lab Results  Component Value Date   CREATININE 0.66 03/22/2018   CREATININE 0.67 03/21/2018   CREATININE 0.68 03/20/2018    Physical Exam Vitals signs and nursing note reviewed.  Constitutional:      Appearance: Normal appearance.  HENT:     Head: Normocephalic and atraumatic.  Eyes:     Conjunctiva/sclera:     Left eye: Left conjunctiva is injected.     Comments: Left eyelid swelling  Neck:     Musculoskeletal: Normal range of motion and neck supple.  Cardiovascular:     Rate and Rhythm: Normal rate and regular rhythm.  Pulmonary:     Effort: Pulmonary effort is normal. No respiratory distress.     Breath sounds: No wheezing or rales.  Abdominal:     General: There is no distension.     Palpations: Abdomen is soft.  Musculoskeletal:        General: No tenderness.     Right lower leg: Edema (trace pitting) present.     Left lower leg: Edema (trace pitting) present.  Skin:  General: Skin is warm and dry.  Neurological:     General: No focal deficit present.     Mental Status: He is alert and oriented to person, place, and time.  Psychiatric:        Mood and Affect: Mood normal.        Behavior: Behavior normal.    Assessment & Plan:  1: Chronic heart failure with preserved ejection fraction- - NYHA class II - euvolemic today - not weighing daily but does have scales. Instructed him to weigh every morning after using the bathroom, write the weight down and call for an overnight weight gain of >2 pounds or a weekly weight gain of >5 pounds - not adding salt and family is trying to have him eat low sodium foods. Reviewed the importance of closely following a 2000mg  sodium diet and written dietary information was given to him about this - saw cardiology Juliann Pares) 12/22/17 but currently has an appointment scheduled with [facility]. Patient would like to stay with Dr. Juliann Pares so advised them that if that's what they would like to do, all they need to do is cancel the  [facility] appointment and make a follow-up with Dr. Juliann Pares - BNP 03/19/2018 was 264.0 - reports receiving his flu vaccine for this season  2: HTN-  - BP looks good today although on the low side - follows with PCP at [facility] & has an appointment for next week - BMP 03/22/2018 reviewed and showed sodium 126, potassium 3.9, creatinine 0.66 and GFR >60  3: COPD- - saw pulmonology Meredeth Ide) 12/13/17 - now down to smoking 1 cigarette daily - complete cessation discussed for 3 minutes with him  4: Left eye edema- - he says that this occurred while admitted and thinks the oxygen nasal cannula prong may have ended up in his eye - has redness in his left eye as well but no drainage - no worsening of blurry vision than he normally has - goes to [facility] and he was encouraged to give them a call to see if he needed to be seen  Medication list was reviewed.  Return in 2 months or sooner for any questions/problems before then.

## 2018-03-30 NOTE — Patient Instructions (Signed)
Continue weighing daily and call for an overnight weight gain of > 2 pounds or a weekly weight gain of >5 pounds. 

## 2018-03-31 ENCOUNTER — Encounter: Payer: Self-pay | Admitting: Family

## 2018-03-31 DIAGNOSIS — H02846 Edema of left eye, unspecified eyelid: Secondary | ICD-10-CM | POA: Insufficient documentation

## 2018-03-31 DIAGNOSIS — I5032 Chronic diastolic (congestive) heart failure: Secondary | ICD-10-CM | POA: Insufficient documentation

## 2018-03-31 DIAGNOSIS — I5033 Acute on chronic diastolic (congestive) heart failure: Secondary | ICD-10-CM | POA: Insufficient documentation

## 2018-03-31 DIAGNOSIS — I10 Essential (primary) hypertension: Secondary | ICD-10-CM | POA: Insufficient documentation

## 2018-04-08 ENCOUNTER — Ambulatory Visit: Payer: Medicare Other | Admitting: Physician Assistant

## 2018-04-18 ENCOUNTER — Ambulatory Visit: Payer: Medicare Other | Admitting: Physician Assistant

## 2018-06-08 ENCOUNTER — Ambulatory Visit: Payer: Medicare Other | Admitting: Family

## 2018-08-01 ENCOUNTER — Ambulatory Visit: Payer: Medicare Other | Admitting: Family

## 2019-04-01 ENCOUNTER — Other Ambulatory Visit: Payer: Self-pay

## 2019-04-01 ENCOUNTER — Emergency Department
Admission: EM | Admit: 2019-04-01 | Discharge: 2019-04-02 | Disposition: A | Discharge status: Home / Self Care | Payer: Medicare Other | Attending: Emergency Medicine | Admitting: Emergency Medicine

## 2019-04-01 ENCOUNTER — Emergency Department: Payer: Medicare Other

## 2019-04-01 DIAGNOSIS — F17200 Nicotine dependence, unspecified, uncomplicated: Secondary | ICD-10-CM | POA: Insufficient documentation

## 2019-04-01 DIAGNOSIS — I5032 Chronic diastolic (congestive) heart failure: Secondary | ICD-10-CM | POA: Diagnosis not present

## 2019-04-01 DIAGNOSIS — I110 Hypertensive heart disease with heart failure: Secondary | ICD-10-CM | POA: Insufficient documentation

## 2019-04-01 DIAGNOSIS — Z79899 Other long term (current) drug therapy: Secondary | ICD-10-CM | POA: Insufficient documentation

## 2019-04-01 DIAGNOSIS — Z20822 Contact with and (suspected) exposure to covid-19: Secondary | ICD-10-CM | POA: Diagnosis not present

## 2019-04-01 DIAGNOSIS — J441 Chronic obstructive pulmonary disease with (acute) exacerbation: Secondary | ICD-10-CM

## 2019-04-01 DIAGNOSIS — R0602 Shortness of breath: Secondary | ICD-10-CM | POA: Diagnosis present

## 2019-04-01 LAB — COMPREHENSIVE METABOLIC PANEL
ALT: 14 U/L (ref 0–44)
AST: 17 U/L (ref 15–41)
Albumin: 4.3 g/dL (ref 3.5–5.0)
Alkaline Phosphatase: 59 U/L (ref 38–126)
Anion gap: 10 (ref 5–15)
BUN: 28 mg/dL — ABNORMAL HIGH (ref 8–23)
CO2: 34 mmol/L — ABNORMAL HIGH (ref 22–32)
Calcium: 9.2 mg/dL (ref 8.9–10.3)
Chloride: 83 mmol/L — ABNORMAL LOW (ref 98–111)
Creatinine, Ser: 0.99 mg/dL (ref 0.61–1.24)
GFR calc Af Amer: >60 mL/min (ref >60)
GFR calc non Af Amer: >60 mL/min (ref >60)
Glucose, Bld: 165 mg/dL — ABNORMAL HIGH (ref 70–99)
Potassium: 5.6 mmol/L — ABNORMAL HIGH (ref 3.5–5.1)
Sodium: 127 mmol/L — ABNORMAL LOW (ref 135–145)
Total Bilirubin: 0.5 mg/dL (ref 0.3–1.2)
Total Protein: 8 g/dL (ref 6.5–8.1)

## 2019-04-01 LAB — RESPIRATORY PANEL BY RT PCR (FLU A&B, COVID)
Influenza A by PCR: NEGATIVE
Influenza B by PCR: NEGATIVE
SARS Coronavirus 2 by RT PCR: NEGATIVE

## 2019-04-01 LAB — CBC
HCT: 30.6 % — ABNORMAL LOW (ref 39.0–52.0)
Hemoglobin: 9.6 g/dL — ABNORMAL LOW (ref 13.0–17.0)
MCH: 28 pg (ref 26.0–34.0)
MCHC: 31.4 g/dL (ref 30.0–36.0)
MCV: 89.2 fL (ref 80.0–100.0)
Platelets: 222 10*3/uL (ref 150–400)
RBC: 3.43 MIL/uL — ABNORMAL LOW (ref 4.22–5.81)
RDW: 18.1 % — ABNORMAL HIGH (ref 11.5–15.5)
WBC: 11.1 10*3/uL — ABNORMAL HIGH (ref 4.0–10.5)
nRBC: 0 % (ref 0.0–0.2)

## 2019-04-01 LAB — TROPONIN I (HIGH SENSITIVITY)
Troponin I (High Sensitivity): 10 ng/L (ref <18)
Troponin I (High Sensitivity): 7 ng/L (ref <18)

## 2019-04-01 MED ORDER — AMOXICILLIN-POT CLAVULANATE 875-125 MG PO TABS: 1.0000 | ORAL_TABLET | Freq: Two times a day (BID) | ORAL | 0 refills | Status: DC

## 2019-04-01 MED ORDER — MAGNESIUM SULFATE 2 GM/50ML IV SOLN
2.0000 g | INTRAVENOUS | Status: AC
  Administered 2019-04-01: 22:00:00 2 g via INTRAVENOUS
  Filled 2019-04-01: qty 50

## 2019-04-01 MED ORDER — METHYLPREDNISOLONE SODIUM SUCC 125 MG IJ SOLR
80.0000 mg | Freq: Once | INTRAMUSCULAR | Status: AC
  Administered 2019-04-01: 21:00:00 80 mg via INTRAVENOUS
  Filled 2019-04-01: qty 2

## 2019-04-01 MED ORDER — IPRATROPIUM-ALBUTEROL 0.5-2.5 (3) MG/3ML IN SOLN
3.0000 mL | Freq: Once | RESPIRATORY_TRACT | Status: AC
  Administered 2019-04-01: 3 mL via RESPIRATORY_TRACT
  Filled 2019-04-01: qty 3

## 2019-04-01 MED ORDER — PREDNISONE 20 MG PO TABS: 40.0000 mg | ORAL_TABLET | Freq: Every day | ORAL | 0 refills | Status: DC

## 2019-04-01 MED ORDER — ALBUTEROL SULFATE (2.5 MG/3ML) 0.083% IN NEBU
5.0000 mg | INHALATION_SOLUTION | Freq: Once | RESPIRATORY_TRACT | Status: AC
  Administered 2019-04-01: 23:00:00 5 mg via RESPIRATORY_TRACT
  Filled 2019-04-01: qty 6

## 2019-04-01 NOTE — ED Provider Notes (Signed)
[facility] Emergency Department Provider Note  ____________________________________________  Time seen: Approximately 11:01 PM  I have reviewed the triage vital signs and the nursing notes.   HISTORY  Chief Complaint Shortness of Breath    HPI Douglas Mathis is a 84 y.o. male with a history of hypertension hyperlipidemia CHF and COPD who comes the ED due to shortness of breath, gradual onset and worsening throughout the day.  No relief with his inhaler at home.  Denies fevers chills or body aches.  No chest pain.  Symptoms are constant without aggravating or alleviating.      Past Medical History:  Diagnosis Date  . Anemia   . BPH (benign prostatic hyperplasia)   . CHF (congestive heart failure) (HCC)   . COPD (chronic obstructive pulmonary disease) (HCC)   . Hypercholesterolemia   . Hypertension      Patient Active Problem List   Diagnosis Date Noted  . Chronic diastolic (congestive) heart failure (HCC) 03/31/2018  . HTN (hypertension) 03/31/2018  . Eyelid edema, left 03/31/2018  . COPD (chronic obstructive pulmonary disease) (HCC) 02/26/2018  . COPD exacerbation (HCC) 10/25/2017     Past Surgical History:  Procedure Laterality Date  . cataracts    . ESOPHAGOGASTRODUODENOSCOPY N/A 07/10/2015   Procedure: ESOPHAGOGASTRODUODENOSCOPY (EGD);  Surgeon: Scot Jun, MD;  Location: [facility] ENDOSCOPY;  Service: Endoscopy;  Laterality: N/A;  . ESOPHAGOGASTRODUODENOSCOPY N/A 01/18/2018   Procedure: ESOPHAGOGASTRODUODENOSCOPY (EGD);  Surgeon: Toledo, Boykin Nearing, MD;  Location: [facility] ENDOSCOPY;  Service: Gastroenterology;  Laterality: N/A;     Prior to Admission medications   Medication Sig Start Date End Date Taking? Authorizing Provider  amLODipine (NORVASC) 10 MG tablet Take 1 tablet (10 mg total) by mouth daily for 30 days. 03/23/18 04/22/18  Ihor Austin, MD  amoxicillin-clavulanate (AUGMENTIN) 875-125 MG tablet Take 1 tablet by mouth 2 (two)  times daily. 04/01/19   Sharman Cheek, MD  carvedilol (COREG) 6.25 MG tablet Take 1 tablet (6.25 mg total) by mouth 2 (two) times daily. 03/22/18 03/22/19  Pyreddy, Vivien Rota, MD  DOK 100 MG capsule Take 100 mg by mouth 2 (two) times daily. 09/28/17   [provider]  feeding supplement, ENSURE ENLIVE, (ENSURE ENLIVE) LIQD Take 237 mLs by mouth 2 (two) times daily between meals. 03/22/18   Ihor Austin, MD  ferrous sulfate 325 (65 FE) MG tablet Take 1 tablet (325 mg total) by mouth 2 (two) times daily with a meal. 07/22/15   Renae Gloss, Richard, MD  Fluticasone-Salmeterol (ADVAIR DISKUS IN) Inhale 1 puff into the lungs 2 (two) times daily.    [provider]  Fluticasone-Umeclidin-Vilant 100-62.5-25 MCG/INH AEPB Inhale 1 puff into the lungs daily. 12/13/17   [provider]  furosemide (LASIX) 40 MG tablet Take 1 tablet (40 mg total) by mouth 2 (two) times daily for 30 days. 03/22/18 04/21/18  Ihor Austin, MD  GNP VITAMIN C 500 MG tablet Take 500 mg by mouth 2 (two) times daily. 09/22/17   [provider]  ipratropium-albuterol (DUONEB) 0.5-2.5 (3) MG/3ML SOLN Take 3 mLs by nebulization every 6 (six) hours as needed. 10/27/17   Salary, Evelena Asa, MD  lisinopril (PRINIVIL,ZESTRIL) 5 MG tablet Take 1 tablet (5 mg total) by mouth daily for 30 days. 03/22/18 04/21/18  Ihor Austin, MD  lovastatin (MEVACOR) 40 MG tablet Take 40 mg by mouth at bedtime. 09/23/17   [provider]  montelukast (SINGULAIR) 10 MG tablet Take 10 mg by mouth daily. 10/22/17   [provider]  pantoprazole (PROTONIX) 40 MG tablet Take 40 mg by mouth 2 (two) times daily. 12/08/17   [provider]  potassium chloride 20 MEQ TBCR Take 20 mEq by mouth 2 (two) times daily for 30 days. 03/22/18 04/21/18  Saundra Shelling, MD  predniSONE (DELTASONE) 20 MG tablet Take 2 tablets (40 mg total) by mouth daily. 04/01/19   Carrie Mew, MD  PROAIR HFA 108 [phone] Base) MCG/ACT inhaler Inhale 2  puffs into the lungs 4 (four) times daily as needed for wheezing or shortness of breath.  08/04/17   [provider]  tamsulosin (FLOMAX) 0.4 MG CAPS capsule Take 0.4 mg by mouth daily after breakfast.     [provider]     Allergies Patient has no known allergies.   Family History  Problem Relation Age of Onset  . Diabetes Mother   . CAD Mother   . Bone cancer Brother   . Bronchitis Father     Social History Social History   Tobacco Use  . Smoking status: Current Every Day Smoker    Packs/day: 1.00  . Smokeless tobacco: Never Used  Substance Use Topics  . Alcohol use: Not Currently    Comment: used to drink a bottle on the weekend with friends  . Drug use: Never    Review of Systems  Constitutional:   No fever or chills.  ENT:   No sore throat. No rhinorrhea. Cardiovascular:   No chest pain or syncope. Respiratory: Positive shortness of breath without cough. Gastrointestinal:   Negative for abdominal pain, vomiting and diarrhea.  Musculoskeletal:   Negative for focal pain or swelling All other systems reviewed and are negative except as documented above in ROS and HPI.  ____________________________________________   PHYSICAL EXAM:  VITAL SIGNS: ED Triage Vitals  Enc Vitals Group     BP 04/01/19 2109 (!) 150/44     Pulse Rate 04/01/19 2130 72     Resp 04/01/19 2109 (!) 25     Temp 04/01/19 2109 98 F (36.7 C)     Temp Source 04/01/19 2109 Oral     SpO2 04/01/19 2109 100 %     Weight 04/01/19 2106 158 lb 11.7 oz (72 kg)     Height 04/01/19 2106 5\' 3"  (1.6 m)     Head Circumference --      Peak Flow --      Pain Score 04/01/19 2106 0     Pain Loc --      Pain Edu? --      Excl. in [place]? --     Vital signs reviewed, nursing assessments reviewed.   Constitutional:   Alert and oriented. Non-toxic appearance. Eyes:   Conjunctivae are normal. EOMI. PERRL. ENT      Head:   Normocephalic and atraumatic.      Nose:   Wearing a mask.       Mouth/Throat:   Wearing a mask.      Neck:   No meningismus. Full ROM. Hematological/Lymphatic/Immunilogical:   No cervical lymphadenopathy. Cardiovascular:   RRR. Symmetric bilateral radial and DP pulses.  No murmurs. Cap refill less than 2 seconds. Respiratory:   Normal respiratory effort without tachypnea/retractions.  Diminished air movement, diffuse expiratory wheezing with prolonged expiratory phase Gastrointestinal:   Soft and nontender. Non distended. There is no CVA tenderness.  No rebound, rigidity, or guarding.  Musculoskeletal:   Normal range of motion in all extremities. No joint effusions.  No lower extremity tenderness.  No  edema. Neurologic:   Normal speech and language.  Motor grossly intact. No acute focal neurologic deficits are appreciated.  Skin:    Skin is warm, dry and intact. No rash noted.  No petechiae, purpura, or bullae.  ____________________________________________    LABS (pertinent positives/negatives) (all labs ordered are listed, but only abnormal results are displayed) Labs Reviewed  CBC - Abnormal; Notable for the following components:      Result Value   WBC 11.1 (*)    RBC 3.43 (*)    Hemoglobin 9.6 (*)    HCT 30.6 (*)    RDW 18.1 (*)    All other components within normal limits  COMPREHENSIVE METABOLIC PANEL - Abnormal; Notable for the following components:   Sodium 127 (*)    Potassium 5.6 (*)    Chloride 83 (*)    CO2 34 (*)    Glucose, Bld 165 (*)    BUN 28 (*)    All other components within normal limits  RESPIRATORY PANEL BY RT PCR (FLU A&B, COVID)  TROPONIN I (HIGH SENSITIVITY)  TROPONIN I (HIGH SENSITIVITY)   ____________________________________________   EKG  Interpreted by me Sinus rhythm rate of 71, left axis, right bundle branch block.  No acute ischemic changes.  Unchanged compared to previous EKG 03/19/2018 and 02/26/2018  ____________________________________________    RADIOLOGY  DG Chest Portable 1 View  Result  Date: 04/01/2019 CLINICAL DATA:  Shortness of breath and cough. EXAM: PORTABLE CHEST 1 VIEW COMPARISON:  03/19/2018 FINDINGS: Again noted are chronic airspace opacities at the lung bases bilaterally, left worse than right. There is a chronic left-sided pleural effusion which appears relatively stable from prior study. The heart size is essentially unchanged from prior study. Aortic calcifications are noted. There is no pneumothorax. No acute osseous abnormality. IMPRESSION: 1. Chronic bibasilar airspace opacities favored to represent atelectasis or scarring. A superimposed infectious process is difficult to entirely exclude. 2. Chronic left-sided pleural effusion and/or pleural thickening. 3. COPD. 4.  Aortic Atherosclerosis (ICD10-I70.0). Electronically Signed   By: Katherine Mantle M.D.   On: 04/01/2019 21:38    ____________________________________________   PROCEDURES Procedures  ____________________________________________  DIFFERENTIAL DIAGNOSIS   Pneumonia, pleural effusion, pulmonary edema, COPD exacerbation, COVID-19  CLINICAL IMPRESSION / ASSESSMENT AND PLAN / ED COURSE  Medications ordered in the ED: Medications  magnesium sulfate IVPB 2 g 50 mL (2 g Intravenous New Bag/Given 04/01/19 2204)  methylPREDNISolone sodium succinate (SOLU-MEDROL) 125 mg/2 mL injection 80 mg (80 mg Intravenous Given 04/01/19 2119)  ipratropium-albuterol (DUONEB) 0.5-2.5 (3) MG/3ML nebulizer solution 3 mL (3 mLs Nebulization Given 04/01/19 2250)  albuterol (PROVENTIL) (2.5 MG/3ML) 0.083% nebulizer solution 5 mg (5 mg Nebulization Given 04/01/19 2250)    Pertinent labs & imaging results that were available during my care of the patient were reviewed by me and considered in my medical decision making (see chart for details).  NYLE LIMB was evaluated in Emergency Department on 04/01/2019 for the symptoms described in the history of present illness. He was evaluated in the context of the global COVID-19  pandemic, which necessitated consideration that the patient might be at risk for infection with the SARS-CoV-2 virus that causes COVID-19. Institutional protocols and algorithms that pertain to the evaluation of patients at risk for COVID-19 are in a state of rapid change based on information released by regulatory bodies including the CDC and federal and state organizations. These policies and algorithms were followed during the patient's care in the ED.   Patient presents  with worsening shortness of breath throughout the day.  Vital signs unremarkable, no respiratory distress.Considering the patient's symptoms, medical history, and physical examination today, I have low suspicion for ACS, PE, TAD, pneumothorax, carditis, mediastinitis, pneumonia, CHF, or sepsis.  Patient given initial magnesium infusion and Solu-Medrol.  Covid negative so was also given albuterol and DuoNeb.  Feels much better, vital signs are normal, chest x-ray and labs are reassuring.  He has chronic hyponatremia, labs are essentially at baseline.  Initial troponin is normal and I do not think this needs to be repeated since he has been having continuous symptoms for the whole day.  I will start him on Augmentin and prednisone, follow-up with primary care.        ____________________________________________   FINAL CLINICAL IMPRESSION(S) / ED DIAGNOSES    Final diagnoses:  COPD exacerbation [facility])     ED Discharge Orders         Ordered    predniSONE (DELTASONE) 20 MG tablet  Daily     04/01/19 2300    amoxicillin-clavulanate (AUGMENTIN) 875-125 MG tablet  2 times daily     04/01/19 2300          Portions of this note were generated with dragon dictation software. Dictation errors may occur despite best attempts at proofreading.   Sharman Cheek, MD 04/01/19 2314

## 2019-04-01 NOTE — ED Triage Notes (Signed)
Patient coming [facility] from [facility]. Patient c/o SOB that is getting progressively worse throughout the day. Patient has hx of emphysema and COPD. Patient has been using home inhaler all day with no relief. Patient wears 2L [place] of oxygen at baseline.  EMS vitals: 160/66, HR 79, temp 97.7, 100% on 2L [place].  Per EMS patient's lung sounds diminished, but clear all fields.   Patient with expiratory wheezes throughout all fields on arrival.

## 2019-04-02 NOTE — ED Notes (Signed)
Legal guardian contacted and given DC instructions. This put instructions into legal guardians hands.

## 2019-06-02 ENCOUNTER — Other Ambulatory Visit: Payer: Self-pay

## 2019-06-02 ENCOUNTER — Emergency Department: Payer: Medicare Other

## 2019-06-02 ENCOUNTER — Inpatient Hospital Stay
Admission: EM | Admit: 2019-06-02 | Discharge: 2019-06-06 | DRG: 640 | Disposition: A | Discharge status: Skilled Nursing Facility | Payer: Medicare Other | Attending: Internal Medicine | Admitting: Internal Medicine

## 2019-06-02 ENCOUNTER — Encounter: Payer: Self-pay | Admitting: Emergency Medicine

## 2019-06-02 DIAGNOSIS — I951 Orthostatic hypotension: Secondary | ICD-10-CM | POA: Diagnosis present

## 2019-06-02 DIAGNOSIS — W19XXXA Unspecified fall, initial encounter: Secondary | ICD-10-CM

## 2019-06-02 DIAGNOSIS — F17210 Nicotine dependence, cigarettes, uncomplicated: Secondary | ICD-10-CM | POA: Diagnosis present

## 2019-06-02 DIAGNOSIS — N400 Enlarged prostate without lower urinary tract symptoms: Secondary | ICD-10-CM | POA: Diagnosis present

## 2019-06-02 DIAGNOSIS — I5032 Chronic diastolic (congestive) heart failure: Secondary | ICD-10-CM | POA: Diagnosis present

## 2019-06-02 DIAGNOSIS — E785 Hyperlipidemia, unspecified: Secondary | ICD-10-CM | POA: Diagnosis present

## 2019-06-02 DIAGNOSIS — R579 Shock, unspecified: Secondary | ICD-10-CM | POA: Diagnosis present

## 2019-06-02 DIAGNOSIS — Z66 Do not resuscitate: Secondary | ICD-10-CM | POA: Diagnosis present

## 2019-06-02 DIAGNOSIS — J9601 Acute respiratory failure with hypoxia: Secondary | ICD-10-CM | POA: Diagnosis present

## 2019-06-02 DIAGNOSIS — E860 Dehydration: Principal | ICD-10-CM | POA: Diagnosis present

## 2019-06-02 DIAGNOSIS — B961 Klebsiella pneumoniae [K. pneumoniae] as the cause of diseases classified elsewhere: Secondary | ICD-10-CM | POA: Diagnosis present

## 2019-06-02 DIAGNOSIS — J9811 Atelectasis: Secondary | ICD-10-CM | POA: Diagnosis present

## 2019-06-02 DIAGNOSIS — Z9981 Dependence on supplemental oxygen: Secondary | ICD-10-CM

## 2019-06-02 DIAGNOSIS — J9621 Acute and chronic respiratory failure with hypoxia: Secondary | ICD-10-CM | POA: Diagnosis present

## 2019-06-02 DIAGNOSIS — R55 Syncope and collapse: Secondary | ICD-10-CM

## 2019-06-02 DIAGNOSIS — D509 Iron deficiency anemia, unspecified: Secondary | ICD-10-CM | POA: Diagnosis present

## 2019-06-02 DIAGNOSIS — E7800 Pure hypercholesterolemia, unspecified: Secondary | ICD-10-CM | POA: Diagnosis present

## 2019-06-02 DIAGNOSIS — Z20822 Contact with and (suspected) exposure to covid-19: Secondary | ICD-10-CM | POA: Diagnosis present

## 2019-06-02 DIAGNOSIS — Z79899 Other long term (current) drug therapy: Secondary | ICD-10-CM

## 2019-06-02 DIAGNOSIS — E871 Hypo-osmolality and hyponatremia: Secondary | ICD-10-CM | POA: Diagnosis not present

## 2019-06-02 DIAGNOSIS — I110 Hypertensive heart disease with heart failure: Secondary | ICD-10-CM | POA: Diagnosis present

## 2019-06-02 DIAGNOSIS — J9622 Acute and chronic respiratory failure with hypercapnia: Secondary | ICD-10-CM

## 2019-06-02 DIAGNOSIS — E861 Hypovolemia: Secondary | ICD-10-CM | POA: Diagnosis present

## 2019-06-02 DIAGNOSIS — J449 Chronic obstructive pulmonary disease, unspecified: Secondary | ICD-10-CM | POA: Diagnosis present

## 2019-06-02 DIAGNOSIS — E878 Other disorders of electrolyte and fluid balance, not elsewhere classified: Secondary | ICD-10-CM | POA: Diagnosis present

## 2019-06-02 DIAGNOSIS — N390 Urinary tract infection, site not specified: Secondary | ICD-10-CM | POA: Diagnosis present

## 2019-06-02 DIAGNOSIS — I440 Atrioventricular block, first degree: Secondary | ICD-10-CM | POA: Diagnosis present

## 2019-06-02 DIAGNOSIS — I4510 Unspecified right bundle-branch block: Secondary | ICD-10-CM | POA: Diagnosis present

## 2019-06-02 DIAGNOSIS — R531 Weakness: Secondary | ICD-10-CM

## 2019-06-02 DIAGNOSIS — I959 Hypotension, unspecified: Secondary | ICD-10-CM

## 2019-06-02 DIAGNOSIS — H109 Unspecified conjunctivitis: Secondary | ICD-10-CM | POA: Diagnosis present

## 2019-06-02 LAB — CBC WITH DIFFERENTIAL/PLATELET
Abs Immature Granulocytes: 0.18 10*3/uL — ABNORMAL HIGH (ref 0.00–0.07)
Basophils Absolute: 0 10*3/uL (ref 0.0–0.1)
Basophils Relative: 0 %
Eosinophils Absolute: 0 10*3/uL (ref 0.0–0.5)
Eosinophils Relative: 0 %
HCT: 25.9 % — ABNORMAL LOW (ref 39.0–52.0)
Hemoglobin: 8.2 g/dL — ABNORMAL LOW (ref 13.0–17.0)
Immature Granulocytes: 1 %
Lymphocytes Relative: 6 %
Lymphs Abs: 0.8 10*3/uL (ref 0.7–4.0)
MCH: 28.2 pg (ref 26.0–34.0)
MCHC: 31.7 g/dL (ref 30.0–36.0)
MCV: 89 fL (ref 80.0–100.0)
Monocytes Absolute: 1.1 10*3/uL — ABNORMAL HIGH (ref 0.1–1.0)
Monocytes Relative: 9 %
Neutro Abs: 10.3 10*3/uL — ABNORMAL HIGH (ref 1.7–7.7)
Neutrophils Relative %: 84 %
Platelets: 233 10*3/uL (ref 150–400)
RBC: 2.91 MIL/uL — ABNORMAL LOW (ref 4.22–5.81)
RDW: 19.8 % — ABNORMAL HIGH (ref 11.5–15.5)
WBC: 12.4 10*3/uL — ABNORMAL HIGH (ref 4.0–10.5)
nRBC: 0 % (ref 0.0–0.2)

## 2019-06-02 LAB — URINALYSIS, COMPLETE (UACMP) WITH MICROSCOPIC
Bilirubin Urine: NEGATIVE
Glucose, UA: NEGATIVE mg/dL
Ketones, ur: NEGATIVE mg/dL
Nitrite: NEGATIVE
Protein, ur: NEGATIVE mg/dL
Specific Gravity, Urine: 1.009 (ref 1.005–1.030)
Squamous Epithelial / HPF: NONE SEEN (ref 0–5)
pH: 5 (ref 5.0–8.0)

## 2019-06-02 LAB — COMPREHENSIVE METABOLIC PANEL
ALT: 23 U/L (ref 0–44)
AST: 33 U/L (ref 15–41)
Albumin: 3.8 g/dL (ref 3.5–5.0)
Alkaline Phosphatase: 55 U/L (ref 38–126)
Anion gap: 10 (ref 5–15)
BUN: 28 mg/dL — ABNORMAL HIGH (ref 8–23)
CO2: 33 mmol/L — ABNORMAL HIGH (ref 22–32)
Calcium: 8.7 mg/dL — ABNORMAL LOW (ref 8.9–10.3)
Chloride: 85 mmol/L — ABNORMAL LOW (ref 98–111)
Creatinine, Ser: 1.07 mg/dL (ref 0.61–1.24)
GFR calc Af Amer: >60 mL/min (ref >60)
GFR calc non Af Amer: >60 mL/min (ref >60)
Glucose, Bld: 204 mg/dL — ABNORMAL HIGH (ref 70–99)
Potassium: 5.1 mmol/L (ref 3.5–5.1)
Sodium: 128 mmol/L — ABNORMAL LOW (ref 135–145)
Total Bilirubin: 0.6 mg/dL (ref 0.3–1.2)
Total Protein: 6.7 g/dL (ref 6.5–8.1)

## 2019-06-02 LAB — GLUCOSE, CAPILLARY: Glucose-Capillary: 181 mg/dL — ABNORMAL HIGH (ref 70–99)

## 2019-06-02 NOTE — ED Notes (Signed)
2300 patient was noted to be less responsive and harder to arouse than when he arrived. CBG shows 181, BP retaken and patient is profoundly hypotensive at 81/37. Immediately started fluids at EDP approval.

## 2019-06-02 NOTE — ED Triage Notes (Signed)
Patient arrives via EMS from [facility] where he lives independently. Patient called out for having a fall, unknown how long he had been lying on floor. EMS reports foul urine smell. Patient seems slightly confused. Patient also endorses fall on Wednesday of this week. Unsure cause of fall, denies tripping on something

## 2019-06-03 ENCOUNTER — Emergency Department: Payer: Medicare Other

## 2019-06-03 ENCOUNTER — Inpatient Hospital Stay: Payer: Medicare Other

## 2019-06-03 DIAGNOSIS — Z9981 Dependence on supplemental oxygen: Secondary | ICD-10-CM | POA: Diagnosis not present

## 2019-06-03 DIAGNOSIS — J9621 Acute and chronic respiratory failure with hypoxia: Secondary | ICD-10-CM

## 2019-06-03 DIAGNOSIS — E871 Hypo-osmolality and hyponatremia: Secondary | ICD-10-CM

## 2019-06-03 DIAGNOSIS — D509 Iron deficiency anemia, unspecified: Secondary | ICD-10-CM | POA: Diagnosis present

## 2019-06-03 DIAGNOSIS — J9601 Acute respiratory failure with hypoxia: Secondary | ICD-10-CM

## 2019-06-03 DIAGNOSIS — R531 Weakness: Secondary | ICD-10-CM | POA: Diagnosis not present

## 2019-06-03 DIAGNOSIS — Z20822 Contact with and (suspected) exposure to covid-19: Secondary | ICD-10-CM | POA: Diagnosis present

## 2019-06-03 DIAGNOSIS — B961 Klebsiella pneumoniae [K. pneumoniae] as the cause of diseases classified elsewhere: Secondary | ICD-10-CM | POA: Diagnosis present

## 2019-06-03 DIAGNOSIS — E785 Hyperlipidemia, unspecified: Secondary | ICD-10-CM | POA: Diagnosis present

## 2019-06-03 DIAGNOSIS — J9602 Acute respiratory failure with hypercapnia: Secondary | ICD-10-CM

## 2019-06-03 DIAGNOSIS — I5032 Chronic diastolic (congestive) heart failure: Secondary | ICD-10-CM | POA: Diagnosis present

## 2019-06-03 DIAGNOSIS — I959 Hypotension, unspecified: Secondary | ICD-10-CM | POA: Diagnosis not present

## 2019-06-03 DIAGNOSIS — I440 Atrioventricular block, first degree: Secondary | ICD-10-CM | POA: Diagnosis present

## 2019-06-03 DIAGNOSIS — I4510 Unspecified right bundle-branch block: Secondary | ICD-10-CM | POA: Diagnosis present

## 2019-06-03 DIAGNOSIS — E861 Hypovolemia: Secondary | ICD-10-CM | POA: Diagnosis present

## 2019-06-03 DIAGNOSIS — Z66 Do not resuscitate: Secondary | ICD-10-CM | POA: Diagnosis present

## 2019-06-03 DIAGNOSIS — J9622 Acute and chronic respiratory failure with hypercapnia: Secondary | ICD-10-CM

## 2019-06-03 DIAGNOSIS — H109 Unspecified conjunctivitis: Secondary | ICD-10-CM | POA: Diagnosis present

## 2019-06-03 DIAGNOSIS — R55 Syncope and collapse: Secondary | ICD-10-CM

## 2019-06-03 DIAGNOSIS — I110 Hypertensive heart disease with heart failure: Secondary | ICD-10-CM | POA: Diagnosis present

## 2019-06-03 DIAGNOSIS — E860 Dehydration: Secondary | ICD-10-CM | POA: Diagnosis present

## 2019-06-03 DIAGNOSIS — I951 Orthostatic hypotension: Secondary | ICD-10-CM

## 2019-06-03 DIAGNOSIS — W19XXXA Unspecified fall, initial encounter: Secondary | ICD-10-CM | POA: Diagnosis present

## 2019-06-03 DIAGNOSIS — E7800 Pure hypercholesterolemia, unspecified: Secondary | ICD-10-CM | POA: Diagnosis present

## 2019-06-03 DIAGNOSIS — E878 Other disorders of electrolyte and fluid balance, not elsewhere classified: Secondary | ICD-10-CM | POA: Diagnosis present

## 2019-06-03 DIAGNOSIS — R579 Shock, unspecified: Secondary | ICD-10-CM | POA: Diagnosis present

## 2019-06-03 DIAGNOSIS — J9811 Atelectasis: Secondary | ICD-10-CM | POA: Diagnosis present

## 2019-06-03 DIAGNOSIS — N400 Enlarged prostate without lower urinary tract symptoms: Secondary | ICD-10-CM | POA: Diagnosis present

## 2019-06-03 DIAGNOSIS — F17210 Nicotine dependence, cigarettes, uncomplicated: Secondary | ICD-10-CM | POA: Diagnosis present

## 2019-06-03 DIAGNOSIS — J449 Chronic obstructive pulmonary disease, unspecified: Secondary | ICD-10-CM | POA: Diagnosis present

## 2019-06-03 DIAGNOSIS — N390 Urinary tract infection, site not specified: Secondary | ICD-10-CM | POA: Diagnosis present

## 2019-06-03 LAB — BASIC METABOLIC PANEL
Anion gap: 7 (ref 5–15)
BUN: 24 mg/dL — ABNORMAL HIGH (ref 8–23)
CO2: 33 mmol/L — ABNORMAL HIGH (ref 22–32)
Calcium: 8.3 mg/dL — ABNORMAL LOW (ref 8.9–10.3)
Chloride: 91 mmol/L — ABNORMAL LOW (ref 98–111)
Creatinine, Ser: 0.87 mg/dL (ref 0.61–1.24)
GFR calc Af Amer: >60 mL/min (ref >60)
GFR calc non Af Amer: >60 mL/min (ref >60)
Glucose, Bld: 104 mg/dL — ABNORMAL HIGH (ref 70–99)
Potassium: 4.4 mmol/L (ref 3.5–5.1)
Sodium: 131 mmol/L — ABNORMAL LOW (ref 135–145)

## 2019-06-03 LAB — BLOOD GAS, ARTERIAL
Acid-Base Excess: 10.7 mmol/L — ABNORMAL HIGH (ref 0.0–2.0)
Bicarbonate: 38.5 mmol/L — ABNORMAL HIGH (ref 20.0–28.0)
Delivery systems: POSITIVE
Expiratory PAP: 6
FIO2: 0.28
Inspiratory PAP: 12
O2 Saturation: 90.5 %
Patient temperature: 37
pCO2 arterial: 65 mmHg — ABNORMAL HIGH (ref 32.0–48.0)
pH, Arterial: 7.38 (ref 7.350–7.450)
pO2, Arterial: 61 mmHg — ABNORMAL LOW (ref 83.0–108.0)

## 2019-06-03 LAB — CBC
HCT: 23.8 % — ABNORMAL LOW (ref 39.0–52.0)
Hemoglobin: 7.3 g/dL — ABNORMAL LOW (ref 13.0–17.0)
MCH: 27.3 pg (ref 26.0–34.0)
MCHC: 30.7 g/dL (ref 30.0–36.0)
MCV: 89.1 fL (ref 80.0–100.0)
Platelets: 216 10*3/uL (ref 150–400)
RBC: 2.67 MIL/uL — ABNORMAL LOW (ref 4.22–5.81)
RDW: 19.8 % — ABNORMAL HIGH (ref 11.5–15.5)
WBC: 10.7 10*3/uL — ABNORMAL HIGH (ref 4.0–10.5)
nRBC: 0 % (ref 0.0–0.2)

## 2019-06-03 LAB — BLOOD GAS, VENOUS
Acid-Base Excess: 8.4 mmol/L — ABNORMAL HIGH (ref 0.0–2.0)
Bicarbonate: 39 mmol/L — ABNORMAL HIGH (ref 20.0–28.0)
O2 Saturation: 57.2 %
Patient temperature: 37
pCO2, Ven: 87 mmHg (ref 44.0–60.0)
pH, Ven: 7.26 (ref 7.250–7.430)
pO2, Ven: 35 mmHg (ref 32.0–45.0)

## 2019-06-03 LAB — CK: Total CK: 95 U/L (ref 49–397)

## 2019-06-03 LAB — RESPIRATORY PANEL BY RT PCR (FLU A&B, COVID)
Influenza A by PCR: NEGATIVE
Influenza B by PCR: NEGATIVE
SARS Coronavirus 2 by RT PCR: NEGATIVE

## 2019-06-03 LAB — TSH: TSH: 0.58 u[IU]/mL (ref 0.350–4.500)

## 2019-06-03 LAB — PROCALCITONIN: Procalcitonin: <0.1 ng/mL

## 2019-06-03 LAB — CORTISOL: Cortisol, Plasma: 17.3 ug/dL

## 2019-06-03 LAB — MRSA PCR SCREENING: MRSA by PCR: NEGATIVE

## 2019-06-03 LAB — TROPONIN I (HIGH SENSITIVITY): Troponin I (High Sensitivity): 11 ng/L (ref <18)

## 2019-06-03 MED ORDER — ONDANSETRON HCL 4 MG/2ML IJ SOLN: 4.0000 mg | Freq: Four times a day (QID) | INTRAMUSCULAR | Status: DC | PRN

## 2019-06-03 MED ORDER — ORAL CARE MOUTH RINSE
15.0000 mL | Freq: Two times a day (BID) | OROMUCOSAL | Status: DC
  Administered 2019-06-03 – 2019-06-06 (×7): 15 mL via OROMUCOSAL

## 2019-06-03 MED ORDER — SODIUM CHLORIDE 0.9 % IV SOLN: INTRAVENOUS | Status: DC

## 2019-06-03 MED ORDER — ACETAMINOPHEN 325 MG PO TABS: 650.0000 mg | ORAL_TABLET | Freq: Four times a day (QID) | ORAL | Status: DC | PRN

## 2019-06-03 MED ORDER — SODIUM CHLORIDE 0.9 % IV SOLN: 250.0000 mL | INTRAVENOUS | Status: DC

## 2019-06-03 MED ORDER — SODIUM CHLORIDE 0.9 % IV BOLUS
1000.0000 mL | Freq: Once | INTRAVENOUS | Status: AC
  Administered 2019-06-03: 1000 mL via INTRAVENOUS

## 2019-06-03 MED ORDER — MONTELUKAST SODIUM 10 MG PO TABS
10.0000 mg | ORAL_TABLET | Freq: Every day | ORAL | Status: DC
  Administered 2019-06-03 – 2019-06-06 (×4): 10 mg via ORAL
  Filled 2019-06-03 (×5): qty 1

## 2019-06-03 MED ORDER — MOMETASONE FURO-FORMOTEROL FUM 100-5 MCG/ACT IN AERO
2.0000 | INHALATION_SPRAY | Freq: Two times a day (BID) | RESPIRATORY_TRACT | Status: DC
  Administered 2019-06-03 – 2019-06-06 (×7): 2 via RESPIRATORY_TRACT
  Filled 2019-06-03 (×2): qty 8.8

## 2019-06-03 MED ORDER — FLUTICASONE-SALMETEROL 100-50 MCG/DOSE IN AEPB: 1.0000 | INHALATION_SPRAY | Freq: Two times a day (BID) | RESPIRATORY_TRACT | Status: DC

## 2019-06-03 MED ORDER — IPRATROPIUM-ALBUTEROL 0.5-2.5 (3) MG/3ML IN SOLN
3.0000 mL | Freq: Four times a day (QID) | RESPIRATORY_TRACT | Status: DC
  Administered 2019-06-03 – 2019-06-06 (×12): 3 mL via RESPIRATORY_TRACT
  Filled 2019-06-03 (×14): qty 3

## 2019-06-03 MED ORDER — ACETAMINOPHEN 650 MG RE SUPP: 650.0000 mg | Freq: Four times a day (QID) | RECTAL | Status: DC | PRN

## 2019-06-03 MED ORDER — SODIUM CHLORIDE 0.9 % IV SOLN
1.0000 g | INTRAVENOUS | Status: DC
  Administered 2019-06-03 – 2019-06-05 (×3): 1 g via INTRAVENOUS
  Filled 2019-06-03: qty 10
  Filled 2019-06-03 (×3): qty 1

## 2019-06-03 MED ORDER — PANTOPRAZOLE SODIUM 40 MG PO TBEC
40.0000 mg | DELAYED_RELEASE_TABLET | Freq: Two times a day (BID) | ORAL | Status: DC
  Administered 2019-06-03 – 2019-06-06 (×7): 40 mg via ORAL
  Filled 2019-06-03 (×7): qty 1

## 2019-06-03 MED ORDER — PRAVASTATIN SODIUM 20 MG PO TABS
40.0000 mg | ORAL_TABLET | Freq: Every day | ORAL | Status: DC
  Administered 2019-06-03 – 2019-06-05 (×3): 40 mg via ORAL
  Filled 2019-06-03 (×2): qty 2
  Filled 2019-06-03: qty 1
  Filled 2019-06-03 (×2): qty 2

## 2019-06-03 MED ORDER — TRAZODONE HCL 50 MG PO TABS: 25.0000 mg | ORAL_TABLET | Freq: Every evening | ORAL | Status: DC | PRN

## 2019-06-03 MED ORDER — ENSURE ENLIVE PO LIQD
237.0000 mL | Freq: Two times a day (BID) | ORAL | Status: DC
  Administered 2019-06-03 – 2019-06-05 (×6): 237 mL via ORAL

## 2019-06-03 MED ORDER — ONDANSETRON HCL 4 MG PO TABS: 4.0000 mg | ORAL_TABLET | Freq: Four times a day (QID) | ORAL | Status: DC | PRN

## 2019-06-03 MED ORDER — POTASSIUM CHLORIDE ER 20 MEQ PO TBCR: 20.0000 meq | EXTENDED_RELEASE_TABLET | Freq: Two times a day (BID) | ORAL | Status: DC

## 2019-06-03 MED ORDER — CHLORHEXIDINE GLUCONATE CLOTH 2 % EX PADS
6.0000 | MEDICATED_PAD | Freq: Every day | CUTANEOUS | Status: DC
  Administered 2019-06-03: 6 via TOPICAL

## 2019-06-03 MED ORDER — FERROUS SULFATE 325 (65 FE) MG PO TABS
325.0000 mg | ORAL_TABLET | Freq: Two times a day (BID) | ORAL | Status: DC
  Administered 2019-06-03 – 2019-06-06 (×8): 325 mg via ORAL
  Filled 2019-06-03 (×8): qty 1

## 2019-06-03 MED ORDER — TAMSULOSIN HCL 0.4 MG PO CAPS
0.4000 mg | ORAL_CAPSULE | Freq: Every day | ORAL | Status: DC
  Administered 2019-06-03 – 2019-06-06 (×4): 0.4 mg via ORAL
  Filled 2019-06-03 (×4): qty 1

## 2019-06-03 MED ORDER — NOREPINEPHRINE 4 MG/250ML-% IV SOLN: 2.0000 ug/min | INTRAVENOUS | Status: DC

## 2019-06-03 MED ORDER — CARVEDILOL 6.25 MG PO TABS: 6.2500 mg | ORAL_TABLET | Freq: Two times a day (BID) | ORAL | Status: DC

## 2019-06-03 MED ORDER — AMLODIPINE BESYLATE 5 MG PO TABS: 10.0000 mg | ORAL_TABLET | Freq: Every day | ORAL | Status: DC

## 2019-06-03 MED ORDER — ENOXAPARIN SODIUM 40 MG/0.4ML ~~LOC~~ SOLN
40.0000 mg | SUBCUTANEOUS | Status: DC
  Administered 2019-06-03 – 2019-06-06 (×4): 40 mg via SUBCUTANEOUS
  Filled 2019-06-03 (×3): qty 0.4

## 2019-06-03 MED ORDER — MAGNESIUM HYDROXIDE 400 MG/5ML PO SUSP: 30.0000 mL | Freq: Every day | ORAL | Status: DC | PRN

## 2019-06-03 NOTE — ED Notes (Signed)
Patient is more alert at this time, informed him that he must keep BiPAP on his face in order to breathe better. Patient agrees

## 2019-06-03 NOTE — Progress Notes (Signed)
No charge progress note.  Douglas Mathis  is a 84 y.o. African-American male with a known history of hypertension, dyslipidemia, COPD, chronic respiratory failure on home O2 at 3 L/min, CHF and BPH, who presented to the emergency room with acute onset of altered mental status and generalized weakness with low blood pressure and recent syncope twice.  He denied any chest pain or palpitations.  Patient was having dyspnea with associated mild cough and wheezing in the ER.  No fever or chills.  No nausea vomiting or abdominal pain or diarrhea.  No melena or bright red bleeding per rectum or other bleeding diathesis.  He denied dysuria, oliguria or hematuria or flank pain.  Upon presentation to the emergency room, vital signs were within normal and later blood pressure was 89/38 with a temperature of 96.9 respiratory to 21.  Blood pressures improved to 115/42 and later was 99/43 with a pulse of 62.  Labs revealed a venous blood gas with pH 7.26, PCO2 of 87 and PO2 35 with bicarbonate of 39.  Influenza antigens and COVID-19 PCR came back negative.  CMP was remarkable for hyponatremia and hypochloremia with a potassium of 5.1 blood glucose of 204 and BUN of 28 with creatinine of 1.07.  CK was 95 and CBC showed leukocytosis of 12.4 with anemia and urinalysis showed 6-10 WBCs with 11-20 RBCs and hyaline casts and rare bacteria.  Urine culture was sent.  Noncontrasted head CT scan revealed no acute intracranial normalities.  It showed atrophy and mild chronic small vessel ischemic changes of the white matter.  Chest x-ray showed chronic appearing increased interstitial lung markings with mild stable left basal atelectasis.   EKG showed sinus rhythm with rate of 70 with first-degree AV block, left axis deviation and right bundle branch block.  The patient was given 1 L bolus of IV normal saline followed by 125 mL/h.  He was placed on BiPAP for his respiratory failure. Patient was able to wean off from BiPAP.   Saturating well on his home oxygen of 3 L.  He appears drowsy when seen today, per Dr. Belia Heman he was communicating well earlier.  Blood culture negative so far.  Hyponatremia improving sodium of 131.  Procalcitonin remain negative.  He will continue with current management.

## 2019-06-03 NOTE — ED Notes (Signed)
Respiratory therapy is coming to place patient on BiPAP

## 2019-06-03 NOTE — ED Notes (Signed)
Patient transported to CT 

## 2019-06-03 NOTE — ED Notes (Signed)
Called respiratory to get ABG on patient

## 2019-06-03 NOTE — ED Notes (Signed)
Messaged MD regarding concerns that patient is becoming more confused again, asked if another ABG or VBG would be helpful. Patient was repositioned by this RN and the patient was educated on why the BiPAP is important and how he must not pull it off

## 2019-06-03 NOTE — Consult Note (Signed)
Name: Douglas Mathis MRN: [id number] DOB: 28-Dec-1935    ADMISSION DATE:  06/02/2019 CONSULTATION DATE:  06/03/2019  REFERRING MD :  Dr. Arville Care  CHIEF COMPLAINT:  Fall, weakness  BRIEF PATIENT DESCRIPTION:  84 year old male, DNR/DNI, admitted 06/02/2019 with acute on chronic hypoxic hypercapnic respiratory failure requiring BiPAP, syncope, orthostatic hypotension.  While awaiting bed placement in the ED he again became hypotensive concerning for hypovolemic shock with possible superimposed developing septic shock secondary to UTI.  SIGNIFICANT EVENTS  4/9: Presented to ED, to be admitted to [facility] 4/10: Hypotensive, PCCM consulted for possible need for vasopressors  STUDIES:  4/9: CT Head>>1. No CT evidence for acute intracranial abnormality. 2. Atrophy and mild chronic small vessel ischemic change of the white matter 4/10: Echocardiogram>>  CULTURES: SARS-CoV-2 PCR 4/9>> negative Influenza PCR 4/9>> Urine 4/9>> Blood x2 4/10>>  ANTIBIOTICS: Ceftriaxone 4/9>>  HISTORY OF PRESENT ILLNESS:   Douglas Mathis is a 84 year old male with a past medical history significant for COPD on 3 L home O2, HFpEF, anemia, hypertension, hyperlipidemia who [facility] on 06/03/2019 due to generalized weakness and fall.  He reported he has been feeling weak for over the last week, and is status post fall couple days ago as well as earlier today.  He is unsure of how long he was on the floor.He denied chest pain, shortness of breath, cough, sputum production, abdominal pain, nausea, vomiting, diarrhea neck pain, back pain.   Upon presentation to the ED he was noted to be somnolent and slightly confused, however arousable and able to provide history.  He was found to be hypotensive with blood pressure 89/38, and hypothermic with temperature 96.9.  He appeared dehydrated and was placed on IV fluids with improvement of blood pressure and mental status.  VBG was done showing hypercapnia.  He was  subsequently placed on BiPAP.    Initial work-up in the ED revealed sodium 128, potassium 5.1, bicarb 33, glucose 204, WBC 12.4 with neutrophilia, hemoglobin 8.2.  Urinalysis is concerning for possible UTI.  EKG with first-degree AV block and right bundle branch block.  CXR with chronic interstitial lung markings, and left basilar atelectasis.    He was to be admitted to the [facility] unit by the Hospitalist for further workup and treatment of Acute on Chronic Hypoxic hypercapnic respiratory failure, syncope, mild hyponatremia, and orthostatic hypotension.    While awaiting bed placement the ED he again became hypotensive, with possible need for vasopressors.  PCCM is consulted for further work-up and treatment of hypotension.  Of note, upon arrival to ICU, he is currently normotensive, and he has been weaned off BiPAP, and remains awake, A&O to person and place.  PAST MEDICAL HISTORY :   has a past medical history of Anemia, BPH (benign prostatic hyperplasia), CHF (congestive heart failure) (HCC), COPD (chronic obstructive pulmonary disease) (HCC), Hypercholesterolemia, and Hypertension.  has a past surgical history that includes Esophagogastroduodenoscopy (N/A, 07/10/2015); cataracts; and Esophagogastroduodenoscopy (N/A, 01/18/2018). Prior to Admission medications   Medication Sig Start Date End Date Taking? Authorizing Provider  amLODipine (NORVASC) 10 MG tablet Take 1 tablet (10 mg total) by mouth daily for 30 days. 03/23/18 04/22/18  Ihor Austin, MD  amoxicillin-clavulanate (AUGMENTIN) 875-125 MG tablet Take 1 tablet by mouth 2 (two) times daily. 04/01/19   Sharman Cheek, MD  carvedilol (COREG) 6.25 MG tablet Take 1 tablet (6.25 mg total) by mouth 2 (two) times daily. 03/22/18 03/22/19  Ihor Austin, MD  DOK 100 MG capsule Take 100 mg  by mouth 2 (two) times daily. 09/28/17   [provider]  feeding supplement, ENSURE ENLIVE, (ENSURE ENLIVE) LIQD Take 237 mLs by mouth 2 (two) times  daily between meals. 03/22/18   Ihor Austin, MD  ferrous sulfate 325 (65 FE) MG tablet Take 1 tablet (325 mg total) by mouth 2 (two) times daily with a meal. 07/22/15   Renae Gloss, Richard, MD  Fluticasone-Salmeterol (ADVAIR DISKUS IN) Inhale 1 puff into the lungs 2 (two) times daily.    [provider]  Fluticasone-Umeclidin-Vilant 100-62.5-25 MCG/INH AEPB Inhale 1 puff into the lungs daily. 12/13/17   [provider]  furosemide (LASIX) 40 MG tablet Take 1 tablet (40 mg total) by mouth 2 (two) times daily for 30 days. 03/22/18 04/21/18  Ihor Austin, MD  GNP VITAMIN C 500 MG tablet Take 500 mg by mouth 2 (two) times daily. 09/22/17   [provider]  ipratropium-albuterol (DUONEB) 0.5-2.5 (3) MG/3ML SOLN Take 3 mLs by nebulization every 6 (six) hours as needed. 10/27/17   Salary, Evelena Asa, MD  lisinopril (PRINIVIL,ZESTRIL) 5 MG tablet Take 1 tablet (5 mg total) by mouth daily for 30 days. 03/22/18 04/21/18  Ihor Austin, MD  lovastatin (MEVACOR) 40 MG tablet Take 40 mg by mouth at bedtime. 09/23/17   [provider]  montelukast (SINGULAIR) 10 MG tablet Take 10 mg by mouth daily. 10/22/17   [provider]  pantoprazole (PROTONIX) 40 MG tablet Take 40 mg by mouth 2 (two) times daily. 12/08/17   [provider]  potassium chloride 20 MEQ TBCR Take 20 mEq by mouth 2 (two) times daily for 30 days. 03/22/18 04/21/18  Ihor Austin, MD  predniSONE (DELTASONE) 20 MG tablet Take 2 tablets (40 mg total) by mouth daily. 04/01/19   Sharman Cheek, MD  PROAIR HFA 108 [phone] Base) MCG/ACT inhaler Inhale 2 puffs into the lungs 4 (four) times daily as needed for wheezing or shortness of breath.  08/04/17   [provider]  tamsulosin (FLOMAX) 0.4 MG CAPS capsule Take 0.4 mg by mouth daily after breakfast.     [provider]   No Known Allergies  FAMILY HISTORY:  family history includes Bone cancer in his brother; Bronchitis in his father; CAD in  his mother; Diabetes in his mother. SOCIAL HISTORY:  reports that he has been smoking. He has been smoking about 0.50 packs per day. He has never used smokeless tobacco. He reports previous alcohol use. He reports that he does not use drugs.   COVID-19 DISASTER DECLARATION:  FULL CONTACT PHYSICAL EXAMINATION WAS NOT POSSIBLE DUE TO TREATMENT OF COVID-19 AND  CONSERVATION OF PERSONAL PROTECTIVE EQUIPMENT, LIMITED EXAM FINDINGS INCLUDE-  Patient assessed or the symptoms described in the history of present illness.  In the context of the Global COVID-19 pandemic, which necessitated consideration that the patient might be at risk for infection with the SARS-CoV-2 virus that causes COVID-19, Institutional protocols and algorithms that pertain to the evaluation of patients at risk for COVID-19 are in a state of rapid change based on information released by regulatory bodies including the CDC and federal and state organizations. These policies and algorithms were followed during the patient's care while in hospital.  REVIEW OF SYSTEMS:  Positives in BOLD Constitutional: Negative for fever, chills, weight loss, malaise/fatigue and diaphoresis.  HENT: Negative for hearing loss, ear pain, nosebleeds, congestion, sore throat, neck pain, tinnitus and ear discharge.   Eyes: Negative for blurred vision, double vision, photophobia, pain, discharge and redness.  Respiratory: Negative for +cough, hemoptysis, sputum production, shortness of breath, wheezing and stridor.   Cardiovascular: Negative for chest pain, palpitations, orthopnea, claudication, leg swelling and PND.  Gastrointestinal: Negative for heartburn, nausea, vomiting, abdominal pain, diarrhea, constipation, blood in stool and melena.  Genitourinary: Negative for dysuria, urgency, frequency, hematuria and flank pain.  Musculoskeletal: Negative for myalgias, back pain, joint pain and falls.  Skin: Negative for itching and rash.  Neurological:  Negative for dizziness, tingling, tremors, sensory change, speech change, focal weakness, seizures, loss of consciousness, weakness and headaches.  Endo/Heme/Allergies: Negative for environmental allergies and polydipsia. Does not bruise/bleed easily.  SUBJECTIVE:  Pt reports intermittent cough productive of white sputum Denies dizziness, chest pain, palpitations, shortness of breath, wheezing, abdominal pain, N/V/D, fever/chills Weaned off BiPAP to [place]  VITAL SIGNS: Temp:  [96.9 F (36.1 C)-98.5 F (36.9 C)] 96.9 F (36.1 C) (04/10 0056) Pulse Rate:  [58-78] 58 (04/10 0500) Resp:  [14-23] 14 (04/10 0500) BP: (89-116)/(33-67) 106/53 (04/10 0500) SpO2:  [92 %-100 %] 94 % (04/10 0500) FiO2 (%):  [0.3 %] 0.3 % (04/10 0047) Weight:  [72 kg] 72 kg (04/09 2234)  PHYSICAL EXAMINATION: General: Acute on chronically ill-appearing male, laying in bed, on nasal cannula, no acute distress Neuro: Awake, oriented to person and place, follows commands, no focal deficits, pupils PERRLA HEENT: Atraumatic, normocephalic, neck supple, no JVD Cardiovascular: Regular rate and rhythm, S1-S2, no murmurs, rubs, gallops Lungs: Coarse breath sounds bilaterally, no wheezing or rhonchi, even, nonlabored, no accessory muscle use Abdomen: Soft, nontender, nondistended, no guarding or rebound tenderness, bowel sounds positive x4 Musculoskeletal: Generalized weakness, no deformities, no edema Skin: Warm and dry, no obvious rashes, lesions, or ulcerations  Recent Labs  Lab 06/02/19 2237  NA 128*  K 5.1  CL 85*  CO2 33*  BUN 28*  CREATININE 1.07  GLUCOSE 204*   Recent Labs  Lab 06/02/19 2237  HGB 8.2*  HCT 25.9*  WBC 12.4*  PLT 233   CT Head Wo Contrast  Result Date: 06/03/2019 CLINICAL DATA:  Encephalopathy fall EXAM: CT HEAD WITHOUT CONTRAST TECHNIQUE: Contiguous axial images were obtained from the base of the skull through the vertex without intravenous contrast. COMPARISON:  CT brain 06/10/2016  FINDINGS: Brain: No acute territorial infarction, hemorrhage, or intracranial mass. Moderate atrophy. Minimal hypodensity in the periventricular white matter likely chronic small vessel ischemic change. Stable ventricle size Vascular: No hyperdense vessels.  Carotid vascular calcification Skull: Normal. Negative for fracture or focal lesion. Sinuses/Orbits: Mucosal thickening in the ethmoid sinuses Other: None IMPRESSION: 1. No CT evidence for acute intracranial abnormality. 2. Atrophy and mild chronic small vessel ischemic change of the white matter Electronically Signed   By: Donavan Foil M.D.   On: 06/03/2019 00:35   DG Chest Portable 1 View  Result Date: 06/02/2019 CLINICAL DATA:  Status post fall. EXAM: PORTABLE CHEST 1 VIEW COMPARISON:  April 01, 2019 FINDINGS: Mild to moderate severity diffuse chronic appearing increased interstitial lung markings are seen. Mild, stable atelectasis is seen along the lateral aspect of the left lung base. There is no evidence of a pleural effusion or pneumothorax. The heart size and mediastinal contours are within normal limits. There is marked severity calcification of the aortic arch. Multilevel degenerative changes are noted throughout the thoracic spine. IMPRESSION: Chronic-appearing increased interstitial lung markings with mild, stable left basilar atelectasis. Electronically Signed   By: Virgina Norfolk M.D.   On: 06/02/2019 23:46    ASSESSMENT / PLAN:  Acute on Chronic  Hypoxic Hypercapnic Respiratory Failure Hx: COPD on 3L [place] -Supplemental O2 as needed to maintain O2 sats 88 to 92% -BiPAP, wean as tolerated -Pt is DNR/DNI -Follow intermittent CXR & ABG as needed -Prn Bronchodilators -CXR with chronic disease and left basilar atelectasis  Hypotension, likely Hypovolemic shock +/- developing septic shock from UTI Hx: HTN -Continuous cardiac monitoring -Maintain MAP >65 -IV fluids -Levophed as needed to maintain MAP goal -Check serum  cortisol -Sepsis workup -Check troponin -2D Echocardiogram pending  Hyponatremia, likely Hypovolemic -Monitor I&O's / urinary output -Follow BMP -Ensure adequate renal perfusion -Avoid nephrotoxic agents as able -Replace electrolytes as indicated -IV fluids  Chronic Anemia -Monitor for S/Sx of bleeding -Trend CBC -SQ Lovenox for VTE Prophylaxis  -Transfuse for Hgb <7      BEST PRACTICES: DISPOSITION: ICU GOALS OF CARE: DNR/DNI (pt confirms) VTE PROPHYLAXIS: SQ Lovenox UPDATES: Updated pt at bedside 06/03/19. No family at bedside for update during NP rounds 06/03/19   Harlon Ditty, [facility] Pager: [phone]  06/03/2019, 5:18 AM

## 2019-06-03 NOTE — ED Notes (Signed)
ED TO INPATIENT HANDOFF REPORT  ED Nurse Name and Phone #: [place] Levels Name/Age/Gender Douglas Mathis 84 y.o. male Room/Bed: ED02A/ED02A  Code Status   Code Status: DNR  Home/SNF/Other Skilled nursing facility Patient oriented to: self Is this baseline? No   Triage Complete: Triage complete  Chief Complaint Acute respiratory failure with hypoxia and hypercarbia (HCC) [J96.01, J96.02] Shock ([place]) [R57.9]  Triage Note Patient arrives via EMS from [facility] where he lives independently. Patient called out for having a fall, unknown how long he had been lying on floor. EMS reports foul urine smell. Patient seems slightly confused. Patient also endorses fall on Wednesday of this week. Unsure cause of fall, denies tripping on something    Allergies No Known Allergies  Level of Care/Admitting Diagnosis ED Disposition    ED Disposition Condition [facility] Area: [place] [[id number]]  Level of Care: ICU [6]  Covid Evaluation: Asymptomatic Screening Protocol (No Symptoms)  Diagnosis: Shock [facility]) [[id number]]  Admitting Physician: Christel Mormon [[id number]]  Attending Physician: Christel Mormon [[id number]]  Estimated length of stay: past midnight tomorrow  Certification:: I certify this patient will need inpatient services for at least 2 midnights       B Medical/Surgery History Past Medical History:  Diagnosis Date  . Anemia   . BPH (benign prostatic hyperplasia)   . CHF (congestive heart failure) ([place])   . COPD (chronic obstructive pulmonary disease) ([place])   . Hypercholesterolemia   . Hypertension    Past Surgical History:  Procedure Laterality Date  . cataracts    . ESOPHAGOGASTRODUODENOSCOPY N/A 07/10/2015   Procedure: ESOPHAGOGASTRODUODENOSCOPY (EGD);  Surgeon: Manya Silvas, MD;  Location: [facility] ENDOSCOPY;  Service: Endoscopy;  Laterality: N/A;  . ESOPHAGOGASTRODUODENOSCOPY N/A 01/18/2018   Procedure: ESOPHAGOGASTRODUODENOSCOPY  (EGD);  Surgeon: Toledo, Benay Pike, MD;  Location: [facility] ENDOSCOPY;  Service: Gastroenterology;  Laterality: N/A;     A IV Location/Drains/Wounds Patient Lines/Drains/Airways Status   Active Line/Drains/Airways    Name:   Placement date:   Placement time:   Site:   Days:   Peripheral IV 06/03/19 Right Forearm   06/03/19    --    Forearm   less than 1          Intake/Output Last 24 hours  Intake/Output Summary (Last 24 hours) at 06/03/2019 0513 Last data filed at 06/03/2019 0222 Gross per 24 hour  Intake --  Output 300 ml  Net -300 ml    Labs/Imaging Results for orders placed or performed during the hospital encounter of 06/02/19 (from the past 48 hour(s))  CBC with Differential     Status: Abnormal   Collection Time: 06/02/19 10:37 PM  Result Value Ref Range   WBC 12.4 (H) 4.0 - 10.5 K/uL   RBC 2.91 (L) 4.22 - 5.81 MIL/uL   Hemoglobin 8.2 (L) 13.0 - 17.0 g/dL   HCT 25.9 (L) 39.0 - 52.0 %   MCV 89.0 80.0 - 100.0 fL   MCH 28.2 26.0 - 34.0 pg   MCHC 31.7 30.0 - 36.0 g/dL   RDW 19.8 (H) 11.5 - 15.5 %   Platelets 233 150 - 400 K/uL   nRBC 0.0 0.0 - 0.2 %   Neutrophils Relative % 84 %   Neutro Abs 10.3 (H) 1.7 - 7.7 K/uL   Lymphocytes Relative 6 %   Lymphs Abs 0.8 0.7 - 4.0 K/uL   Monocytes Relative 9 %   Monocytes Absolute 1.1 (H) 0.1 -  1.0 K/uL   Eosinophils Relative 0 %   Eosinophils Absolute 0.0 0.0 - 0.5 K/uL   Basophils Relative 0 %   Basophils Absolute 0.0 0.0 - 0.1 K/uL   Immature Granulocytes 1 %   Abs Immature Granulocytes 0.18 (H) 0.00 - 0.07 K/uL    Comment: Performed at [facility], [address], [place], [place] [address]  Comprehensive metabolic panel     Status: Abnormal   Collection Time: 06/02/19 10:37 PM  Result Value Ref Range   Sodium 128 (L) 135 - 145 mmol/L   Potassium 5.1 3.5 - 5.1 mmol/L   Chloride 85 (L) 98 - 111 mmol/L   CO2 33 (H) 22 - 32 mmol/L   Glucose, Bld 204 (H) 70 - 99 mg/dL    Comment: Glucose reference range applies  only to samples taken after fasting for at least 8 hours.   BUN 28 (H) 8 - 23 mg/dL   Creatinine, Ser 0.63 0.61 - 1.24 mg/dL   Calcium 8.7 (L) 8.9 - 10.3 mg/dL   Total Protein 6.7 6.5 - 8.1 g/dL   Albumin 3.8 3.5 - 5.0 g/dL   AST 33 15 - 41 U/L   ALT 23 0 - 44 U/L   Alkaline Phosphatase 55 38 - 126 U/L   Total Bilirubin 0.6 0.3 - 1.2 mg/dL   GFR calc non Af Amer >60 >60 mL/min   GFR calc Af Amer >60 >60 mL/min   Anion gap 10 5 - 15    Comment: Performed at [facility], [address] [place], [place] [address]  Urinalysis, Complete w Microscopic     Status: Abnormal   Collection Time: 06/02/19 10:37 PM  Result Value Ref Range   Color, Urine YELLOW (A) YELLOW   APPearance HAZY (A) CLEAR   Specific Gravity, Urine 1.009 1.005 - 1.030   pH 5.0 5.0 - 8.0   Glucose, UA NEGATIVE NEGATIVE mg/dL   Hgb urine dipstick SMALL (A) NEGATIVE   Bilirubin Urine NEGATIVE NEGATIVE   Ketones, ur NEGATIVE NEGATIVE mg/dL   Protein, ur NEGATIVE NEGATIVE mg/dL   Nitrite NEGATIVE NEGATIVE   Leukocytes,Ua MODERATE (A) NEGATIVE   RBC / HPF 11-20 0 - 5 RBC/hpf   WBC, UA 6-10 0 - 5 WBC/hpf   Bacteria, UA RARE (A) NONE SEEN   Squamous Epithelial / LPF NONE SEEN 0 - 5   Mucus PRESENT    Hyaline Casts, UA PRESENT     Comment: Performed at [facility], [address] [place], [place] [address]  [place]     Status: None   Collection Time: 06/02/19 10:37 PM  Result Value Ref Range   Total CK 95 49 - 397 U/L    Comment: Performed at [facility], [address] [place], [place] [address]  Glucose, capillary     Status: Abnormal   Collection Time: 06/02/19 10:54 PM  Result Value Ref Range   Glucose-Capillary 181 (H) 70 - 99 mg/dL    Comment: Glucose reference range applies only to samples taken after fasting for at least 8 hours.  Blood gas, venous     Status: Abnormal   Collection Time: 06/02/19 11:36 PM  Result Value Ref Range   pH, Ven 7.26 7.250 - 7.430   pCO2, Ven 87  (HH) 44.0 - 60.0 mmHg    Comment: CRITICAL RESULT CALLED TO, READ BACK BY AND VERIFIED WITH: DR. Nita Sickle 0015 06/03/19 LKM    pO2, Ven 35.0 32.0 -  45.0 mmHg   Bicarbonate 39.0 (H) 20.0 - 28.0 mmol/L   Acid-Base Excess 8.4 (H) 0.0 - 2.0 mmol/L   O2 Saturation 57.2 %   Patient temperature 37.0    Collection site VEIN    Sample type VENOUS     Comment: Performed at [facility], [address], [place], [place] [address]  Respiratory Panel by RT PCR (Flu A&B, Covid) - Nasopharyngeal Swab     Status: None   Collection Time: 06/02/19 11:37 PM   Specimen: Nasopharyngeal Swab  Result Value Ref Range   SARS Coronavirus 2 by RT PCR NEGATIVE NEGATIVE    Comment: (NOTE) SARS-CoV-2 target nucleic acids are NOT DETECTED. The SARS-CoV-2 RNA is generally detectable in upper respiratoy specimens during the acute phase of infection. The lowest concentration of SARS-CoV-2 viral copies this assay can detect is 131 copies/mL. A negative result does not preclude SARS-Cov-2 infection and should not be used as the sole basis for treatment or other patient management decisions. A negative result may occur with  improper specimen collection/handling, submission of specimen other than nasopharyngeal swab, presence of viral mutation(s) within the areas targeted by this assay, and inadequate number of viral copies (<131 copies/mL). A negative result must be combined with clinical observations, patient history, and epidemiological information. The expected result is Negative. Fact Sheet for Patients:  https://www.moore.com/ Fact Sheet for Healthcare Providers:  https://www.young.biz/ This test is not yet ap proved or cleared by the [place] FDA and  has been authorized for detection and/or diagnosis of SARS-CoV-2 by FDA under an Emergency Use Authorization (EUA). This EUA will remain  in effect (meaning this test can be used) for the duration of  the COVID-19 declaration under Section 564(b)(1) of the Act, 21 U.S.C. section 360bbb-3(b)(1), unless the authorization is terminated or revoked sooner.    Influenza A by PCR NEGATIVE NEGATIVE   Influenza B by PCR NEGATIVE NEGATIVE    Comment: (NOTE) The Xpert Xpress SARS-CoV-2/FLU/RSV assay is intended as an aid in  the diagnosis of influenza from Nasopharyngeal swab specimens and  should not be used as a sole basis for treatment. Nasal washings and  aspirates are unacceptable for Xpert Xpress SARS-CoV-2/FLU/RSV  testing. Fact Sheet for Patients: https://www.moore.com/ Fact Sheet for Healthcare Providers: https://www.young.biz/ This test is not yet approved or cleared by the [place] FDA and  has been authorized for detection and/or diagnosis of SARS-CoV-2 by  FDA under an Emergency Use Authorization (EUA). This EUA will remain  in effect (meaning this test can be used) for the duration of the  Covid-19 declaration under Section 564(b)(1) of the Act, 21  U.S.C. section 360bbb-3(b)(1), unless the authorization is  terminated or revoked. Performed at [facility], [address] [place], [place] [address]    CT Head Wo Contrast  Result Date: 06/03/2019 CLINICAL DATA:  Encephalopathy fall EXAM: CT HEAD WITHOUT CONTRAST TECHNIQUE: Contiguous axial images were obtained from the base of the skull through the vertex without intravenous contrast. COMPARISON:  CT brain 06/10/2016 FINDINGS: Brain: No acute territorial infarction, hemorrhage, or intracranial mass. Moderate atrophy. Minimal hypodensity in the periventricular white matter likely chronic small vessel ischemic change. Stable ventricle size Vascular: No hyperdense vessels.  Carotid vascular calcification Skull: Normal. Negative for fracture or focal lesion. Sinuses/Orbits: Mucosal thickening in the ethmoid sinuses Other: None IMPRESSION: 1. No CT evidence for acute intracranial  abnormality. 2. Atrophy and mild chronic small vessel ischemic change of the white matter Electronically Signed   By:  Jasmine Pang M.D.   On: 06/03/2019 00:35   DG Chest Portable 1 View  Result Date: 06/02/2019 CLINICAL DATA:  Status post fall. EXAM: PORTABLE CHEST 1 VIEW COMPARISON:  April 01, 2019 FINDINGS: Mild to moderate severity diffuse chronic appearing increased interstitial lung markings are seen. Mild, stable atelectasis is seen along the lateral aspect of the left lung base. There is no evidence of a pleural effusion or pneumothorax. The heart size and mediastinal contours are within normal limits. There is marked severity calcification of the aortic arch. Multilevel degenerative changes are noted throughout the thoracic spine. IMPRESSION: Chronic-appearing increased interstitial lung markings with mild, stable left basilar atelectasis. Electronically Signed   By: Aram Candela M.D.   On: 06/02/2019 23:46    Pending Labs Unresulted Labs (From admission, onward)    Start     Ordered   06/03/19 0500  CBC  Tomorrow morning,   STAT     06/03/19 0440   06/03/19 0500  Basic metabolic panel  Tomorrow morning,   STAT     06/03/19 0440   06/03/19 0447  Blood gas, arterial ([facility] & [facility] ONLY)  ONCE - STAT,   STAT     06/03/19 0446   06/03/19 0439  TSH  Once,   STAT     06/03/19 0440   06/03/19 0041  Urine Culture  Add-on,   AD     06/03/19 0041          Vitals/Pain Today's Vitals   06/03/19 0330 06/03/19 0400 06/03/19 0450 06/03/19 0500  BP: (!) 115/42 (!) 99/43 (!) 89/40 (!) 106/53  Pulse: 61 62 (!) 59 (!) 58  Resp: (!) 21 17 18 14   Temp:      TempSrc:      SpO2: 96% 95% 95% 94%  Weight:      Height:      PainSc:        Isolation Precautions No active isolations  Medications Medications  0.9 %  sodium chloride infusion ( Intravenous New Bag/Given 06/03/19 0332)  ipratropium-albuterol (DUONEB) 0.5-2.5 (3) MG/3ML nebulizer solution 3 mL (has no administration in time  range)  amLODipine (NORVASC) tablet 10 mg (has no administration in time range)  carvedilol (COREG) tablet 6.25 mg (has no administration in time range)  pravastatin (PRAVACHOL) tablet 40 mg (has no administration in time range)  pantoprazole (PROTONIX) EC tablet 40 mg (has no administration in time range)  tamsulosin (FLOMAX) capsule 0.4 mg (has no administration in time range)  ferrous sulfate tablet 325 mg (has no administration in time range)  feeding supplement (ENSURE ENLIVE) (ENSURE ENLIVE) liquid 237 mL (has no administration in time range)  Potassium Chloride ER TBCR 20 mEq (has no administration in time range)  Fluticasone-Salmeterol (ADVAIR) 100-50 MCG/DOSE inhaler 1 puff (has no administration in time range)  montelukast (SINGULAIR) tablet 10 mg (has no administration in time range)  enoxaparin (LOVENOX) injection 40 mg (has no administration in time range)  0.9 %  sodium chloride infusion ( Intravenous Not Given 06/03/19 0449)  acetaminophen (TYLENOL) tablet 650 mg (has no administration in time range)    Or  acetaminophen (TYLENOL) suppository 650 mg (has no administration in time range)  traZODone (DESYREL) tablet 25 mg (has no administration in time range)  magnesium hydroxide (MILK OF MAGNESIA) suspension 30 mL (has no administration in time range)  ondansetron (ZOFRAN) tablet 4 mg (has no administration in time range)    Or  ondansetron (ZOFRAN) injection 4 mg (has  no administration in time range)  cefTRIAXone (ROCEPHIN) 1 g in sodium chloride 0.9 % 100 mL IVPB (has no administration in time range)  0.9 %  sodium chloride infusion (has no administration in time range)  norepinephrine (LEVOPHED) 4mg  in premix infusion (has no administration in time range)  0.9 %  sodium chloride infusion (has no administration in time range)  norepinephrine (LEVOPHED) 4mg  in premix infusion (has no administration in time range)  sodium chloride 0.9 % bolus 1,000 mL (0 mLs  Intravenous Stopped 06/03/19 0258)    Mobility non-ambulatory High fall risk   Focused Assessments Pulmonary Assessment Handoff:  Lung sounds:   O2 Device: Bi-PAP        R Recommendations: See Admitting Provider Note  Report given to:   Additional Notes:

## 2019-06-03 NOTE — ED Provider Notes (Signed)
[facility] Emergency Department Provider Note  ____________________________________________  Time seen: Approximately 12:31 AM  I have reviewed the triage vital signs and the nursing notes.   HISTORY  Chief Complaint Fall and Weakness  Level 5 caveat:  Portions of the history and physical were unable to be obtained due to confusion   HPI Douglas Mathis is a 84 y.o. male with a history of COPD on 3L [place], diastolic CHF, anemia, hypertension, hyperlipidemia who presents from home for generalized weakness and fall.   Patient arrived somnolent but arousable and able to provide some history.  He reports that he lives alone and has been feeling weak over the last week.  Reports that he has had a fall couple days ago.  Also had a fall today.  Unknown how long he was on the floor.  EMS reports foul urine smell in the house.  Patient does seem slightly confused.  He denies cough, chest pain or shortness of breath, abdominal pain, vomiting or diarrhea.  He denies neck pain, back pain, extremity pain.  Past Medical History:  Diagnosis Date  . Anemia   . BPH (benign prostatic hyperplasia)   . CHF (congestive heart failure) (HCC)   . COPD (chronic obstructive pulmonary disease) (HCC)   . Hypercholesterolemia   . Hypertension     Patient Active Problem List   Diagnosis Date Noted  . Acute respiratory failure with hypoxia and hypercarbia (HCC) 06/03/2019  . Chronic diastolic (congestive) heart failure (HCC) 03/31/2018  . HTN (hypertension) 03/31/2018  . Eyelid edema, left 03/31/2018  . COPD (chronic obstructive pulmonary disease) (HCC) 02/26/2018  . COPD exacerbation (HCC) 10/25/2017    Past Surgical History:  Procedure Laterality Date  . cataracts    . ESOPHAGOGASTRODUODENOSCOPY N/A 07/10/2015   Procedure: ESOPHAGOGASTRODUODENOSCOPY (EGD);  Surgeon: Scot Jun, MD;  Location: [facility] ENDOSCOPY;  Service: Endoscopy;  Laterality: N/A;  .  ESOPHAGOGASTRODUODENOSCOPY N/A 01/18/2018   Procedure: ESOPHAGOGASTRODUODENOSCOPY (EGD);  Surgeon: Toledo, Boykin Nearing, MD;  Location: [facility] ENDOSCOPY;  Service: Gastroenterology;  Laterality: N/A;    Prior to Admission medications   Medication Sig Start Date End Date Taking? Authorizing Provider  amLODipine (NORVASC) 10 MG tablet Take 1 tablet (10 mg total) by mouth daily for 30 days. 03/23/18 04/22/18  Ihor Austin, MD  amoxicillin-clavulanate (AUGMENTIN) 875-125 MG tablet Take 1 tablet by mouth 2 (two) times daily. 04/01/19   Sharman Cheek, MD  carvedilol (COREG) 6.25 MG tablet Take 1 tablet (6.25 mg total) by mouth 2 (two) times daily. 03/22/18 03/22/19  Pyreddy, Vivien Rota, MD  DOK 100 MG capsule Take 100 mg by mouth 2 (two) times daily. 09/28/17   [provider]  feeding supplement, ENSURE ENLIVE, (ENSURE ENLIVE) LIQD Take 237 mLs by mouth 2 (two) times daily between meals. 03/22/18   Ihor Austin, MD  ferrous sulfate 325 (65 FE) MG tablet Take 1 tablet (325 mg total) by mouth 2 (two) times daily with a meal. 07/22/15   Renae Gloss, Richard, MD  Fluticasone-Salmeterol (ADVAIR DISKUS IN) Inhale 1 puff into the lungs 2 (two) times daily.    [provider]  Fluticasone-Umeclidin-Vilant 100-62.5-25 MCG/INH AEPB Inhale 1 puff into the lungs daily. 12/13/17   [provider]  furosemide (LASIX) 40 MG tablet Take 1 tablet (40 mg total) by mouth 2 (two) times daily for 30 days. 03/22/18 04/21/18  Ihor Austin, MD  GNP VITAMIN C 500 MG tablet Take 500 mg by mouth 2 (two) times daily. 09/22/17   [provider]  ipratropium-albuterol (DUONEB) 0.5-2.5 (3) MG/3ML SOLN Take 3 mLs by nebulization every 6 (six) hours as needed. 10/27/17   Salary, Evelena Asa, MD  lisinopril (PRINIVIL,ZESTRIL) 5 MG tablet Take 1 tablet (5 mg total) by mouth daily for 30 days. 03/22/18 04/21/18  Ihor Austin, MD  lovastatin (MEVACOR) 40 MG tablet Take 40 mg by mouth at bedtime. 09/23/17   [provider]  montelukast (SINGULAIR) 10 MG tablet Take 10 mg by mouth daily. 10/22/17   [provider]  pantoprazole (PROTONIX) 40 MG tablet Take 40 mg by mouth 2 (two) times daily. 12/08/17   [provider]  potassium chloride 20 MEQ TBCR Take 20 mEq by mouth 2 (two) times daily for 30 days. 03/22/18 04/21/18  Ihor Austin, MD  predniSONE (DELTASONE) 20 MG tablet Take 2 tablets (40 mg total) by mouth daily. 04/01/19   Sharman Cheek, MD  PROAIR HFA 108 [id number] Base) MCG/ACT inhaler Inhale 2 puffs into the lungs 4 (four) times daily as needed for wheezing or shortness of breath.  08/04/17   [provider]  tamsulosin (FLOMAX) 0.4 MG CAPS capsule Take 0.4 mg by mouth daily after breakfast.     [provider]    Allergies Patient has no known allergies.  Family History  Problem Relation Age of Onset  . Diabetes Mother   . CAD Mother   . Bone cancer Brother   . Bronchitis Father     Social History Social History   Tobacco Use  . Smoking status: Current Every Day Smoker    Packs/day: 0.50  . Smokeless tobacco: Never Used  Substance Use Topics  . Alcohol use: Not Currently    Comment: used to drink a bottle on the weekend with friends  . Drug use: Never    Review of Systems  Constitutional: Negative for fever. + confusion, weakness Neck: No neck pain  Cardiovascular: Negative for chest pain. Respiratory: Negative for shortness of breath. Gastrointestinal: Negative for abdominal pain, vomiting or diarrhea. Genitourinary: Negative for dysuria. Musculoskeletal: Negative for back pain. Skin: Negative for rash. Neurological: Negative for headaches, weakness or numbness. Psych: No SI or HI  ____________________________________________   PHYSICAL EXAM:  VITAL SIGNS: ED Triage Vitals  Enc Vitals Group     BP 06/02/19 2351 (!) 108/49     Pulse Rate 06/02/19 2351 78     Resp 06/02/19 2351 18     Temp 06/02/19 2313 98.5 F (36.9 C)      Temp src --      SpO2 06/02/19 2231 92 %     Weight 06/02/19 2234 158 lb 11.7 oz (72 kg)     Height 06/02/19 2234 5\' 3"  (1.6 m)     Head Circumference --      Peak Flow --      Pain Score 06/02/19 2234 0     Pain Loc --      Pain Edu? --      Excl. in GC? --     Constitutional: Somnolent but arousable, slow to answer questions HEENT:      Head: Normocephalic and atraumatic.         Eyes: Conjunctivae are normal. Sclera is non-icteric.       Mouth/Throat: Mucous membranes are dry.       Neck: Supple with no signs of meningismus.  No C-spine tenderness Cardiovascular: Regular rate and rhythm. No murmurs, gallops, or rubs. 2+ symmetrical distal pulses are present in all extremities.  Respiratory: Faint crackles bilaterally and faint expiratory wheezes, sating well on baseline 3L, normal WOB Gastrointestinal: Soft, non tender, and non distended Musculoskeletal: Nontender with normal range of motion in all extremities. No edema, cyanosis, or erythema of extremities. Neurologic: Normal speech and language. Face is symmetric. Moving all extremities. No gross focal neurologic deficits are appreciated. Skin: Skin is warm, dry and intact. No rash noted. Psychiatric: Mood and affect are normal. Speech and behavior are normal.  ____________________________________________   LABS (all labs ordered are listed, but only abnormal results are displayed)  Labs Reviewed  CBC WITH DIFFERENTIAL/PLATELET - Abnormal; Notable for the following components:      Result Value   WBC 12.4 (*)    RBC 2.91 (*)    Hemoglobin 8.2 (*)    HCT 25.9 (*)    RDW 19.8 (*)    Neutro Abs 10.3 (*)    Monocytes Absolute 1.1 (*)    Abs Immature Granulocytes 0.18 (*)    All other components within normal limits  COMPREHENSIVE METABOLIC PANEL - Abnormal; Notable for the following components:   Sodium 128 (*)    Chloride 85 (*)    CO2 33 (*)    Glucose, Bld 204 (*)    BUN 28 (*)    Calcium 8.7 (*)    All other  components within normal limits  URINALYSIS, COMPLETE (UACMP) WITH MICROSCOPIC - Abnormal; Notable for the following components:   Color, Urine YELLOW (*)    APPearance HAZY (*)    Hgb urine dipstick SMALL (*)    Leukocytes,Ua MODERATE (*)    Bacteria, UA RARE (*)    All other components within normal limits  GLUCOSE, CAPILLARY - Abnormal; Notable for the following components:   Glucose-Capillary 181 (*)    All other components within normal limits  BLOOD GAS, VENOUS - Abnormal; Notable for the following components:   pCO2, Ven 87 (*)    Bicarbonate 39.0 (*)    Acid-Base Excess 8.4 (*)    All other components within normal limits  RESPIRATORY PANEL BY RT PCR (FLU A&B, COVID)  URINE CULTURE  CK   ____________________________________________  EKG  ED ECG REPORT I, Nita Sickle, the attending physician, personally viewed and interpreted this ECG.  Sinus rhythm with first-degree AV block, right bundle branch block, normal QTC, right axis deviation, no ST elevations or depressions.  Unchanged from prior. ____________________________________________  RADIOLOGY  I have personally reviewed the images performed during this visit and I agree with the Radiologist's read.   Interpretation by Radiologist:  CT Head Wo Contrast  Result Date: 06/03/2019 CLINICAL DATA:  Encephalopathy fall EXAM: CT HEAD WITHOUT CONTRAST TECHNIQUE: Contiguous axial images were obtained from the base of the skull through the vertex without intravenous contrast. COMPARISON:  CT brain 06/10/2016 FINDINGS: Brain: No acute territorial infarction, hemorrhage, or intracranial mass. Moderate atrophy. Minimal hypodensity in the periventricular white matter likely chronic small vessel ischemic change. Stable ventricle size Vascular: No hyperdense vessels.  Carotid vascular calcification Skull: Normal. Negative for fracture or focal lesion. Sinuses/Orbits: Mucosal thickening in the ethmoid sinuses Other: None  IMPRESSION: 1. No CT evidence for acute intracranial abnormality. 2. Atrophy and mild chronic small vessel ischemic change of the white matter Electronically Signed   By: Jasmine Pang M.D.   On: 06/03/2019 00:35   DG Chest Portable 1 View  Result Date: 06/02/2019 CLINICAL DATA:  Status post fall. EXAM: PORTABLE CHEST 1 VIEW COMPARISON:  April 01, 2019 FINDINGS: Mild to moderate severity  diffuse chronic appearing increased interstitial lung markings are seen. Mild, stable atelectasis is seen along the lateral aspect of the left lung base. There is no evidence of a pleural effusion or pneumothorax. The heart size and mediastinal contours are within normal limits. There is marked severity calcification of the aortic arch. Multilevel degenerative changes are noted throughout the thoracic spine. IMPRESSION: Chronic-appearing increased interstitial lung markings with mild, stable left basilar atelectasis. Electronically Signed   By: Virgina Norfolk M.D.   On: 06/02/2019 23:46     ____________________________________________   PROCEDURES  Procedure(s) performed:yes .1-3 Lead EKG Interpretation Performed by: Rudene Re, MD Authorized by: Rudene Re, MD     Interpretation: abnormal     ECG rate:  70   ECG rate assessment: normal     Rhythm: A-V block     Ectopy: none     Conduction: abnormal     Abnormal conduction: 1st degree AV block     Abnormal conduction comment:  RBBB   Critical Care performed: yes  CRITICAL CARE Performed by: Rudene Re  ?  Total critical care time: 40 min  Critical care time was exclusive of separately billable procedures and treating other patients.  Critical care was necessary to treat or prevent imminent or life-threatening deterioration.  Critical care was time spent personally by me on the following activities: development of treatment plan with patient and/or surrogate as well as nursing, discussions with consultants, evaluation  of patient's response to treatment, examination of patient, obtaining history from patient or surrogate, ordering and performing treatments and interventions, ordering and review of laboratory studies, ordering and review of radiographic studies, pulse oximetry and re-evaluation of patient's condition.  ____________________________________________   INITIAL IMPRESSION / ASSESSMENT AND PLAN / ED COURSE  84 y.o. male with a history of COPD on 3L [place], diastolic CHF, anemia, hypertension, hyperlipidemia who presents from home for generalized weakness and fall.   Patient arrives somnolent but arousable, no signs of trauma on exam, face is symmetric, moving all extremities, patient is able to answer simple questions.  Has been weak over the last week and has had several falls.  After arrival patient became profoundly hypotensive with systolics in the 52D.  Patient looks dry on exam and he was started on IV fluids with improvement of his blood pressure and mental status.  A VBG was done due to concerns of hypercapnia.  pH of 7.26 with a PCO2 of 88. Patient was placed on Bipap.  Chest x-ray ordered, visualized and interpreted by me with no acute findings which was confirmed by radiology.  EKG showing stable right bundle branch block and first-degree AV block.  Cardiac monitoring reviewed by me and interpreted as above.  Differential diagnosis broad including dehydration, intracranial injury, rhabdo, hypercapnia, pneumonia, electrolyte abnormalities, UTI, cardiac dysrhythmias.  Patient be monitored closely for any signs of dysrhythmias.  Labs showing mild leukocytosis which seems to be patient's baseline, mild stable anemia, slightly elevated glucose with no signs of DKA.  Mild hyponatremia which seems to be chronic for patient as well.  UA with rare bacteria and WBCs but no nitrites.  Patient is asymptomatic from a UTI.  Will send for culture and hold antibiotics at this time.  CK WNL with no signs of rhabdo. CT head  visualized and interpreted by me with no intraacute injuries which was confirmed by radiology.  Will admit patient to the hospitalist service.  Patient was discussed with Dr. Sidney Ace for admission.     _____________________________________________ Please  note:  Patient was evaluated in Emergency Department today for the symptoms described in the history of present illness. Patient was evaluated in the context of the global COVID-19 pandemic, which necessitated consideration that the patient might be at risk for infection with the SARS-CoV-2 virus that causes COVID-19. Institutional protocols and algorithms that pertain to the evaluation of patients at risk for COVID-19 are in a state of rapid change based on information released by regulatory bodies including the CDC and federal and state organizations. These policies and algorithms were followed during the patient's care in the ED.  Some ED evaluations and interventions may be delayed as a result of limited staffing during the pandemic.   [place] Controlled Substance Database was reviewed by me. ____________________________________________   FINAL CLINICAL IMPRESSION(S) / ED DIAGNOSES   Final diagnoses:  Acute on chronic respiratory failure with hypercapnia (HCC)  Hyponatremia  Fall, initial encounter  Hypotension, unspecified hypotension type  Generalized weakness      NEW MEDICATIONS STARTED DURING THIS VISIT:  ED Discharge Orders    None       Note:  This document was prepared using Dragon voice recognition software and may include unintentional dictation errors.    Don Perking, [place], MD 06/03/19 [phone]

## 2019-06-03 NOTE — Progress Notes (Signed)
eLink Physician-Brief Progress Note Patient Name: Douglas Mathis DOB: 1935-11-17 MRN: [id number]   Date of Service  06/03/2019  HPI/Events of Note  Pt admitted with acute on chronic hypoxic/ hypercapnic respiratory failure and multi-factorial shock.  eICU Interventions  New Patient Evaluation completed.        [name] 06/03/2019, 7:04 AM

## 2019-06-03 NOTE — ED Notes (Signed)
Updated daughter Douglas Mathis

## 2019-06-03 NOTE — H&P (Addendum)
[facility] [place] NAME: Douglas Mathis    MR#:  [id number]  DATE OF BIRTH:  Nov 17, 1935  DATE OF ADMISSION: 06/03/2019  PRIMARY CARE PHYSICIAN: [facility] [place]   REQUESTING/REFERRING PHYSICIAN: Gonzella Lex, MD  CHIEF COMPLAINT:   Chief Complaint  Patient presents with  . Fall  . Weakness    HISTORY OF PRESENT ILLNESS:  Douglas Mathis  is a 84 y.o. African-American male with a known history of hypertension, dyslipidemia, COPD, chronic respiratory failure on home O2 at 3 L/min, CHF and BPH, who presented to the emergency room with acute onset of altered mental status and generalized weakness with low blood pressure and recent syncope twice.  He denied any chest pain or palpitations.  Patient was having dyspnea with associated mild cough and wheezing in the ER.  No fever or chills.  No nausea vomiting or abdominal pain or diarrhea.  No melena or bright red bleeding per rectum or other bleeding diathesis.  He denied dysuria, oliguria or hematuria or flank pain.  Upon presentation to the emergency room, vital signs were within normal and later blood pressure was 89/38 with a temperature of 96.9 respiratory to 21.  Blood pressures improved to 115/42 and later was 99/43 with a pulse of 62.  Labs revealed a venous blood gas with pH 7.26, PCO2 of 87 and PO2 35 with bicarbonate of 39.  Influenza antigens and COVID-19 PCR came back negative.  CMP was remarkable for hyponatremia and hypochloremia with a potassium of 5.1 blood glucose of 204 and BUN of 28 with creatinine of 1.07.  CK was 95 and CBC showed leukocytosis of 12.4 with anemia and urinalysis showed 6-10 WBCs with 11-20 RBCs and hyaline casts and rare bacteria.  Urine culture was sent.  Noncontrasted head CT scan revealed no acute intracranial normalities.  It showed atrophy and mild chronic small vessel ischemic changes of the white matter.  Chest x-ray showed chronic appearing increased  interstitial lung markings with mild stable left basal atelectasis.   EKG showed sinus rhythm with rate of 70 with first-degree AV block, left axis deviation and right bundle branch block.  The patient was given 1 L bolus of IV normal saline followed by 125 mL/h.  He was placed on BiPAP for his respiratory failure.  He will be admitted to a stepdown unit bed for further evaluation and management   PAST MEDICAL HISTORY:   Past Medical History:  Diagnosis Date  . Anemia   . BPH (benign prostatic hyperplasia)   . CHF (congestive heart failure) ([place])   . COPD (chronic obstructive pulmonary disease) ([place])   . Hypercholesterolemia   . Hypertension     PAST SURGICAL HISTORY:   Past Surgical History:  Procedure Laterality Date  . cataracts    . ESOPHAGOGASTRODUODENOSCOPY N/A 07/10/2015   Procedure: ESOPHAGOGASTRODUODENOSCOPY (EGD);  Surgeon: Manya Silvas, MD;  Location: [facility] ENDOSCOPY;  Service: Endoscopy;  Laterality: N/A;  . ESOPHAGOGASTRODUODENOSCOPY N/A 01/18/2018   Procedure: ESOPHAGOGASTRODUODENOSCOPY (EGD);  Surgeon: Toledo, Benay Pike, MD;  Location: [facility] ENDOSCOPY;  Service: Gastroenterology;  Laterality: N/A;    SOCIAL HISTORY:   Social History   Tobacco Use  . Smoking status: Current Every Day Smoker    Packs/day: 0.50  . Smokeless tobacco: Never Used  Substance Use Topics  . Alcohol use: Not Currently    Comment: used to drink a bottle on the weekend with friends    FAMILY HISTORY:   Family History  Problem Relation Age of Onset  . Diabetes Mother   . CAD Mother   . Bone cancer Brother   . Bronchitis Father     DRUG ALLERGIES:  No Known Allergies  REVIEW OF SYSTEMS:   ROS As per history of present illness. All pertinent systems were reviewed above. Constitutional,  HEENT, cardiovascular, respiratory, GI, GU, musculoskeletal, neuro, psychiatric, endocrine,  integumentary and hematologic systems were reviewed and are otherwise  negative/unremarkable  except for positive findings mentioned above in the HPI.   MEDICATIONS AT HOME:   Prior to Admission medications   Medication Sig Start Date End Date Taking? Authorizing Provider  amLODipine (NORVASC) 10 MG tablet Take 1 tablet (10 mg total) by mouth daily for 30 days. 03/23/18 04/22/18  Ihor Austin, MD  amoxicillin-clavulanate (AUGMENTIN) 875-125 MG tablet Take 1 tablet by mouth 2 (two) times daily. 04/01/19   Sharman Cheek, MD  carvedilol (COREG) 6.25 MG tablet Take 1 tablet (6.25 mg total) by mouth 2 (two) times daily. 03/22/18 03/22/19  Pyreddy, Vivien Rota, MD  DOK 100 MG capsule Take 100 mg by mouth 2 (two) times daily. 09/28/17   [provider]  feeding supplement, ENSURE ENLIVE, (ENSURE ENLIVE) LIQD Take 237 mLs by mouth 2 (two) times daily between meals. 03/22/18   Ihor Austin, MD  ferrous sulfate 325 (65 FE) MG tablet Take 1 tablet (325 mg total) by mouth 2 (two) times daily with a meal. 07/22/15   Renae Gloss, Richard, MD  Fluticasone-Salmeterol (ADVAIR DISKUS IN) Inhale 1 puff into the lungs 2 (two) times daily.    [provider]  Fluticasone-Umeclidin-Vilant 100-62.5-25 MCG/INH AEPB Inhale 1 puff into the lungs daily. 12/13/17   [provider]  furosemide (LASIX) 40 MG tablet Take 1 tablet (40 mg total) by mouth 2 (two) times daily for 30 days. 03/22/18 04/21/18  Ihor Austin, MD  GNP VITAMIN C 500 MG tablet Take 500 mg by mouth 2 (two) times daily. 09/22/17   [provider]  ipratropium-albuterol (DUONEB) 0.5-2.5 (3) MG/3ML SOLN Take 3 mLs by nebulization every 6 (six) hours as needed. 10/27/17   Salary, Evelena Asa, MD  lisinopril (PRINIVIL,ZESTRIL) 5 MG tablet Take 1 tablet (5 mg total) by mouth daily for 30 days. 03/22/18 04/21/18  Ihor Austin, MD  lovastatin (MEVACOR) 40 MG tablet Take 40 mg by mouth at bedtime. 09/23/17   [provider]  montelukast (SINGULAIR) 10 MG tablet Take 10 mg by mouth daily. 10/22/17   [provider]   pantoprazole (PROTONIX) 40 MG tablet Take 40 mg by mouth 2 (two) times daily. 12/08/17   [provider]  potassium chloride 20 MEQ TBCR Take 20 mEq by mouth 2 (two) times daily for 30 days. 03/22/18 04/21/18  Ihor Austin, MD  predniSONE (DELTASONE) 20 MG tablet Take 2 tablets (40 mg total) by mouth daily. 04/01/19   Sharman Cheek, MD  PROAIR HFA 108 [phone] Base) MCG/ACT inhaler Inhale 2 puffs into the lungs 4 (four) times daily as needed for wheezing or shortness of breath.  08/04/17   [provider]  tamsulosin (FLOMAX) 0.4 MG CAPS capsule Take 0.4 mg by mouth daily after breakfast.     [provider]      VITAL SIGNS:  Blood pressure (!) 99/43, pulse 62, temperature (!) 96.9 F (36.1 C), temperature source Rectal, resp. rate 17, height 5\' 3"  (1.6 m), weight 72 kg, SpO2 95 %.  PHYSICAL EXAMINATION:  Physical Exam  GENERAL:  84 y.o.-year-old African-American male patient lying  in the bed with mild respiratory distress on BiPAP. EYES: Pupils equal, round, reactive to light and accommodation. No scleral icterus. Extraocular muscles intact.  HEENT: Head atraumatic, normocephalic.  OP: BiPAP mask in place. NECK:  Supple, no jugular venous distention. No thyroid enlargement, no tenderness.  LUNGS: Slightly diminished bibasal breath sounds with occasional wheezing. CARDIOVASCULAR: Regular rate and rhythm, S1, S2 normal. No murmurs, rubs, or gallops.  ABDOMEN: Soft, nondistended, nontender. Bowel sounds present. No organomegaly or mass.  EXTREMITIES: No pedal edema, cyanosis, or clubbing.  NEUROLOGIC: Cranial nerves II through XII are intact. Muscle strength 5/5 in all extremities. Sensation intact. Gait not checked.  PSYCHIATRIC: The patient is alert and oriented x 3.  Normal affect and good eye contact. SKIN: No obvious rash, lesion, or ulcer.   LABORATORY PANEL:   CBC Recent Labs  Lab 06/02/19 2237  WBC 12.4*  HGB 8.2*  HCT 25.9*  PLT 233    ------------------------------------------------------------------------------------------------------------------  Chemistries  Recent Labs  Lab 06/02/19 2237  NA 128*  K 5.1  CL 85*  CO2 33*  GLUCOSE 204*  BUN 28*  CREATININE 1.07  CALCIUM 8.7*  AST 33  ALT 23  ALKPHOS 55  BILITOT 0.6   ------------------------------------------------------------------------------------------------------------------  Cardiac Enzymes No results for input(s): TROPONINI in the last 168 hours. ------------------------------------------------------------------------------------------------------------------  RADIOLOGY:  CT Head Wo Contrast  Result Date: 06/03/2019 CLINICAL DATA:  Encephalopathy fall EXAM: CT HEAD WITHOUT CONTRAST TECHNIQUE: Contiguous axial images were obtained from the base of the skull through the vertex without intravenous contrast. COMPARISON:  CT brain 06/10/2016 FINDINGS: Brain: No acute territorial infarction, hemorrhage, or intracranial mass. Moderate atrophy. Minimal hypodensity in the periventricular white matter likely chronic small vessel ischemic change. Stable ventricle size Vascular: No hyperdense vessels.  Carotid vascular calcification Skull: Normal. Negative for fracture or focal lesion. Sinuses/Orbits: Mucosal thickening in the ethmoid sinuses Other: None IMPRESSION: 1. No CT evidence for acute intracranial abnormality. 2. Atrophy and mild chronic small vessel ischemic change of the white matter Electronically Signed   By: Jasmine Pang M.D.   On: 06/03/2019 00:35   DG Chest Portable 1 View  Result Date: 06/02/2019 CLINICAL DATA:  Status post fall. EXAM: PORTABLE CHEST 1 VIEW COMPARISON:  April 01, 2019 FINDINGS: Mild to moderate severity diffuse chronic appearing increased interstitial lung markings are seen. Mild, stable atelectasis is seen along the lateral aspect of the left lung base. There is no evidence of a pleural effusion or pneumothorax. The heart size  and mediastinal contours are within normal limits. There is marked severity calcification of the aortic arch. Multilevel degenerative changes are noted throughout the thoracic spine. IMPRESSION: Chronic-appearing increased interstitial lung markings with mild, stable left basilar atelectasis. Electronically Signed   By: Aram Candela M.D.   On: 06/02/2019 23:46      IMPRESSION AND PLAN:  1.  Acute on chronic hypoxic and hypercarbic respiratory failure. -This could be related to orthostatic hypotension and syncope. -The patient will be admitted to stepdown unit bed. -Continue BiPAP as tolerated and taper to nasal cannula when ready. -We will follow his ABG.  2.  Recurrent syncope, like secondary to orthostatic hypotension. -We will obtain a 2D echo and carotid Doppler for further assessment. -We will check his orthostatics every 12 hours. -The patient will be hydrated with IV normal saline.  3.  Hyponatremia and hypochloremia. -This is likely hypovolemic. -The patient will be hydrated with IV normal saline. -We will follow his BMPs.  4.  COPD.  He  does not seem to have acute exacerbation. -We will place him on as needed duo nebs and continue his Trelegy.  5.  Hypertension. -We will continue his Lopressor.  6.  Dyslipidemia. -We will continue statin therapy.  7.  BPH. -We will continue Flomax.  8.  DVT prophylaxis. -Subcutaneous Lovenox.    All the records are reviewed and case discussed with ED provider. The plan of care was discussed in details with the patient (and family). I answered all questions. The patient agreed to proceed with the above mentioned plan. Further management will depend upon hospital course.   CODE STATUS: The patient is DNR/DNI  Status is: Inpatient  Remains inpatient appropriate because:Hemodynamically unstable   Dispo: The patient is from: Home              Anticipated d/c is to: Home              Anticipated d/c date is: 2 days               Patient currently is not medically stable to d/c.   TOTAL TIME TAKING CARE OF THIS PATIENT: 55 minutes.    Hannah Beat M.D on 06/03/2019 at 4:17 AM  [facility]   From 7 PM-7 AM, contact night-coverage [email or web address]  CC: Primary care physician; [facility]   Note: This dictation was prepared with Nurse, children's dictation along with smaller phrase technology. Any transcriptional errors that result from this process are unintentional.

## 2019-06-03 NOTE — ED Notes (Signed)
Mansy to order levophed for persistent hypotension

## 2019-06-04 ENCOUNTER — Inpatient Hospital Stay
Admit: 2019-06-04 | Discharge: 2019-06-04 | Disposition: A | Discharge status: Home / Self Care | Payer: Medicare Other | Attending: Family Medicine | Admitting: Family Medicine

## 2019-06-04 DIAGNOSIS — R55 Syncope and collapse: Secondary | ICD-10-CM

## 2019-06-04 DIAGNOSIS — J9622 Acute and chronic respiratory failure with hypercapnia: Secondary | ICD-10-CM

## 2019-06-04 DIAGNOSIS — W19XXXA Unspecified fall, initial encounter: Secondary | ICD-10-CM

## 2019-06-04 DIAGNOSIS — E871 Hypo-osmolality and hyponatremia: Secondary | ICD-10-CM

## 2019-06-04 DIAGNOSIS — R531 Weakness: Secondary | ICD-10-CM

## 2019-06-04 LAB — RETICULOCYTES
Immature Retic Fract: 26.6 % — ABNORMAL HIGH (ref 2.3–15.9)
RBC.: 2.56 MIL/uL — ABNORMAL LOW (ref 4.22–5.81)
Retic Count, Absolute: 151.8 10*3/uL (ref 19.0–186.0)
Retic Ct Pct: 5.9 % — ABNORMAL HIGH (ref 0.4–3.1)

## 2019-06-04 LAB — CBC
HCT: 24.2 % — ABNORMAL LOW (ref 39.0–52.0)
Hemoglobin: 7.2 g/dL — ABNORMAL LOW (ref 13.0–17.0)
MCH: 27.7 pg (ref 26.0–34.0)
MCHC: 29.8 g/dL — ABNORMAL LOW (ref 30.0–36.0)
MCV: 93.1 fL (ref 80.0–100.0)
Platelets: 243 10*3/uL (ref 150–400)
RBC: 2.6 MIL/uL — ABNORMAL LOW (ref 4.22–5.81)
RDW: 19.4 % — ABNORMAL HIGH (ref 11.5–15.5)
WBC: 11.7 10*3/uL — ABNORMAL HIGH (ref 4.0–10.5)
nRBC: 0 % (ref 0.0–0.2)

## 2019-06-04 LAB — PROCALCITONIN: Procalcitonin: <0.1 ng/mL

## 2019-06-04 LAB — ECHOCARDIOGRAM COMPLETE
Height: 63 in
Weight: 2483.26 oz

## 2019-06-04 LAB — VITAMIN B12: Vitamin B-12: 684 pg/mL (ref 180–914)

## 2019-06-04 LAB — IRON AND TIBC
Iron: 24 ug/dL — ABNORMAL LOW (ref 45–182)
Saturation Ratios: 7 % — ABNORMAL LOW (ref 17.9–39.5)
TIBC: 340 ug/dL (ref 250–450)
UIBC: 316 ug/dL

## 2019-06-04 LAB — FOLATE: Folate: 17.3 ng/mL (ref >5.9)

## 2019-06-04 LAB — FERRITIN: Ferritin: 20 ng/mL — ABNORMAL LOW (ref 24–336)

## 2019-06-04 LAB — PREPARE RBC (CROSSMATCH)

## 2019-06-04 MED ORDER — ERYTHROMYCIN 5 MG/GM OP OINT
TOPICAL_OINTMENT | Freq: Three times a day (TID) | OPHTHALMIC | Status: DC
  Administered 2019-06-04 – 2019-06-06 (×6): 1 via OPHTHALMIC
  Filled 2019-06-04 (×2): qty 1

## 2019-06-04 MED ORDER — SODIUM CHLORIDE 0.9% IV SOLUTION: Freq: Once | INTRAVENOUS | Status: AC

## 2019-06-04 NOTE — Evaluation (Signed)
Physical Therapy Evaluation Patient Details Name: Douglas Mathis MRN: [id number] DOB: 1935/04/18 Today's Date: 06/04/2019   History of Present Illness  Pt is a 84 y/o M wo presented to the ED on 06/02/2019 following a fall at home. Current MD assessment includes Acute on chronic hypoxic and hypercarbic respiratory failure and Recurrent syncope, like secondary to orthostatic hypotension. PMH includes: COPD, CHF, HTN, and BPH.   Clinical Impression  Pt was agreeable to PT this afternoon but appeared to be confused on several occasions and perseverated on where the hospital refills their oxygen tanks t/o the session. Pt had some difficulty following commands and often appeared to not understand what was being asked of him. For example, when initially telling the pt to stand up, the pt stretched the RW as far out in front of him as he could and did not attempt to stand. In standing, pt was unable to take a step and had 3 separate posterior losses of balance when trying to walk that required mod A +2. Pt will benefit from PT services in a SNF setting upon discharge to safely address deficits listed in patient problem list for decreased caregiver assistance and eventual return to PLOF.      Follow Up Recommendations SNF    Equipment Recommendations  Rolling walker with 5" wheels    Recommendations for Other Services       Precautions / Restrictions Precautions Precautions: Fall Restrictions Weight Bearing Restrictions: No      Mobility  Bed Mobility Overal bed mobility: Needs Assistance Bed Mobility: Sit to Supine;Supine to Sit     Supine to sit: Min guard Sit to supine: Mod assist   General bed mobility comments: Pt appeared to be confused when prompted to complete bed mobility. During sit to supine transfer, pt tried to lay down across his bed rather than with his head at the head of the bed.  Transfers Overall transfer level: Needs assistance Equipment used: Rolling walker (2  wheeled) Transfers: Sit to/from Stand Sit to Stand: Min assist;+2 safety/equipment         General transfer comment: Pt was initally unable to stand up but with max cueing for hand placement to help initate transfer and min A +2, pt acheived standing.  Ambulation/Gait Ambulation/Gait assistance: Mod assist;+2 safety/equipment Gait Distance (Feet): 0 Feet Assistive device: Rolling walker (2 wheeled)       General Gait Details: When attempting to take a step, pt had a posterior LOB. This occured three times with mod A +2 before pt returned to a seated position  Stairs            Wheelchair Mobility    Modified Rankin (Stroke Patients Only)       Balance Overall balance assessment: Needs assistance;History of Falls Sitting-balance support: Feet supported;Single extremity supported Sitting balance-Leahy Scale: Fair     Standing balance support: Bilateral upper extremity supported Standing balance-Leahy Scale: Poor Standing balance comment: Multiple post LOB when attempting to take a step                             Pertinent Vitals/Pain Pain Assessment: No/denies pain    Home Living Family/patient expects to be discharged to:: Private residence Living Arrangements: Alone Available Help at Discharge: Family;Available PRN/intermittently Type of Home: Apartment Home Access: Elevator(Pt stated that he has to go up stairs but the daughter stated that he doesn't have any stairs and uses an elevator)  Home Layout: One level Home Equipment: Grab bars - toilet;Walker - 4 wheels;Grab bars - tub/shower;Shower seat - built in      Prior Function Level of Independence: Independent with assistive device(s)         Comments: Per pt, limited community amb, occasionally uses 4WW or motorized cart when at grocery store, ind with ADL's, 1 or 2 falls in past six months     Hand Dominance        Extremity/Trunk Assessment        Lower Extremity  Assessment Lower Extremity Assessment: Generalized weakness       Communication   Communication: Other (comment)(Pt was difficult to understand at times)  Cognition Arousal/Alertness: Awake/alert Behavior During Therapy: (pt appeared confused and occasionally had difficulty following one-step commands) Overall Cognitive Status: Difficult to assess                                 General Comments: Pt was difficult to understand a family was not present until final few minutes of the session. Pt appeared to perseverate on where the hospital gets their oxygen tanks filled and asked repeatedly t/o the session      General Comments      Exercises Total Joint Exercises Ankle Circles/Pumps: (unable to prefrom despite visual, verbal, and tactile cueing) Heel Slides: AROM;Strengthening;Both;10 reps Long Arc Quad: AROM;Strengthening;Both;10 reps Marching in Standing: AROM;Strengthening;Both(3 reps)   Assessment/Plan    PT Assessment Patient needs continued PT services  PT Problem List Decreased strength;Decreased mobility;Decreased safety awareness;Decreased range of motion;Decreased activity tolerance;Decreased cognition;Cardiopulmonary status limiting activity;Decreased balance;Decreased knowledge of use of DME       PT Treatment Interventions DME instruction;Therapeutic exercise;Gait training;Balance training;Functional mobility training;Therapeutic activities;Patient/family education    PT Goals (Current goals can be found in the Care Plan section)  Acute Rehab PT Goals Patient Stated Goal: "get home" PT Goal Formulation: With patient Time For Goal Achievement: 06/17/19 Potential to Achieve Goals: Good    Frequency Min 2X/week   Barriers to discharge Decreased caregiver support      Co-evaluation               AM-PAC PT "6 Clicks" Mobility  Outcome Measure Help needed turning from your back to your side while in a flat bed without using bedrails?: A  Little Help needed moving from lying on your back to sitting on the side of a flat bed without using bedrails?: A Little Help needed moving to and from a bed to a chair (including a wheelchair)?: Total Help needed standing up from a chair using your arms (e.g., wheelchair or bedside chair)?: A Little Help needed to walk in hospital room?: Total Help needed climbing 3-5 steps with a railing? : Total 6 Click Score: 12    End of Session Equipment Utilized During Treatment: Gait belt;Oxygen(3 LO2/min) Activity Tolerance: Other (comment)(Pt limited but unclear as to what limited him the most) Patient left: in bed;with call bell/phone within reach;with bed alarm set;with family/visitor present Nurse Communication: Mobility status;Other (comment)(pt desaturated to 86 after standing briefly) PT Visit Diagnosis: Unsteadiness on feet (R26.81);Repeated falls (R29.6);Muscle weakness (generalized) (M62.81);Difficulty in walking, not elsewhere classified (R26.2)    Time: 2878-6767 PT Time Calculation (min) (ACUTE ONLY): 44 min   Charges:              Annabelle Harman, SPT 06/04/19 3:23 PM

## 2019-06-04 NOTE — NC FL2 (Signed)
[place] MEDICAID FL2 LEVEL OF CARE SCREENING TOOL     IDENTIFICATION  Patient Name: Douglas Mathis Birthdate: [birth year] Sex: male Admission Date (Current Location): 06/02/2019  [place] and [place] Number:  [address] [id number] [facility] Facility and Address:  [facility], [address], [place], [place] [address]      Provider Number: [id number]  Attending Physician Name and Address:  Arnetha Courser, MD  Relative Name and Phone Number:  [name], daughter, [phone]    Current Level of Care: Hospital Recommended Level of Care: Skilled Nursing Facility Prior Approval Number:    Date Approved/Denied: 06/04/19 PASRR Number: [id number]  Discharge Plan: SNF    Current Diagnoses: Patient Active Problem List   Diagnosis Date Noted  . Acute on chronic respiratory failure with hypercapnia (HCC)   . Hyponatremia   . Fall   . Generalized weakness   . Syncope   . Acute respiratory failure with hypoxia and hypercarbia (HCC) 06/03/2019  . Shock (HCC) 06/03/2019  . Chronic diastolic (congestive) heart failure (HCC) 03/31/2018  . HTN (hypertension) 03/31/2018  . Eyelid edema, left 03/31/2018  . COPD (chronic obstructive pulmonary disease) (HCC) 02/26/2018  . COPD exacerbation (HCC) 10/25/2017    Orientation RESPIRATION BLADDER Height & Weight     Self, Place  O2(97, [place], 3L) Incontinent Weight: 70.4 kg Height:  5\' 3"  (160 cm)  BEHAVIORAL SYMPTOMS/MOOD NEUROLOGICAL BOWEL NUTRITION STATUS      Continent Diet(Heart Healthy diet, thin liquid)  AMBULATORY STATUS COMMUNICATION OF NEEDS Skin   Limited Assist Verbally Normal                       Personal Care Assistance Level of Assistance  Bathing, Feeding, Dressing Bathing Assistance: Limited assistance Feeding assistance: Independent Dressing Assistance: Limited assistance     Functional Limitations Info  Sight, Hearing, Speech Sight Info: Adequate Hearing Info: Adequate Speech Info: Adequate     SPECIAL CARE FACTORS FREQUENCY  PT (By licensed PT), OT (By licensed OT)     PT Frequency: 5x/wk OT Frequency: 5x/wk            Contractures Contractures Info: Not present    Additional Factors Info  Code Status, Allergies Code Status Info: DNR Allergies Info: No Known Allergies           Current Medications (06/04/2019):  This is the current hospital active medication list Current Facility-Administered Medications  Medication Dose Route Frequency Provider Last Rate Last Admin  . 0.9 %  sodium chloride infusion (Manually program via Guardrails IV Fluids)   Intravenous Once 08/04/2019, MD      . 0.9 %  sodium chloride infusion  250 mL Intravenous Continuous Arnetha Courser, NP      . acetaminophen (TYLENOL) tablet 650 mg  650 mg Oral Q6H PRN Mansy, Jan A, MD       Or  . acetaminophen (TYLENOL) suppository 650 mg  650 mg Rectal Q6H PRN Mansy, Jan A, MD      . cefTRIAXone (ROCEPHIN) 1 g in sodium chloride 0.9 % 100 mL IVPB  1 g Intravenous Q24H Mansy, Jan A, MD 200 mL/hr at 06/04/19 0514 1 g at 06/04/19 0514  . Chlorhexidine Gluconate Cloth 2 % PADS 6 each  6 each Topical Daily Mansy, 08/04/19, MD   6 each at 06/03/19 [phone]  . enoxaparin (LOVENOX) injection 40 mg  40 mg Subcutaneous Q24H Mansy, Jan A, MD   40 mg at 06/04/19 0503  . erythromycin  ophthalmic ointment   Both Eyes Q8H Lorella Nimrod, MD   1 application at 17/49/44 1308  . feeding supplement (ENSURE ENLIVE) (ENSURE ENLIVE) liquid 237 mL  237 mL Oral BID BM Mansy, Jan A, MD   237 mL at 06/04/19 1308  . ferrous sulfate tablet 325 mg  325 mg Oral BID WC Mansy, Jan A, MD   325 mg at 06/04/19 0856  . ipratropium-albuterol (DUONEB) 0.5-2.5 (3) MG/3ML nebulizer solution 3 mL  3 mL Nebulization QID Mansy, Jan A, MD   3 mL at 06/04/19 1132  . magnesium hydroxide (MILK OF MAGNESIA) suspension 30 mL  30 mL Oral Daily PRN Mansy, Jan A, MD      . MEDLINE mouth rinse  15 mL Mouth Rinse BID Mansy, Jan A, MD   15 mL at 06/04/19 0859   . mometasone-formoterol (DULERA) 100-5 MCG/ACT inhaler 2 puff  2 puff Inhalation BID Lorella Nimrod, MD   2 puff at 06/04/19 0856  . montelukast (SINGULAIR) tablet 10 mg  10 mg Oral Daily Mansy, Jan A, MD   10 mg at 06/04/19 0855  . ondansetron (ZOFRAN) tablet 4 mg  4 mg Oral Q6H PRN Mansy, Jan A, MD       Or  . ondansetron [facility]) injection 4 mg  4 mg Intravenous Q6H PRN Mansy, Jan A, MD      . pantoprazole (PROTONIX) EC tablet 40 mg  40 mg Oral BID Mansy, Jan A, MD   40 mg at 06/04/19 0856  . pravastatin (PRAVACHOL) tablet 40 mg  40 mg Oral q1800 Mansy, Jan A, MD   40 mg at 06/03/19 1903  . tamsulosin (FLOMAX) capsule 0.4 mg  0.4 mg Oral QPC breakfast Mansy, Jan A, MD   0.4 mg at 06/04/19 0855  . traZODone (DESYREL) tablet 25 mg  25 mg Oral QHS PRN Mansy, Arvella Merles, MD         Discharge Medications: Please see discharge summary for a list of discharge medications.  Relevant Imaging Results:  Relevant Lab Results:   Additional Information SSNL [id number]; COVID negative on 06/02/19  Alyson Ki B Renette Hsu, LCSWA

## 2019-06-04 NOTE — Progress Notes (Signed)
*  PRELIMINARY RESULTS* Echocardiogram 2D Echocardiogram has been performed.  Douglas Mathis Douglas Mathis 06/04/2019, 10:37 AM

## 2019-06-04 NOTE — Progress Notes (Addendum)
PROGRESS NOTE    Douglas Mathis  BTD:[id number] DOB: [birth year] DOA: 06/02/2019 PCP: [facility]   Brief Narrative:  [name] a84 y.o.African-American malewith a known history of hypertension, dyslipidemia, COPD, chronic respiratory failure on home O2 at 3 L/min, CHF and BPH, who presented to the emergency room with acute onset of altered mental status and generalized weakness with low blood pressure and recent syncope twice. He denied any chest pain or palpitations. Patient was having dyspnea with associated mild cough and wheezing in the ER. No fever or chills. No nausea vomiting or abdominal pain or diarrhea. No melena or bright red bleeding per rectum or other bleeding diathesis. He denied dysuria, oliguria or hematuria or flank pain.  Upon presentation to the emergency room, vital signs were within normal and later blood pressure was 89/38 with a temperature of 96.9 respiratory to 21. Blood pressures improved to 115/42 and later was 99/43 with a pulse of 62. Labs revealed a venous blood gas with pH 7.26, PCO2 of 87 and PO2 35 with bicarbonate of 39. Influenza antigens and COVID-19 PCR came back negative. CMP was remarkable for hyponatremia and hypochloremia with a potassium of 5.1 blood glucose of 204 and BUN of 28 with creatinine of 1.07. CK was 95 and CBC showed leukocytosis of 12.4 with anemia and urinalysis showed 6-10 WBCs with 11-20 RBCs and hyaline casts and rare bacteria. Urine culture was sent. Noncontrasted head CT scan revealed no acute intracranial normalities. It showed atrophy and mild chronic small vessel ischemic changes of the white matter. Chest x-ray showed chronic appearing increased interstitial lung markings with mild stable left basal atelectasis. EKG showed sinus rhythm with rate of 70 with first-degree AV block, left axis deviation and right bundle branch block. Initially managed with BiPAP in stepdown.  Able to weaned  off from BiPAP pretty quickly and now saturating well on his home oxygen of 3 L. PT/OT recommending SNF placement.  Subjective: Patient has no new complaints today.  Daughter was concerned that he is unable to take care of himself at home and would like to get some home health services.  Assessment & Plan:   Active Problems:   Acute respiratory failure with hypoxia and hypercarbia (HCC)   Shock (HCC)  Acute on chronic hypoxic and hypercarbic respiratory failure.  Initially managed with BiPAP.  COVID-19 is negative.  Able to wean him off pretty quickly to his home oxygen requirement of 3 L. Blood culture remain negative. Continue to monitor.  Recurrent syncope, like secondary to orthostatic hypotension.  Orthostatic vitals were negative yesterday. Echocardiogram done-pending results. US carotid bilateral with bilateral carotid bifurcation plaques without any demonstration of high-grade stenosis. PT/OT are recommending SNF placement.  UTI.  Urine cultures are growing Klebsiella pneumonia. -Continue ceftriaxone-we will de-escalate once susceptibility report is available.  Conjunctivitis.  Patient was having purulent drainage from both eyes, right worse than left.  Denies any pain. -Erythromycin eyedrops.  Iron Deficiency anemia.  Hemoglobin dropped to 7.2 this morning.  Baseline between 8-9.  No obvious bleeding. -Check FOBT. -Transfuse with 1 unit. -Monitor. -Start him on iron supplement  Hyponatremia and hypochloremia.  Resolved.  Most likely secondary to hypovolemia. -Continue to monitor.  COPD.  No concern for acute exacerbation.  No wheezing. -Continue Trelegy and as needed DuoNeb.  Hypertension. -Continue Lopressor  Dyslipidemia. -Continue statin  BPH. -Continue Flomax  Objective: Vitals:   06/03/19 1712 06/03/19 1714 06/03/19 2226 06/04/19 1157  BP: 118/85 (!) 127/41 (!) 110/44 Marland Kitchen)  120/42  Pulse: 99 84 73 81  Resp:   20 20  Temp:   99.5 F (37.5 C) 98.6 F  (37 C)  TempSrc:   Oral Oral  SpO2:   90% 97%  Weight:      Height:        Intake/Output Summary (Last 24 hours) at 06/04/2019 1442 Last data filed at 06/04/2019 1345 Gross per 24 hour  Intake 960 ml  Output 1600 ml  Net -640 ml   Filed Weights   06/02/19 2234 06/03/19 0615  Weight: 72 kg 70.4 kg    Examination:  General exam: Appears calm and comfortable.  Some purulent drainage from both eyes, right little worse than left. Respiratory system: Clear to auscultation. Respiratory effort normal. Cardiovascular system: S1 & S2 heard, RRR. No JVD, murmurs, rubs, gallops or clicks. Gastrointestinal system: Soft, nontender, nondistended, bowel sounds positive. Central nervous system: Alert and oriented. No focal neurological deficits. Extremities: No edema, no cyanosis, pulses intact and symmetrical. Skin: No rashes, lesions or ulcers Psychiatry: Judgement and insight appear normal.    DVT prophylaxis: Lovenox. Code Status: DNR Family Communication: Daughter was updated on phone Disposition Plan:  Status is: Inpatient  Remains inpatient appropriate because:Unsafe d/c plan   Dispo: The patient is from: Home              Anticipated d/c is to: SNF              Anticipated d/c date is: 1 day              Patient currently is medically stable to d/c.  Pending bed placement and insurance approval.  Consultants:   None  Procedures:  Antimicrobials:  Ceftriaxone  Data Reviewed: I have personally reviewed following labs and imaging studies  CBC: Recent Labs  Lab 06/02/19 2237 06/03/19 0820 06/04/19 0706  WBC 12.4* 10.7* 11.7*  NEUTROABS 10.3*  --   --   HGB 8.2* 7.3* 7.2*  HCT 25.9* 23.8* 24.2*  MCV 89.0 89.1 93.1  PLT 233 216 287   Basic Metabolic Panel: Recent Labs  Lab 06/02/19 2237 06/03/19 0820  NA 128* 131*  K 5.1 4.4  CL 85* 91*  CO2 33* 33*  GLUCOSE 204* 104*  BUN 28* 24*  CREATININE 1.07 0.87  CALCIUM 8.7* 8.3*   GFR: Estimated Creatinine  Clearance: 57.7 mL/min (by C-G formula based on SCr of 0.87 mg/dL). Liver Function Tests: Recent Labs  Lab 06/02/19 2237  AST 33  ALT 23  ALKPHOS 55  BILITOT 0.6  PROT 6.7  ALBUMIN 3.8   No results for input(s): LIPASE, AMYLASE in the last 168 hours. No results for input(s): AMMONIA in the last 168 hours. Coagulation Profile: No results for input(s): INR, PROTIME in the last 168 hours. Cardiac Enzymes: Recent Labs  Lab 06/02/19 2237  CKTOTAL 95   BNP (last 3 results) No results for input(s): PROBNP in the last 8760 hours. HbA1C: No results for input(s): HGBA1C in the last 72 hours. CBG: Recent Labs  Lab 06/02/19 2254  GLUCAP 181*   Lipid Profile: No results for input(s): CHOL, HDL, LDLCALC, TRIG, CHOLHDL, LDLDIRECT in the last 72 hours. Thyroid Function Tests: Recent Labs    06/03/19 0807  TSH 0.580   Anemia Panel: Recent Labs    06/04/19 0706  FOLATE 17.3  FERRITIN 20*  TIBC 340  IRON 24*  RETICCTPCT 5.9*   Sepsis Labs: Recent Labs  Lab 06/03/19 0820 06/04/19 0706  PROCALCITON <0.10 <  0.10    Recent Results (from the past 240 hour(s))  Urine Culture     Status: Abnormal (Preliminary result)   Collection Time: 06/02/19 10:37 PM   Specimen: Urine, Random  Result Value Ref Range Status   Specimen Description   Final    URINE, RANDOM Performed at [facility], [address] [place], [place] [address]    Special Requests   Final    NONE Performed at [facility], [address] [place], [place] [address]    Culture (A)  Final    >=100,000 COLONIES/mL KLEBSIELLA PNEUMONIAE SUSCEPTIBILITIES TO FOLLOW Performed at [facility], [address], [place], [place] [address]    Report Status PENDING  Incomplete  Respiratory Panel by RT PCR (Flu A&B, Covid) - Nasopharyngeal Swab     Status: None   Collection Time: 06/02/19 11:37 PM   Specimen: Nasopharyngeal Swab  Result Value Ref Range Status   SARS Coronavirus 2 by RT  PCR NEGATIVE NEGATIVE Final    Comment: (NOTE) SARS-CoV-2 target nucleic acids are NOT DETECTED. The SARS-CoV-2 RNA is generally detectable in upper respiratoy specimens during the acute phase of infection. The lowest concentration of SARS-CoV-2 viral copies this assay can detect is 131 copies/mL. A negative result does not preclude SARS-Cov-2 infection and should not be used as the sole basis for treatment or other patient management decisions. A negative result may occur with  improper specimen collection/handling, submission of specimen other than nasopharyngeal swab, presence of viral mutation(s) within the areas targeted by this assay, and inadequate number of viral copies (<131 copies/mL). A negative result must be combined with clinical observations, patient history, and epidemiological information. The expected result is Negative. Fact Sheet for Patients:  https://www.moore.com/ Fact Sheet for Healthcare Providers:  https://www.young.biz/ This test is not yet ap proved or cleared by the [place] FDA and  has been authorized for detection and/or diagnosis of SARS-CoV-2 by FDA under an Emergency Use Authorization (EUA). This EUA will remain  in effect (meaning this test can be used) for the duration of the COVID-19 declaration under Section 564(b)(1) of the Act, 21 U.S.C. section 360bbb-3(b)(1), unless the authorization is terminated or revoked sooner.    Influenza A by PCR NEGATIVE NEGATIVE Final   Influenza B by PCR NEGATIVE NEGATIVE Final    Comment: (NOTE) The Xpert Xpress SARS-CoV-2/FLU/RSV assay is intended as an aid in  the diagnosis of influenza from Nasopharyngeal swab specimens and  should not be used as a sole basis for treatment. Nasal washings and  aspirates are unacceptable for Xpert Xpress SARS-CoV-2/FLU/RSV  testing. Fact Sheet for Patients: https://www.moore.com/ Fact Sheet for Healthcare  Providers: https://www.young.biz/ This test is not yet approved or cleared by the [place] FDA and  has been authorized for detection and/or diagnosis of SARS-CoV-2 by  FDA under an Emergency Use Authorization (EUA). This EUA will remain  in effect (meaning this test can be used) for the duration of the  Covid-19 declaration under Section 564(b)(1) of the Act, 21  U.S.C. section 360bbb-3(b)(1), unless the authorization is  terminated or revoked. Performed at [facility], [address] [place], [place] [address]   CULTURE, BLOOD (ROUTINE X 2) w Reflex to ID Panel     Status: None (Preliminary result)   Collection Time: 06/02/19 11:37 PM   Specimen: BLOOD  Result Value Ref Range Status   Specimen Description BLOOD RT [facility]  Final   Special Requests   Final  BOTTLES DRAWN AEROBIC AND ANAEROBIC Blood Culture results may not be optimal due to an excessive volume of blood received in culture bottles   Culture   Final    NO GROWTH 1 DAY Performed at [facility], [address] [place], [place] [address]    Report Status PENDING  Incomplete  CULTURE, BLOOD (ROUTINE X 2) w Reflex to ID Panel     Status: None (Preliminary result)   Collection Time: 06/02/19 11:37 PM   Specimen: BLOOD  Result Value Ref Range Status   Specimen Description BLOOD LEFT WRIST  Final   Special Requests   Final    BOTTLES DRAWN AEROBIC AND ANAEROBIC Blood Culture results may not be optimal due to an inadequate volume of blood received in culture bottles   Culture   Final    NO GROWTH 1 DAY Performed at [facility], [address] [place], [place] [address]    Report Status PENDING  Incomplete  MRSA PCR Screening     Status: None   Collection Time: 06/03/19  5:30 AM   Specimen: Nasopharyngeal  Result Value Ref Range Status   MRSA by PCR NEGATIVE NEGATIVE Final    Comment:        The GeneXpert MRSA Assay (FDA approved for NASAL specimens only), is  one component of a comprehensive MRSA colonization surveillance program. It is not intended to diagnose MRSA infection nor to guide or monitor treatment for MRSA infections. Performed at [facility], [address], [place], [place] [address]      Radiology Studies: CT Head Wo Contrast  Result Date: 06/03/2019 CLINICAL DATA:  Encephalopathy fall EXAM: CT HEAD WITHOUT CONTRAST TECHNIQUE: Contiguous axial images were obtained from the base of the skull through the vertex without intravenous contrast. COMPARISON:  CT brain 06/10/2016 FINDINGS: Brain: No acute territorial infarction, hemorrhage, or intracranial mass. Moderate atrophy. Minimal hypodensity in the periventricular white matter likely chronic small vessel ischemic change. Stable ventricle size Vascular: No hyperdense vessels.  Carotid vascular calcification Skull: Normal. Negative for fracture or focal lesion. Sinuses/Orbits: Mucosal thickening in the ethmoid sinuses Other: None IMPRESSION: 1. No CT evidence for acute intracranial abnormality. 2. Atrophy and mild chronic small vessel ischemic change of the white matter Electronically Signed   By: Jasmine Pang M.D.   On: 06/03/2019 00:35   US Carotid Bilateral  Result Date: 06/03/2019 CLINICAL DATA:  Syncope, altered mental status. Hypertension, hyperlipidemia, previous tobacco abuse EXAM: BILATERAL CAROTID DUPLEX ULTRASOUND TECHNIQUE: Wallace Cullens scale imaging, color Doppler and duplex ultrasound were performed of bilateral carotid and vertebral arteries in the neck. Technologist describes technically difficult study secondary to patient motion and breathing. COMPARISON:  None. FINDINGS: Criteria: Quantification of carotid stenosis is based on velocity parameters that correlate the residual internal carotid diameter with NASCET-based stenosis levels, using the diameter of the distal internal carotid lumen as the denominator for stenosis measurement. The following velocity measurements  were obtained: RIGHT ICA: 109/29 cm/sec CCA: 90/20 cm/sec SYSTOLIC ICA/CCA RATIO:  1.2 ECA: 58 cm/sec LEFT ICA: 136/49 cm/sec CCA: 49/13 cm/sec SYSTOLIC ICA/CCA RATIO:  2.8 ECA: 108 cm/sec RIGHT CAROTID ARTERY: Mild tortuosity. Eccentric calcified plaque in the distal common carotid artery and bulb, resulting in at least mild stenosis. Calcified plaque in the proximal ICA. Limited quality waveforms and color Doppler evaluation of the proximal and mid ICA. Normal flow signal in the distal ICA. RIGHT VERTEBRAL ARTERY:  Normal flow direction and waveform. LEFT CAROTID ARTERY: Calcified plaque in the common  carotid artery and bulb without high-grade stenosis. Calcified plaque at the origin of the ICA resulting in some distal acoustic shadowing. Normal waveforms and color Doppler signal. LEFT VERTEBRAL ARTERY:  Normal flow direction and waveform. IMPRESSION: 1. Bilateral carotid bifurcation plaque without demonstration high-grade stenosis. Limited evaluation of the proximal right ICA. Consider follow-up study when the patient is more able to cooperate, or CTA for further characterization. 2.  Antegrade bilateral vertebral arterial flow. Electronically Signed   By: Corlis Leak M.D.   On: 06/03/2019 12:48   DG Chest Portable 1 View  Result Date: 06/02/2019 CLINICAL DATA:  Status post fall. EXAM: PORTABLE CHEST 1 VIEW COMPARISON:  April 01, 2019 FINDINGS: Mild to moderate severity diffuse chronic appearing increased interstitial lung markings are seen. Mild, stable atelectasis is seen along the lateral aspect of the left lung base. There is no evidence of a pleural effusion or pneumothorax. The heart size and mediastinal contours are within normal limits. There is marked severity calcification of the aortic arch. Multilevel degenerative changes are noted throughout the thoracic spine. IMPRESSION: Chronic-appearing increased interstitial lung markings with mild, stable left basilar atelectasis. Electronically Signed    By: Aram Candela M.D.   On: 06/02/2019 23:46    Scheduled Meds: . Chlorhexidine Gluconate Cloth  6 each Topical Daily  . enoxaparin (LOVENOX) injection  40 mg Subcutaneous Q24H  . erythromycin   Both Eyes Q8H  . feeding supplement (ENSURE ENLIVE)  237 mL Oral BID BM  . ferrous sulfate  325 mg Oral BID WC  . ipratropium-albuterol  3 mL Nebulization QID  . mouth rinse  15 mL Mouth Rinse BID  . mometasone-formoterol  2 puff Inhalation BID  . montelukast  10 mg Oral Daily  . pantoprazole  40 mg Oral BID  . pravastatin  40 mg Oral q1800  . tamsulosin  0.4 mg Oral QPC breakfast   Continuous Infusions: . sodium chloride    . cefTRIAXone (ROCEPHIN)  IV 1 g (06/04/19 0514)     LOS: 1 day   Time spent: 40 minutes.  Arnetha Courser, MD [facility]  If 7PM-7AM, please contact night-coverage [email or web address]  06/04/2019, 2:43 PM   This record has been created using Conservation officer, historic buildings. Errors have been sought and corrected,but may not always be located. Such creation errors do not reflect on the standard of care.

## 2019-06-04 NOTE — TOC Initial Note (Signed)
Transition of Care [facility]) - Initial/Assessment Note    Patient Details  Name: Douglas Mathis MRN: [id number] Date of Birth: 02/04/1936  Transition [facility]) CM/SW Contact:    Douglas Mathis, LCSWA Phone Number: 06/04/2019, 3:35 PM  Clinical Narrative:                  Patient has been recommended for SNF by physical therapy. CSW called and spoke with the patient's daughter, Douglas Mathis. CSW introduced herself, shared therapy recommendation, and explained purpose of the call.   Patient's daughter is in agreement with SNF. She stated she had spoken with Dr. Nelson Mathis. Patient's daughter does not want her father to go to [facility], [facility], or [facility]. She explained that she works in Teacher, music and has worked for these facilities. She stated she would prefer her father to go to [employer] or [place]. CSW obtained permission to fax the patient out and provided CMS SNF copy via email.   CSW will continue to follow and assist with disposition planning.    Expected Discharge Plan: Skilled Nursing Facility Barriers to Discharge: Continued Medical Work up   Patient Goals and CMS Choice Patient states their goals for this hospitalization and ongoing recovery are:: Pt daughter wants her father to get better CMS Medicare.gov Compare Post Acute Care list provided to:: Patient Choice offered to / list presented to : Patient  Expected Discharge Plan and Services Expected Discharge Plan: Skilled Nursing Facility In-house Referral: Clinical Social Work   Post Acute Care Choice: Skilled Nursing Facility Living arrangements for the past 2 months: Apartment                                      Prior Living Arrangements/Services Living arrangements for the past 2 months: Apartment Lives with:: Self Patient language and need for interpreter reviewed:: No Do you feel safe going back to the place where you live?: Yes      Need for Family Participation in  Patient Care: Yes (Comment) Care giver support system in place?: Yes (comment)   Criminal Activity/Legal Involvement Pertinent to Current Situation/Hospitalization: No - Comment as needed  Activities of Daily Living Home Assistive Devices/Equipment: Walker (specify type) ADL Screening (condition at time of admission) Patient's cognitive ability adequate to safely complete daily activities?: No Is the patient deaf or have difficulty hearing?: No Does the patient have difficulty seeing, even when wearing glasses/contacts?: No Does the patient have difficulty concentrating, remembering, or making decisions?: No Patient able to express need for assistance with ADLs?: Yes Does the patient have difficulty dressing or bathing?: No Independently performs ADLs?: Yes (appropriate for developmental age) Does the patient have difficulty walking or climbing stairs?: Yes Weakness of Legs: Both Weakness of Arms/Hands: None  Permission Sought/Granted Permission sought to share information with : Case Manager Permission granted to share information with : Yes, Verbal Permission Granted  Share Information with NAME: Douglas Mathis  Permission granted to share info w AGENCY: All SNF  Permission granted to share info w Relationship: Daughter     Emotional Assessment Appearance:: Appears stated age     Orientation: : Oriented to Self, Oriented to Place Alcohol / Substance Use: Not Applicable Psych Involvement: No (comment)  Admission diagnosis:  Hyponatremia [E87.1] Shock (HCC) [R57.9] Syncope [R55] Generalized weakness [R53.1] Fall, initial encounter [W19.XXXA] Acute on chronic respiratory failure with hypercapnia (HCC) [J96.22]  Acute respiratory failure with hypoxia and hypercarbia (HCC) [J96.01, J96.02] Hypotension, unspecified hypotension type [I95.9] Patient Active Problem List   Diagnosis Date Noted  . Acute on chronic respiratory failure with hypercapnia ([place])   . Hyponatremia   . Fall    . Generalized weakness   . Syncope   . Acute respiratory failure with hypoxia and hypercarbia ([place]) 06/03/2019  . Shock ([place]) 06/03/2019  . Chronic diastolic (congestive) heart failure ([place]) 03/31/2018  . HTN (hypertension) 03/31/2018  . Eyelid edema, left 03/31/2018  . COPD (chronic obstructive pulmonary disease) ([place]) 02/26/2018  . COPD exacerbation ([place]) 10/25/2017   PCP:  [facility] [place]:   [place], [place], [place] [address] [place] [address] Phone: [phone] Fax: [place], [place] - [address] [place] [address] Phone: [phone] Fax: [phone]     Social Determinants of Health (SDOH) Interventions    Readmission Risk Interventions No flowsheet data found.

## 2019-06-05 LAB — BASIC METABOLIC PANEL
Anion gap: 8 (ref 5–15)
BUN: 18 mg/dL (ref 8–23)
CO2: 34 mmol/L — ABNORMAL HIGH (ref 22–32)
Calcium: 8.5 mg/dL — ABNORMAL LOW (ref 8.9–10.3)
Chloride: 87 mmol/L — ABNORMAL LOW (ref 98–111)
Creatinine, Ser: 0.77 mg/dL (ref 0.61–1.24)
GFR calc Af Amer: >60 mL/min (ref >60)
GFR calc non Af Amer: >60 mL/min (ref >60)
Glucose, Bld: 110 mg/dL — ABNORMAL HIGH (ref 70–99)
Potassium: 4.5 mmol/L (ref 3.5–5.1)
Sodium: 129 mmol/L — ABNORMAL LOW (ref 135–145)

## 2019-06-05 LAB — CBC
HCT: 25.6 % — ABNORMAL LOW (ref 39.0–52.0)
HCT: 26.1 % — ABNORMAL LOW (ref 39.0–52.0)
Hemoglobin: 8.2 g/dL — ABNORMAL LOW (ref 13.0–17.0)
Hemoglobin: 8.2 g/dL — ABNORMAL LOW (ref 13.0–17.0)
MCH: 28.4 pg (ref 26.0–34.0)
MCH: 28.6 pg (ref 26.0–34.0)
MCHC: 31.4 g/dL (ref 30.0–36.0)
MCHC: 32 g/dL (ref 30.0–36.0)
MCV: 89.2 fL (ref 80.0–100.0)
MCV: 90.3 fL (ref 80.0–100.0)
Platelets: 204 10*3/uL (ref 150–400)
Platelets: 209 10*3/uL (ref 150–400)
RBC: 2.87 MIL/uL — ABNORMAL LOW (ref 4.22–5.81)
RBC: 2.89 MIL/uL — ABNORMAL LOW (ref 4.22–5.81)
RDW: 18.1 % — ABNORMAL HIGH (ref 11.5–15.5)
RDW: 18.2 % — ABNORMAL HIGH (ref 11.5–15.5)
WBC: 13 10*3/uL — ABNORMAL HIGH (ref 4.0–10.5)
WBC: 13.9 10*3/uL — ABNORMAL HIGH (ref 4.0–10.5)
nRBC: 0 % (ref 0.0–0.2)
nRBC: 0 % (ref 0.0–0.2)

## 2019-06-05 LAB — TYPE AND SCREEN
ABO/RH(D): O POS
Antibody Screen: NEGATIVE
Unit division: 0

## 2019-06-05 LAB — BPAM RBC
Blood Product Expiration Date: 202104142359
ISSUE DATE / TIME: 202104111803
Unit Type and Rh: 9500

## 2019-06-05 LAB — PROCALCITONIN: Procalcitonin: <0.1 ng/mL

## 2019-06-05 LAB — URINE CULTURE: Culture: >=100000 — AB

## 2019-06-05 LAB — SARS CORONAVIRUS 2 (TAT 6-24 HRS): SARS Coronavirus 2: NEGATIVE

## 2019-06-05 MED ORDER — ENSURE ENLIVE PO LIQD
237.0000 mL | Freq: Three times a day (TID) | ORAL | Status: DC
  Administered 2019-06-05 – 2019-06-06 (×3): 237 mL via ORAL

## 2019-06-05 MED ORDER — CEPHALEXIN 500 MG PO CAPS
500.0000 mg | ORAL_CAPSULE | Freq: Three times a day (TID) | ORAL | Status: DC
  Administered 2019-06-05 – 2019-06-06 (×5): 500 mg via ORAL
  Filled 2019-06-05 (×5): qty 1

## 2019-06-05 MED ORDER — SODIUM CHLORIDE 0.9 % IV SOLN: INTRAVENOUS | Status: DC

## 2019-06-05 MED ORDER — AMLODIPINE BESYLATE 10 MG PO TABS
10.0000 mg | ORAL_TABLET | Freq: Every day | ORAL | Status: DC
  Administered 2019-06-05: 10 mg via ORAL
  Filled 2019-06-05 (×2): qty 1

## 2019-06-05 MED ORDER — ADULT MULTIVITAMIN W/MINERALS CH
1.0000 | ORAL_TABLET | Freq: Every day | ORAL | Status: DC
  Administered 2019-06-06: 1 via ORAL
  Filled 2019-06-05: qty 1

## 2019-06-05 MED ORDER — CARVEDILOL 6.25 MG PO TABS
6.2500 mg | ORAL_TABLET | Freq: Two times a day (BID) | ORAL | Status: DC
  Administered 2019-06-05 (×2): 6.25 mg via ORAL
  Filled 2019-06-05 (×3): qty 1

## 2019-06-05 MED ORDER — LISINOPRIL 10 MG PO TABS
5.0000 mg | ORAL_TABLET | Freq: Every day | ORAL | Status: DC
  Administered 2019-06-05: 5 mg via ORAL
  Filled 2019-06-05 (×2): qty 1

## 2019-06-05 MED ORDER — FLUTICASONE FUROATE-VILANTEROL 100-25 MCG/INH IN AEPB
1.0000 | INHALATION_SPRAY | Freq: Every day | RESPIRATORY_TRACT | Status: DC
  Administered 2019-06-05: 1 via RESPIRATORY_TRACT
  Filled 2019-06-05: qty 28

## 2019-06-05 MED ORDER — ALBUTEROL SULFATE (2.5 MG/3ML) 0.083% IN NEBU: 3.0000 mL | INHALATION_SOLUTION | Freq: Four times a day (QID) | RESPIRATORY_TRACT | Status: DC | PRN

## 2019-06-05 MED ORDER — FLUTICASONE-UMECLIDIN-VILANT 100-62.5-25 MCG/INH IN AEPB: 1.0000 | INHALATION_SPRAY | Freq: Every day | RESPIRATORY_TRACT | Status: DC

## 2019-06-05 MED ORDER — UMECLIDINIUM BROMIDE 62.5 MCG/INH IN AEPB
1.0000 | INHALATION_SPRAY | Freq: Every day | RESPIRATORY_TRACT | Status: DC
  Administered 2019-06-05: 1 via RESPIRATORY_TRACT
  Filled 2019-06-05: qty 7

## 2019-06-05 NOTE — Progress Notes (Signed)
PROGRESS NOTE    Douglas Mathis  ZOX:[id number] DOB: 04/28/1935 DOA: 06/02/2019 PCP: [facility]   Brief Narrative:  Douglas Mathis a84 y.o.African-American malewith a known history of hypertension, dyslipidemia, COPD, chronic respiratory failure on home O2 at 3 L/min, CHF and BPH, who presented to the emergency room with acute onset of altered mental status and generalized weakness with low blood pressure and recent syncope twice. He denied any chest pain or palpitations. Patient was having dyspnea with associated mild cough and wheezing in the ER. No fever or chills. No nausea vomiting or abdominal pain or diarrhea. No melena or bright red bleeding per rectum or other bleeding diathesis. He denied dysuria, oliguria or hematuria or flank pain.  Upon presentation to the emergency room, vital signs were within normal and later blood pressure was 89/38 with a temperature of 96.9 respiratory to 21. Blood pressures improved to 115/42 and later was 99/43 with a pulse of 62. Labs revealed a venous blood gas with pH 7.26, PCO2 of 87 and PO2 35 with bicarbonate of 39. Influenza antigens and COVID-19 PCR came back negative. CMP was remarkable for hyponatremia and hypochloremia with a potassium of 5.1 blood glucose of 204 and BUN of 28 with creatinine of 1.07. CK was 95 and CBC showed leukocytosis of 12.4 with anemia and urinalysis showed 6-10 WBCs with 11-20 RBCs and hyaline casts and rare bacteria. Urine culture was sent. Noncontrasted head CT scan revealed no acute intracranial normalities. It showed atrophy and mild chronic small vessel ischemic changes of the white matter. Chest x-ray showed chronic appearing increased interstitial lung markings with mild stable left basal atelectasis. EKG showed sinus rhythm with rate of 70 with first-degree AV block, left axis deviation and right bundle branch block. Initially managed with BiPAP in stepdown.  Able to weaned  off from BiPAP pretty quickly and now saturating well on his home oxygen of 3 L. PT/OT recommending SNF placement.  Subjective: Patient has no new complaints today.   Assessment & Plan:   Active Problems:   Acute respiratory failure with hypoxia and hypercarbia (HCC)   Shock (HCC)   Acute on chronic respiratory failure with hypercapnia (HCC)   Hyponatremia   Fall   Generalized weakness   Syncope  Acute on chronic hypoxic and hypercarbic respiratory failure.  Initially managed with BiPAP.  COVID-19 is negative.  Able to wean him off pretty quickly to his home oxygen requirement of 3 L. Blood culture remain negative. Continue to monitor.  Recurrent syncope, like secondary to orthostatic hypotension.  Orthostatic vitals were negative yesterday. Echocardiogram-within normal limits. US carotid bilateral with bilateral carotid bifurcation plaques without any demonstration of high-grade stenosis. PT/OT are recommending SNF placement.  UTI.  Urine cultures are growing Klebsiella pneumonia. -Switch ceftriaxone with Keflex according to sensitivity results, complete a 7-day course.  Conjunctivitis.  Patient was having purulent drainage from both eyes, right worse than left yesterday, seems improving today. denies any pain. -Continue erythromycin eyedrops.  Iron Deficiency anemia.  Hemoglobin improved to 8.2 after 1 unit of PRBC.  Baseline between 8-9.  No obvious bleeding. -Check FOBT-not done yet -Monitor. -Start him on iron supplement  Hyponatremia and hypochloremia.  Patient sodium decreased to 129 with chloride of 87 again today.  Most likely secondary to dehydration as patient appears dry. -Him on normal saline 100 mL/h for 24-hour. -Continue to monitor.  COPD.  No concern for acute exacerbation.  No wheezing. -Continue Trelegy and as needed DuoNeb.  Hypertension.  Blood pressure elevated today. -Home antihypertensives which include amlodipine, low-dose Coreg and  lisinopril.  Dyslipidemia. -Continue statin  BPH. -Continue Flomax  Objective: Vitals:   06/05/19 0438 06/05/19 0441 06/05/19 0443 06/05/19 0731  BP: (!) 123/100 (!) 131/100 (!) 153/61 (!) 150/58  Pulse:  78 80 74  Resp:    16  Temp:    98.5 F (36.9 C)  TempSrc:    Oral  SpO2:  92% 93% 99%  Weight:      Height:        Intake/Output Summary (Last 24 hours) at 06/05/2019 1322 Last data filed at 06/05/2019 1113 Gross per 24 hour  Intake 1035 ml  Output 1525 ml  Net -490 ml   Filed Weights   06/02/19 2234 06/03/19 0615  Weight: 72 kg 70.4 kg    Examination:  General exam: Appears calm and comfortable.  Some purulent drainage from both eyes, right little worse than left, improved as compared to yesterday. Respiratory system: Clear to auscultation. Respiratory effort normal. Cardiovascular system: S1 & S2 heard, RRR. No JVD, murmurs, rubs, gallops or clicks. Gastrointestinal system: Soft, nontender, nondistended, bowel sounds positive. Central nervous system: Alert and oriented. No focal neurological deficits. Extremities: No edema, no cyanosis, pulses intact and symmetrical. Psychiatry: Judgement and insight appear normal.    DVT prophylaxis: Lovenox. Code Status: DNR Family Communication: Daughter was updated on phone Disposition Plan:  Status is: Inpatient  Remains inpatient appropriate because:Unsafe d/c plan   Dispo: The patient is from: Home              Anticipated d/c is to: SNF              Anticipated d/c date is: 1 day              Patient currently is medically stable to d/c.  Pending bed placement and insurance approval.  Hopefully should be able to go tomorrow as he has a bed offer pending insurance authorization  Consultants:   None  Procedures:  Antimicrobials:  Keflex  Data Reviewed: I have personally reviewed following labs and imaging studies  CBC: Recent Labs  Lab 06/02/19 2237 06/03/19 0820 06/04/19 0706 06/04/19 2357  06/05/19 0606  WBC 12.4* 10.7* 11.7* 13.9* 13.0*  NEUTROABS 10.3*  --   --   --   --   HGB 8.2* 7.3* 7.2* 8.2* 8.2*  HCT 25.9* 23.8* 24.2* 25.6* 26.1*  MCV 89.0 89.1 93.1 89.2 90.3  PLT 233 216 243 204 175   Basic Metabolic Panel: Recent Labs  Lab 06/02/19 2237 06/03/19 0820 06/05/19 0606  NA 128* 131* 129*  K 5.1 4.4 4.5  CL 85* 91* 87*  CO2 33* 33* 34*  GLUCOSE 204* 104* 110*  BUN 28* 24* 18  CREATININE 1.07 0.87 0.77  CALCIUM 8.7* 8.3* 8.5*   GFR: Estimated Creatinine Clearance: 62.7 mL/min (by C-G formula based on SCr of 0.77 mg/dL). Liver Function Tests: Recent Labs  Lab 06/02/19 2237  AST 33  ALT 23  ALKPHOS 55  BILITOT 0.6  PROT 6.7  ALBUMIN 3.8   No results for input(s): LIPASE, AMYLASE in the last 168 hours. No results for input(s): AMMONIA in the last 168 hours. Coagulation Profile: No results for input(s): INR, PROTIME in the last 168 hours. Cardiac Enzymes: Recent Labs  Lab 06/02/19 2237  CKTOTAL 95   BNP (last 3 results) No results for input(s): PROBNP in the last 8760 hours. HbA1C: No results for input(s): HGBA1C in the  last 72 hours. CBG: Recent Labs  Lab 06/02/19 2254  GLUCAP 181*   Lipid Profile: No results for input(s): CHOL, HDL, LDLCALC, TRIG, CHOLHDL, LDLDIRECT in the last 72 hours. Thyroid Function Tests: Recent Labs    06/03/19 0807  TSH 0.580   Anemia Panel: Recent Labs    06/04/19 0706 06/04/19 1350  VITAMINB12  --  684  FOLATE 17.3  --   FERRITIN 20*  --   TIBC 340  --   IRON 24*  --   RETICCTPCT 5.9*  --    Sepsis Labs: Recent Labs  Lab 06/03/19 0820 06/04/19 0706 06/05/19 0606  PROCALCITON <0.10 <0.10 <0.10    Recent Results (from the past 240 hour(s))  Urine Culture     Status: Abnormal   Collection Time: 06/02/19 10:37 PM   Specimen: Urine, Random  Result Value Ref Range Status   Specimen Description   Final    URINE, RANDOM Performed at [facility], [address] [place],  [place] [address]    Special Requests   Final    NONE Performed at [facility], [address] [place], [place] [address]    Culture >=100,000 COLONIES/mL KLEBSIELLA PNEUMONIAE (A)  Final   Report Status 06/05/2019 FINAL  Final   Organism ID, Bacteria KLEBSIELLA PNEUMONIAE (A)  Final      Susceptibility   Klebsiella pneumoniae - MIC*    AMPICILLIN RESISTANT Resistant     CEFAZOLIN <=4 SENSITIVE Sensitive     CEFTRIAXONE <=0.25 SENSITIVE Sensitive     CIPROFLOXACIN <=0.25 SENSITIVE Sensitive     GENTAMICIN <=1 SENSITIVE Sensitive     IMIPENEM <=0.25 SENSITIVE Sensitive     NITROFURANTOIN 32 SENSITIVE Sensitive     TRIMETH/SULFA <=20 SENSITIVE Sensitive     AMPICILLIN/SULBACTAM <=2 SENSITIVE Sensitive     PIP/TAZO <=4 SENSITIVE Sensitive     * >=100,000 COLONIES/mL KLEBSIELLA PNEUMONIAE  Respiratory Panel by RT PCR (Flu A&B, Covid) - Nasopharyngeal Swab     Status: None   Collection Time: 06/02/19 11:37 PM   Specimen: Nasopharyngeal Swab  Result Value Ref Range Status   SARS Coronavirus 2 by RT PCR NEGATIVE NEGATIVE Final    Comment: (NOTE) SARS-CoV-2 target nucleic acids are NOT DETECTED. The SARS-CoV-2 RNA is generally detectable in upper respiratoy specimens during the acute phase of infection. The lowest concentration of SARS-CoV-2 viral copies this assay can detect is 131 copies/mL. A negative result does not preclude SARS-Cov-2 infection and should not be used as the sole basis for treatment or other patient management decisions. A negative result may occur with  improper specimen collection/handling, submission of specimen other than nasopharyngeal swab, presence of viral mutation(s) within the areas targeted by this assay, and inadequate number of viral copies (<131 copies/mL). A negative result must be combined with clinical observations, patient history, and epidemiological information. The expected result is Negative. Fact Sheet for Patients:   https://www.moore.com/ Fact Sheet for Healthcare Providers:  https://www.young.biz/ This test is not yet ap proved or cleared by the [place] FDA and  has been authorized for detection and/or diagnosis of SARS-CoV-2 by FDA under an Emergency Use Authorization (EUA). This EUA will remain  in effect (meaning this test can be used) for the duration of the COVID-19 declaration under Section 564(b)(1) of the Act, 21 U.S.C. section 360bbb-3(b)(1), unless the authorization is terminated or revoked sooner.    Influenza A by PCR NEGATIVE NEGATIVE Final   Influenza B by PCR NEGATIVE NEGATIVE Final  Comment: (NOTE) The Xpert Xpress SARS-CoV-2/FLU/RSV assay is intended as an aid in  the diagnosis of influenza from Nasopharyngeal swab specimens and  should not be used as a sole basis for treatment. Nasal washings and  aspirates are unacceptable for Xpert Xpress SARS-CoV-2/FLU/RSV  testing. Fact Sheet for Patients: https://www.moore.com/ Fact Sheet for Healthcare Providers: https://www.young.biz/ This test is not yet approved or cleared by the [place] FDA and  has been authorized for detection and/or diagnosis of SARS-CoV-2 by  FDA under an Emergency Use Authorization (EUA). This EUA will remain  in effect (meaning this test can be used) for the duration of the  Covid-19 declaration under Section 564(b)(1) of the Act, 21  U.S.C. section 360bbb-3(b)(1), unless the authorization is  terminated or revoked. Performed at [facility], [address] [place], [place] [address]   CULTURE, BLOOD (ROUTINE X 2) w Reflex to ID Panel     Status: None (Preliminary result)   Collection Time: 06/02/19 11:37 PM   Specimen: BLOOD  Result Value Ref Range Status   Specimen Description BLOOD RT [facility]  Final   Special Requests   Final    BOTTLES DRAWN AEROBIC AND ANAEROBIC Blood Culture results may not be optimal  due to an excessive volume of blood received in culture bottles   Culture   Final    NO GROWTH 2 DAYS Performed at [facility], [address], [place], [place] [address]    Report Status PENDING  Incomplete  CULTURE, BLOOD (ROUTINE X 2) w Reflex to ID Panel     Status: None (Preliminary result)   Collection Time: 06/02/19 11:37 PM   Specimen: BLOOD  Result Value Ref Range Status   Specimen Description BLOOD LEFT WRIST  Final   Special Requests   Final    BOTTLES DRAWN AEROBIC AND ANAEROBIC Blood Culture results may not be optimal due to an inadequate volume of blood received in culture bottles   Culture   Final    NO GROWTH 2 DAYS Performed at [facility], [address] [place], [place] [address]    Report Status PENDING  Incomplete  MRSA PCR Screening     Status: None   Collection Time: 06/03/19  5:30 AM   Specimen: Nasopharyngeal  Result Value Ref Range Status   MRSA by PCR NEGATIVE NEGATIVE Final    Comment:        The GeneXpert MRSA Assay (FDA approved for NASAL specimens only), is one component of a comprehensive MRSA colonization surveillance program. It is not intended to diagnose MRSA infection nor to guide or monitor treatment for MRSA infections. Performed at [facility], [address] [place], [place] [address]      Radiology Studies: ECHOCARDIOGRAM COMPLETE  Result Date: 06/04/2019    ECHOCARDIOGRAM REPORT   Patient Name:   NARCISSE BOTZ Date of Exam: 06/04/2019 Medical Rec #:  [id number]        Height:       63.0 in Accession #:    [id number]       Weight:       155.2 lb Date of Birth:  [birth year]        BSA:          1.736 m Patient Age:    84 years         BP:           110/44 mmHg Patient Gender: M  HR:           85 bpm. Exam Location:  [facility] Procedure: 2D Echo, Color Doppler and Cardiac Doppler Indications:     R55 Syncope  History:         Patient has prior history of Echocardiogram examinations. CHF,                   COPD; Risk Factors:Hypertension and HCL.  Sonographer:     Humphrey Rolls RDCS (AE) Referring Phys:  [id number] Vernetta Honey MANSY Diagnosing Phys: Arnoldo Hooker MD IMPRESSIONS  1. Left ventricular ejection fraction, by estimation, is 60 to 65%. The left ventricle has normal function. The left ventricle has no regional wall motion abnormalities. Left ventricular diastolic parameters were normal.  2. Right ventricular systolic function is normal. The right ventricular size is normal.  3. The mitral valve is normal in structure. Trivial mitral valve regurgitation.  4. The aortic valve is normal in structure. Aortic valve regurgitation is not visualized. FINDINGS  Left Ventricle: Left ventricular ejection fraction, by estimation, is 60 to 65%. The left ventricle has normal function. The left ventricle has no regional wall motion abnormalities. The left ventricular internal cavity size was normal in size. There is  no left ventricular hypertrophy. Left ventricular diastolic parameters were normal. Right Ventricle: The right ventricular size is normal. No increase in right ventricular wall thickness. Right ventricular systolic function is normal. Left Atrium: Left atrial size was normal in size. Right Atrium: Right atrial size was normal in size. Pericardium: There is no evidence of pericardial effusion. Mitral Valve: The mitral valve is normal in structure. Trivial mitral valve regurgitation. MV peak gradient, 11.0 mmHg. The mean mitral valve gradient is 5.0 mmHg. Tricuspid Valve: The tricuspid valve is normal in structure. Tricuspid valve regurgitation is trivial. Aortic Valve: The aortic valve is normal in structure. Aortic valve regurgitation is not visualized. Aortic valve mean gradient measures 6.0 mmHg. Aortic valve peak gradient measures 11.2 mmHg. Aortic valve area, by VTI measures 3.11 cm. Pulmonic Valve: The pulmonic valve was normal in structure. Pulmonic valve regurgitation is not visualized. Aorta: The aortic  root and ascending aorta are structurally normal, with no evidence of dilitation. IAS/Shunts: No atrial level shunt detected by color flow Doppler.  LEFT VENTRICLE PLAX 2D LVIDd:         4.18 cm  Diastology LVIDs:         2.90 cm  LV e' lateral:   13.10 cm/s LV PW:         0.92 cm  LV E/e' lateral: 6.5 LV IVS:        1.16 cm  LV e' medial:    9.90 cm/s LVOT diam:     2.20 cm  LV E/e' medial:  8.6 LV SV:         97 LV SV Index:   56 LVOT Area:     3.80 cm  RIGHT VENTRICLE RV Basal diam:  3.06 cm LEFT ATRIUM             Index       RIGHT ATRIUM           Index LA diam:        3.30 cm 1.90 cm/m  RA Area:     19.20 cm LA Vol (A2C):   39.4 ml 22.69 ml/m RA Volume:   58.30 ml  33.58 ml/m LA Vol (A4C):   37.8 ml 21.77 ml/m LA Biplane Vol: 38.6 ml 22.23 ml/m  AORTIC VALVE                    PULMONIC VALVE AV Area (Vmax):    3.41 cm     PV Vmax:       1.20 m/s AV Area (Vmean):   3.03 cm     PV Vmean:      70.400 cm/s AV Area (VTI):     3.11 cm     PV VTI:        0.192 m AV Vmax:           167.00 cm/s  PV Peak grad:  5.8 mmHg AV Vmean:          114.000 cm/s PV Mean grad:  2.0 mmHg AV VTI:            0.312 m AV Peak Grad:      11.2 mmHg AV Mean Grad:      6.0 mmHg LVOT Vmax:         150.00 cm/s LVOT Vmean:        91.000 cm/s LVOT VTI:          0.255 m LVOT/AV VTI ratio: 0.82  AORTA Ao Root diam: 3.50 cm MITRAL VALVE MV Area (PHT): 5.02 cm     SHUNTS MV Peak grad:  11.0 mmHg    Systemic VTI:  0.26 m MV Mean grad:  5.0 mmHg     Systemic Diam: 2.20 cm MV Vmax:       1.66 m/s MV Vmean:      109.0 cm/s MV Decel Time: 151 msec MV E velocity: 85.10 cm/s MV A velocity: 142.00 cm/s MV E/A ratio:  0.60 Arnoldo Hooker MD Electronically signed by Arnoldo Hooker MD Signature Date/Time: 06/04/2019/7:05:01 PM    Final     Scheduled Meds: . amLODipine  10 mg Oral Daily  . carvedilol  6.25 mg Oral BID  . cephALEXin  500 mg Oral Q8H  . enoxaparin (LOVENOX) injection  40 mg Subcutaneous Q24H  . erythromycin   Both Eyes Q8H  .  feeding supplement (ENSURE ENLIVE)  237 mL Oral BID BM  . ferrous sulfate  325 mg Oral BID WC  . Fluticasone-Umeclidin-Vilant  1 puff Inhalation Daily  . ipratropium-albuterol  3 mL Nebulization QID  . lisinopril  5 mg Oral Daily  . mouth rinse  15 mL Mouth Rinse BID  . mometasone-formoterol  2 puff Inhalation BID  . montelukast  10 mg Oral Daily  . pantoprazole  40 mg Oral BID  . pravastatin  40 mg Oral q1800  . tamsulosin  0.4 mg Oral QPC breakfast   Continuous Infusions: . sodium chloride    . sodium chloride 100 mL/hr at 06/05/19 0919     LOS: 2 days   Time spent: 40 minutes.  Arnetha Courser, MD [facility]  If 7PM-7AM, please contact night-coverage [email or web address]  06/05/2019, 1:22 PM   This record has been created using Conservation officer, historic buildings. Errors have been sought and corrected,but may not always be located. Such creation errors do not reflect on the standard of care.

## 2019-06-05 NOTE — TOC Progression Note (Addendum)
Transition of Care [facility]) - Progression Note    Patient Details  Name: Douglas Mathis MRN: [id number] Date of Birth: 1935/09/28  [facility]) CM/SW Contact  Liliana Cline, LCSW Phone Number: 06/05/2019, 10:49 AM  Clinical Narrative:   CSW spoke with [facility]. Verlon Au reported they cannot take patient if he is on nebulizers, would need to switch to inhalers. CSW asked Dr. Nelson Chimes if this is possible. Per chart, patient has not had his COVID vaccines, so [facility] would not be able to accept him.  Dr. Nelson Chimes reported nebulizers can be switched to inhalers before discharge. CSW informed Teacher, adult education. Verlon Au also asked about bipap, informed her that per RN patient is not on bipap, he is on 3L o2 at this time.  CSW called [employer] to start insurance authorization. Reference # [id number]. Faxed information to [place]. [employer] asked for a return call when SNF destination is confirmed.  12:15- Call from [facility] reporting they can accept patient tomorrow when he has been medically stable and off Bipap for 24 hours. Informed Verlon Au that per chart review and RN, patient has not been on Bipap since 4/10. He is on 3L O2 nasal cannula. Verlon Au verbalized understanding and reported they would still need to wait until tomorrow to accept patient.   12:45- Spoke with patient's daughter, Corrie Dandy. Corrie Dandy reported patient has had both COVID vaccines, but she would like to proceed with [facility] since they have offered a bed already. CSW offered to reach out to [facility] as well since this was one of their top preferences and Corrie Dandy said [facility] will be good. Corrie Dandy reported she is going to talk with patient and her sisters to confirm and will call CSW back this afternoon. Informed Corrie Dandy that we are waiting on insurance auth and that the facility is ready to accept patient tomorrow. Corrie Dandy reported she may be able provide transport, depending on if patient can  safely get into her car tomorrow. She said she will let CSW know if she needs CSW to arrange nonemergent EMS transport or if she will transport patient to [facility]. Mary asked about HCPOA paperwork, CSW asked RN to submit Chaplain consult for this. Mary agreed to call CSW with any questions or if they change their minds about [facility].   3:40- CSW called patient's daughter and asked her to bring in copy of patient's COVID vaccine card with dates as requested by [employer]. She reported it may be in patient's wallet, she will look when she visits this afternoon and give a copy to patient's RN if he does have it. Informed RN Lars Mage.      Expected Discharge Plan: Skilled Nursing Facility Barriers to Discharge: Continued Medical Work up  Expected Discharge Plan and Services Expected Discharge Plan: Skilled Nursing Facility In-house Referral: Clinical Social Work   Post Acute Care Choice: Skilled Nursing Facility Living arrangements for the past 2 months: Apartment                                       Social Determinants of Health (SDOH) Interventions    Readmission Risk Interventions No flowsheet data found.

## 2019-06-05 NOTE — Discharge Summary (Addendum)
Physician Discharge Summary  Douglas Mathis ZOX:[id number] DOB: 03/28/1935 DOA: 06/02/2019  PCP: [facility]  Admit date: 06/02/2019 Discharge date: 06/06/2019  Admitted From: Home Disposition:  SNF  Recommendations for Outpatient Follow-up:  1. Follow up with PCP in 1-2 weeks 2. Please obtain BMP/CBC in one week 3. Please follow up on the following pending results:None  Home Health:No Equipment/Devices: Rolling walker, home oxygen. Discharge Condition: Fair CODE STATUS: Number Diet recommendation: Heart Healthy    Brief/Interim Summary: FrankChandleris a84 y.o.African-American malewith a known history of hypertension, dyslipidemia, COPD, chronic respiratory failure on home O2 at 3 L/min, CHF and BPH, who presented to the emergency room with acute onset of altered mental status and generalized weakness with low blood pressure and recent syncope twice. He denied any chest pain or palpitations. Patient was having dyspnea with associated mild cough and wheezing in the ER. No fever or chills. No nausea vomiting or abdominal pain or diarrhea. No melena or bright red bleeding per rectum or other bleeding diathesis. He denied dysuria, oliguria or hematuria or flank pain.  Upon presentation to the emergency room, vital signs were within normal and later blood pressure was 89/38 with a temperature of 96.9 respiratory to 21. Blood pressures improved to 115/42 and later was 99/43 with a pulse of 62. Labs revealed a venous blood gas with pH 7.26, PCO2 of 87 and PO2 35 with bicarbonate of 39. Influenza antigens and COVID-19 PCR came back negative. CMP was remarkable for hyponatremia and hypochloremia with a potassium of 5.1 blood glucose of 204 and BUN of 28 with creatinine of 1.07. CK was 95 and CBC showed leukocytosis of 12.4 with anemia and urinalysis showed 6-10 WBCs with 11-20 RBCs and hyaline casts and rare bacteria. Urine culture was sent. Noncontrasted  head CT scan revealed no acute intracranial normalities. It showed atrophy and mild chronic small vessel ischemic changes of the white matter. Chest x-ray showed chronic appearing increased interstitial lung markings with mild stable left basal atelectasis. EKG showed sinus rhythm with rate of 70 with first-degree AV block, left axis deviation and right bundle branch block. Initially managed with BiPAP in stepdown.  Able to weaned off from BiPAP pretty quickly and now saturating well on his home oxygen of 3 L.  Blood culture remain negative.  Urine culture grew Klebsiella pneumonia.  He was treated with ceftriaxone initially which was deescalated with Keflex to complete a 7-day course.  Patient was unable to take care of himself at home.  PT evaluation was done which were recommending SNF placement and patient will be discharged to rehab for further management.  For concern of syncopal episodes, orthostatic vitals were negative.  Echocardiogram was normal and US carotid bilateral with bilateral carotid bifurcation plaques but without any demonstration of high-grade stenosis. His blood pressure was within goal initially and we held his home antihypertensives.  Become elevated next day and home antihypertensives were restarted.  He needs to follow-up with PCP for further management.  Patient has poor p.o. intake.  Did had hyponatremia and hypochloremia on admission which was thought to be due to dehydration as it responded well with IV fluid.  There was no concern for COPD exacerbation.  No wheezing.  He will continue his home oxygen of 3 L along with his home inhalers.  He will continue rest of his home meds.  Discharge Diagnoses:  Active Problems:   Acute respiratory failure with hypoxia and hypercarbia (HCC)   Shock (HCC)   Acute  on chronic respiratory failure with hypercapnia (HCC)   Hyponatremia   Fall   Generalized weakness   Syncope   Hypotension    Discharge  Instructions  Discharge Instructions    Diet - low sodium heart healthy   Complete by: As directed    Increase activity slowly   Complete by: As directed    Increase activity slowly   Complete by: As directed      Allergies as of 06/06/2019   No Known Allergies     Medication List    STOP taking these medications   DOK 100 MG capsule Generic drug: docusate sodium   furosemide 40 MG tablet Commonly known as: Lasix   ipratropium-albuterol 0.5-2.5 (3) MG/3ML Soln Commonly known as: DUONEB   lovastatin 40 MG tablet Commonly known as: MEVACOR Replaced by: pravastatin 40 MG tablet     TAKE these medications   amLODipine 10 MG tablet Commonly known as: NORVASC Take 1 tablet (10 mg total) by mouth daily for 30 days.   carvedilol 6.25 MG tablet Commonly known as: Coreg Take 1 tablet (6.25 mg total) by mouth 2 (two) times daily.   cephALEXin 500 MG capsule Commonly known as: KEFLEX Take 1 capsule (500 mg total) by mouth every 8 (eight) hours for 3 days.   erythromycin ophthalmic ointment Place into both eyes every 8 (eight) hours.   feeding supplement (ENSURE ENLIVE) Liqd Take 237 mLs by mouth 2 (two) times daily between meals. What changed: Another medication with the same name was added. Make sure you understand how and when to take each.   feeding supplement (ENSURE ENLIVE) Liqd Take 237 mLs by mouth 3 (three) times daily between meals. What changed: You were already taking a medication with the same name, and this prescription was added. Make sure you understand how and when to take each.   ferrous sulfate 325 (65 FE) MG tablet Take 1 tablet (325 mg total) by mouth 2 (two) times daily with a meal.   Fluticasone-Umeclidin-Vilant 100-62.5-25 MCG/INH Aepb Inhale 1 puff into the lungs daily.   GNP Vitamin C 500 MG tablet Generic drug: ascorbic acid Take 500 mg by mouth 2 (two) times daily.   lisinopril 5 MG tablet Commonly known as: ZESTRIL Take 1 tablet (5 mg  total) by mouth daily for 30 days.   montelukast 10 MG tablet Commonly known as: SINGULAIR Take 10 mg by mouth daily.   multivitamin with minerals Tabs tablet Take 1 tablet by mouth daily. Start taking on: June 07, 2019   pantoprazole 40 MG tablet Commonly known as: PROTONIX Take 40 mg by mouth daily.   pravastatin 40 MG tablet Commonly known as: PRAVACHOL Take 1 tablet (40 mg total) by mouth daily at 6 PM. Replaces: lovastatin 40 MG tablet   ProAir HFA 108 (90 Base) MCG/ACT inhaler Generic drug: albuterol Inhale 2 puffs into the lungs 4 (four) times daily as needed for wheezing or shortness of breath.   tamsulosin 0.4 MG Caps capsule Commonly known as: FLOMAX Take 0.4 mg by mouth daily after breakfast.      Contact information for after-discharge care    Destination    [facility] .   Service: Skilled Nursing Contact information: [address] [place] [phone]             No Known Allergies  Consultations:  None  Procedures/Studies: CT Head Wo Contrast  Result Date: 06/03/2019 CLINICAL DATA:  Encephalopathy fall EXAM: CT HEAD  WITHOUT CONTRAST TECHNIQUE: Contiguous axial images were obtained from the base of the skull through the vertex without intravenous contrast. COMPARISON:  CT brain 06/10/2016 FINDINGS: Brain: No acute territorial infarction, hemorrhage, or intracranial mass. Moderate atrophy. Minimal hypodensity in the periventricular white matter likely chronic small vessel ischemic change. Stable ventricle size Vascular: No hyperdense vessels.  Carotid vascular calcification Skull: Normal. Negative for fracture or focal lesion. Sinuses/Orbits: Mucosal thickening in the ethmoid sinuses Other: None IMPRESSION: 1. No CT evidence for acute intracranial abnormality. 2. Atrophy and mild chronic small vessel ischemic change of the white matter Electronically Signed   By: Jasmine Pang M.D.   On:  06/03/2019 00:35   US Carotid Bilateral  Result Date: 06/03/2019 CLINICAL DATA:  Syncope, altered mental status. Hypertension, hyperlipidemia, previous tobacco abuse EXAM: BILATERAL CAROTID DUPLEX ULTRASOUND TECHNIQUE: [facility] scale imaging, color Doppler and duplex ultrasound were performed of bilateral carotid and vertebral arteries in the neck. Technologist describes technically difficult study secondary to patient motion and breathing. COMPARISON:  None. FINDINGS: Criteria: Quantification of carotid stenosis is based on velocity parameters that correlate the residual internal carotid diameter with NASCET-based stenosis levels, using the diameter of the distal internal carotid lumen as the denominator for stenosis measurement. The following velocity measurements were obtained: RIGHT ICA: 109/29 cm/sec CCA: 90/20 cm/sec SYSTOLIC ICA/CCA RATIO:  1.2 ECA: 58 cm/sec LEFT ICA: 136/49 cm/sec CCA: 49/13 cm/sec SYSTOLIC ICA/CCA RATIO:  2.8 ECA: 108 cm/sec RIGHT CAROTID ARTERY: Mild tortuosity. Eccentric calcified plaque in the distal common carotid artery and bulb, resulting in at least mild stenosis. Calcified plaque in the proximal ICA. Limited quality waveforms and color Doppler evaluation of the proximal and mid ICA. Normal flow signal in the distal ICA. RIGHT VERTEBRAL ARTERY:  Normal flow direction and waveform. LEFT CAROTID ARTERY: Calcified plaque in the common carotid artery and bulb without high-grade stenosis. Calcified plaque at the origin of the ICA resulting in some distal acoustic shadowing. Normal waveforms and color Doppler signal. LEFT VERTEBRAL ARTERY:  Normal flow direction and waveform. IMPRESSION: 1. Bilateral carotid bifurcation plaque without demonstration high-grade stenosis. Limited evaluation of the proximal right ICA. Consider follow-up study when the patient is more able to cooperate, or CTA for further characterization. 2.  Antegrade bilateral vertebral arterial flow. Electronically Signed    By: Corlis Leak M.D.   On: 06/03/2019 12:48   DG Chest Portable 1 View  Result Date: 06/02/2019 CLINICAL DATA:  Status post fall. EXAM: PORTABLE CHEST 1 VIEW COMPARISON:  April 01, 2019 FINDINGS: Mild to moderate severity diffuse chronic appearing increased interstitial lung markings are seen. Mild, stable atelectasis is seen along the lateral aspect of the left lung base. There is no evidence of a pleural effusion or pneumothorax. The heart size and mediastinal contours are within normal limits. There is marked severity calcification of the aortic arch. Multilevel degenerative changes are noted throughout the thoracic spine. IMPRESSION: Chronic-appearing increased interstitial lung markings with mild, stable left basilar atelectasis. Electronically Signed   By: Aram Candela M.D.   On: 06/02/2019 23:46   ECHOCARDIOGRAM COMPLETE  Result Date: 06/04/2019    ECHOCARDIOGRAM REPORT   Patient Name:   Douglas Mathis Date of Exam: 06/04/2019 Medical Rec #:  [id number]        Height:       63.0 in Accession #:    [id number]       Weight:       155.2 lb Date of Birth:  1935-09-04  BSA:          1.736 m Patient Age:    84 years         BP:           110/44 mmHg Patient Gender: M                HR:           85 bpm. Exam Location:  [facility] Procedure: 2D Echo, Color Doppler and Cardiac Doppler Indications:     R55 Syncope  History:         Patient has prior history of Echocardiogram examinations. CHF,                  COPD; Risk Factors:Hypertension and HCL.  Sonographer:     Humphrey Rolls RDCS (AE) Referring Phys:  [id number] Vernetta Honey MANSY Diagnosing Phys: Arnoldo Hooker MD IMPRESSIONS  1. Left ventricular ejection fraction, by estimation, is 60 to 65%. The left ventricle has normal function. The left ventricle has no regional wall motion abnormalities. Left ventricular diastolic parameters were normal.  2. Right ventricular systolic function is normal. The right ventricular size is normal.  3. The mitral valve is  normal in structure. Trivial mitral valve regurgitation.  4. The aortic valve is normal in structure. Aortic valve regurgitation is not visualized. FINDINGS  Left Ventricle: Left ventricular ejection fraction, by estimation, is 60 to 65%. The left ventricle has normal function. The left ventricle has no regional wall motion abnormalities. The left ventricular internal cavity size was normal in size. There is  no left ventricular hypertrophy. Left ventricular diastolic parameters were normal. Right Ventricle: The right ventricular size is normal. No increase in right ventricular wall thickness. Right ventricular systolic function is normal. Left Atrium: Left atrial size was normal in size. Right Atrium: Right atrial size was normal in size. Pericardium: There is no evidence of pericardial effusion. Mitral Valve: The mitral valve is normal in structure. Trivial mitral valve regurgitation. MV peak gradient, 11.0 mmHg. The mean mitral valve gradient is 5.0 mmHg. Tricuspid Valve: The tricuspid valve is normal in structure. Tricuspid valve regurgitation is trivial. Aortic Valve: The aortic valve is normal in structure. Aortic valve regurgitation is not visualized. Aortic valve mean gradient measures 6.0 mmHg. Aortic valve peak gradient measures 11.2 mmHg. Aortic valve area, by VTI measures 3.11 cm. Pulmonic Valve: The pulmonic valve was normal in structure. Pulmonic valve regurgitation is not visualized. Aorta: The aortic root and ascending aorta are structurally normal, with no evidence of dilitation. IAS/Shunts: No atrial level shunt detected by color flow Doppler.  LEFT VENTRICLE PLAX 2D LVIDd:         4.18 cm  Diastology LVIDs:         2.90 cm  LV e' lateral:   13.10 cm/s LV PW:         0.92 cm  LV E/e' lateral: 6.5 LV IVS:        1.16 cm  LV e' medial:    9.90 cm/s LVOT diam:     2.20 cm  LV E/e' medial:  8.6 LV SV:         97 LV SV Index:   56 LVOT Area:     3.80 cm  RIGHT VENTRICLE RV Basal diam:  3.06 cm LEFT  ATRIUM             Index       RIGHT ATRIUM  Index LA diam:        3.30 cm 1.90 cm/m  RA Area:     19.20 cm LA Vol (A2C):   39.4 ml 22.69 ml/m RA Volume:   58.30 ml  33.58 ml/m LA Vol (A4C):   37.8 ml 21.77 ml/m LA Biplane Vol: 38.6 ml 22.23 ml/m  AORTIC VALVE                    PULMONIC VALVE AV Area (Vmax):    3.41 cm     PV Vmax:       1.20 m/s AV Area (Vmean):   3.03 cm     PV Vmean:      70.400 cm/s AV Area (VTI):     3.11 cm     PV VTI:        0.192 m AV Vmax:           167.00 cm/s  PV Peak grad:  5.8 mmHg AV Vmean:          114.000 cm/s PV Mean grad:  2.0 mmHg AV VTI:            0.312 m AV Peak Grad:      11.2 mmHg AV Mean Grad:      6.0 mmHg LVOT Vmax:         150.00 cm/s LVOT Vmean:        91.000 cm/s LVOT VTI:          0.255 m LVOT/AV VTI ratio: 0.82  AORTA Ao Root diam: 3.50 cm MITRAL VALVE MV Area (PHT): 5.02 cm     SHUNTS MV Peak grad:  11.0 mmHg    Systemic VTI:  0.26 m MV Mean grad:  5.0 mmHg     Systemic Diam: 2.20 cm MV Vmax:       1.66 m/s MV Vmean:      109.0 cm/s MV Decel Time: 151 msec MV E velocity: 85.10 cm/s MV A velocity: 142.00 cm/s MV E/A ratio:  0.60 Arnoldo Hooker MD Electronically signed by Arnoldo Hooker MD Signature Date/Time: 06/04/2019/7:05:01 PM    Final     Subjective: Patient has no new complaints today.  Discharge Exam: Vitals:   06/06/19 1203 06/06/19 1428  BP: (!) 114/48   Pulse: 76 81  Resp: 20 18  Temp: 98.8 F (37.1 C)   SpO2: 100% 93%   Vitals:   06/06/19 0829 06/06/19 1125 06/06/19 1203 06/06/19 1428  BP:  (!) 126/52 (!) 114/48   Pulse:  73 76 81  Resp:   20 18  Temp:   98.8 F (37.1 C)   TempSrc:   Oral   SpO2: 98% 91% 100% 93%  Weight:      Height:        General: Pt is alert, awake, not in acute distress Cardiovascular: RRR, S1/S2 +, no rubs, no gallops Respiratory: CTA bilaterally, no wheezing, no rhonchi Abdominal: Soft, NT, ND, bowel sounds + Extremities: no edema, no cyanosis   The results of significant  diagnostics from this hospitalization (including imaging, microbiology, ancillary and laboratory) are listed below for reference.    Microbiology: Recent Results (from the past 240 hour(s))  Urine Culture     Status: Abnormal   Collection Time: 06/02/19 10:37 PM   Specimen: Urine, Random  Result Value Ref Range Status   Specimen Description   Final    URINE, RANDOM Performed at [facility], [address] [place], [place] [address]  Special Requests   Final    NONE Performed at [facility], [address] [place], [place] [address]    Culture >=100,000 COLONIES/mL KLEBSIELLA PNEUMONIAE (A)  Final   Report Status 06/05/2019 FINAL  Final   Organism ID, Bacteria KLEBSIELLA PNEUMONIAE (A)  Final      Susceptibility   Klebsiella pneumoniae - MIC*    AMPICILLIN RESISTANT Resistant     CEFAZOLIN <=4 SENSITIVE Sensitive     CEFTRIAXONE <=0.25 SENSITIVE Sensitive     CIPROFLOXACIN <=0.25 SENSITIVE Sensitive     GENTAMICIN <=1 SENSITIVE Sensitive     IMIPENEM <=0.25 SENSITIVE Sensitive     NITROFURANTOIN 32 SENSITIVE Sensitive     TRIMETH/SULFA <=20 SENSITIVE Sensitive     AMPICILLIN/SULBACTAM <=2 SENSITIVE Sensitive     PIP/TAZO <=4 SENSITIVE Sensitive     * >=100,000 COLONIES/mL KLEBSIELLA PNEUMONIAE  Respiratory Panel by RT PCR (Flu A&B, Covid) - Nasopharyngeal Swab     Status: None   Collection Time: 06/02/19 11:37 PM   Specimen: Nasopharyngeal Swab  Result Value Ref Range Status   SARS Coronavirus 2 by RT PCR NEGATIVE NEGATIVE Final    Comment: (NOTE) SARS-CoV-2 target nucleic acids are NOT DETECTED. The SARS-CoV-2 RNA is generally detectable in upper respiratoy specimens during the acute phase of infection. The lowest concentration of SARS-CoV-2 viral copies this assay can detect is 131 copies/mL. A negative result does not preclude SARS-Cov-2 infection and should not be used as the sole basis for treatment or other patient management decisions. A  negative result may occur with  improper specimen collection/handling, submission of specimen other than nasopharyngeal swab, presence of viral mutation(s) within the areas targeted by this assay, and inadequate number of viral copies (<131 copies/mL). A negative result must be combined with clinical observations, patient history, and epidemiological information. The expected result is Negative. Fact Sheet for Patients:  https://www.moore.com/ Fact Sheet for Healthcare Providers:  https://www.young.biz/ This test is not yet ap proved or cleared by the [place] FDA and  has been authorized for detection and/or diagnosis of SARS-CoV-2 by FDA under an Emergency Use Authorization (EUA). This EUA will remain  in effect (meaning this test can be used) for the duration of the COVID-19 declaration under Section 564(b)(1) of the Act, 21 U.S.C. section 360bbb-3(b)(1), unless the authorization is terminated or revoked sooner.    Influenza A by PCR NEGATIVE NEGATIVE Final   Influenza B by PCR NEGATIVE NEGATIVE Final    Comment: (NOTE) The Xpert Xpress SARS-CoV-2/FLU/RSV assay is intended as an aid in  the diagnosis of influenza from Nasopharyngeal swab specimens and  should not be used as a sole basis for treatment. Nasal washings and  aspirates are unacceptable for Xpert Xpress SARS-CoV-2/FLU/RSV  testing. Fact Sheet for Patients: https://www.moore.com/ Fact Sheet for Healthcare Providers: https://www.young.biz/ This test is not yet approved or cleared by the [place] FDA and  has been authorized for detection and/or diagnosis of SARS-CoV-2 by  FDA under an Emergency Use Authorization (EUA). This EUA will remain  in effect (meaning this test can be used) for the duration of the  Covid-19 declaration under Section 564(b)(1) of the Act, 21  U.S.C. section 360bbb-3(b)(1), unless the authorization is   terminated or revoked. Performed at [facility], [address] [place], [place] [address]   CULTURE, BLOOD (ROUTINE X 2) w Reflex to ID Panel     Status: None (Preliminary result)   Collection Time: 06/02/19 11:37 PM   Specimen: BLOOD  Result Value Ref Range Status   Specimen Description BLOOD RT [facility]  Final   Special Requests   Final    BOTTLES DRAWN AEROBIC AND ANAEROBIC Blood Culture results may not be optimal due to an excessive volume of blood received in culture bottles   Culture   Final    NO GROWTH 3 DAYS Performed at [facility], [address], [place], [place] [address]    Report Status PENDING  Incomplete  CULTURE, BLOOD (ROUTINE X 2) w Reflex to ID Panel     Status: None (Preliminary result)   Collection Time: 06/02/19 11:37 PM   Specimen: BLOOD  Result Value Ref Range Status   Specimen Description BLOOD LEFT WRIST  Final   Special Requests   Final    BOTTLES DRAWN AEROBIC AND ANAEROBIC Blood Culture results may not be optimal due to an inadequate volume of blood received in culture bottles   Culture   Final    NO GROWTH 3 DAYS Performed at [facility], [address], [place], [place] [address]    Report Status PENDING  Incomplete  MRSA PCR Screening     Status: None   Collection Time: 06/03/19  5:30 AM   Specimen: Nasopharyngeal  Result Value Ref Range Status   MRSA by PCR NEGATIVE NEGATIVE Final    Comment:        The GeneXpert MRSA Assay (FDA approved for NASAL specimens only), is one component of a comprehensive MRSA colonization surveillance program. It is not intended to diagnose MRSA infection nor to guide or monitor treatment for MRSA infections. Performed at [facility], [address] [place], [place] [address]   SARS CORONAVIRUS 2 (TAT 6-24 HRS) Nasopharyngeal Nasopharyngeal Swab     Status: None   Collection Time: 06/05/19 11:28 AM   Specimen: Nasopharyngeal Swab  Result Value Ref Range Status    SARS Coronavirus 2 NEGATIVE NEGATIVE Final    Comment: (NOTE) SARS-CoV-2 target nucleic acids are NOT DETECTED. The SARS-CoV-2 RNA is generally detectable in upper and lower respiratory specimens during the acute phase of infection. Negative results do not preclude SARS-CoV-2 infection, do not rule out co-infections with other pathogens, and should not be used as the sole basis for treatment or other patient management decisions. Negative results must be combined with clinical observations, patient history, and epidemiological information. The expected result is Negative. Fact Sheet for Patients: HairSlick.no Fact Sheet for Healthcare Providers: quierodirigir.com This test is not yet approved or cleared by the [place] FDA and  has been authorized for detection and/or diagnosis of SARS-CoV-2 by FDA under an Emergency Use Authorization (EUA). This EUA will remain  in effect (meaning this test can be used) for the duration of the COVID-19 declaration under Section 56 4(b)(1) of the Act, 21 U.S.C. section 360bbb-3(b)(1), unless the authorization is terminated or revoked sooner. Performed at [facility], [address] [place], [place] [address]      Labs: BNP (last 3 results) No results for input(s): BNP in the last 8760 hours. Basic Metabolic Panel: Recent Labs  Lab 06/02/19 2237 06/03/19 0820 06/05/19 0606 06/06/19 0503  NA 128* 131* 129* 132*  K 5.1 4.4 4.5 4.4  CL 85* 91* 87* 89*  CO2 33* 33* 34* 33*  GLUCOSE 204* 104* 110* 114*  BUN 28* 24* 18 13  CREATININE 1.07 0.87 0.77 0.66  CALCIUM 8.7* 8.3* 8.5* 8.5*   Liver Function Tests: Recent Labs  Lab 06/02/19 2237  AST 33  ALT 23  ALKPHOS 55  BILITOT 0.6  PROT 6.7  ALBUMIN 3.8   No results for input(s): LIPASE, AMYLASE in the last 168 hours. No results for input(s): AMMONIA in the last 168 hours. CBC: Recent Labs  Lab 06/02/19 2237  06/02/19 2237 06/03/19 0820 06/04/19 0706 06/04/19 2357 06/05/19 0606 06/06/19 0503  WBC 12.4*   < > 10.7* 11.7* 13.9* 13.0* 13.7*  NEUTROABS 10.3*  --   --   --   --   --   --   HGB 8.2*   < > 7.3* 7.2* 8.2* 8.2* 8.6*  HCT 25.9*   < > 23.8* 24.2* 25.6* 26.1* 26.6*  MCV 89.0   < > 89.1 93.1 89.2 90.3 89.3  PLT 233   < > 216 243 204 209 218   < > = values in this interval not displayed.   Cardiac Enzymes: Recent Labs  Lab 06/02/19 2237  CKTOTAL 95   BNP: Invalid input(s): POCBNP CBG: Recent Labs  Lab 06/02/19 2254 06/03/19 0611  GLUCAP 181* 142*   D-Dimer No results for input(s): DDIMER in the last 72 hours. Hgb A1c No results for input(s): HGBA1C in the last 72 hours. Lipid Profile No results for input(s): CHOL, HDL, LDLCALC, TRIG, CHOLHDL, LDLDIRECT in the last 72 hours. Thyroid function studies No results for input(s): TSH, T4TOTAL, T3FREE, THYROIDAB in the last 72 hours.  Invalid input(s): FREET3 Anemia work up Recent Labs    06/04/19 0706 06/04/19 1350  VITAMINB12  --  684  FOLATE 17.3  --   FERRITIN 20*  --   TIBC 340  --   IRON 24*  --   RETICCTPCT 5.9*  --    Urinalysis    Component Value Date/Time   COLORURINE YELLOW (A) 06/02/2019 2237   APPEARANCEUR HAZY (A) 06/02/2019 2237   APPEARANCEUR Cloudy 10/05/2013 0414   LABSPEC 1.009 06/02/2019 2237   LABSPEC 1.017 10/05/2013 0414   PHURINE 5.0 06/02/2019 2237   GLUCOSEU NEGATIVE 06/02/2019 2237   GLUCOSEU Negative 10/05/2013 0414   HGBUR SMALL (A) 06/02/2019 2237   [place] NEGATIVE 06/02/2019 2237   BILIRUBINUR Negative 10/05/2013 0414   KETONESUR NEGATIVE 06/02/2019 2237   PROTEINUR NEGATIVE 06/02/2019 2237   NITRITE NEGATIVE 06/02/2019 2237   LEUKOCYTESUR MODERATE (A) 06/02/2019 2237   LEUKOCYTESUR 3+ 10/05/2013 0414   Sepsis Labs Invalid input(s): PROCALCITONIN,  WBC,  LACTICIDVEN Microbiology Recent Results (from the past 240 hour(s))  Urine Culture     Status: Abnormal    Collection Time: 06/02/19 10:37 PM   Specimen: Urine, Random  Result Value Ref Range Status   Specimen Description   Final    URINE, RANDOM Performed at [facility], [address] [place], [place] [address]    Special Requests   Final    NONE Performed at [facility], [address] [place], [place] [address]    Culture >=100,000 COLONIES/mL KLEBSIELLA PNEUMONIAE (A)  Final   Report Status 06/05/2019 FINAL  Final   Organism ID, Bacteria KLEBSIELLA PNEUMONIAE (A)  Final      Susceptibility   Klebsiella pneumoniae - MIC*    AMPICILLIN RESISTANT Resistant     CEFAZOLIN <=4 SENSITIVE Sensitive     CEFTRIAXONE <=0.25 SENSITIVE Sensitive     CIPROFLOXACIN <=0.25 SENSITIVE Sensitive     GENTAMICIN <=1 SENSITIVE Sensitive     IMIPENEM <=0.25 SENSITIVE Sensitive     NITROFURANTOIN 32 SENSITIVE Sensitive     TRIMETH/SULFA <=20 SENSITIVE  Sensitive     AMPICILLIN/SULBACTAM <=2 SENSITIVE Sensitive     PIP/TAZO <=4 SENSITIVE Sensitive     * >=100,000 COLONIES/mL KLEBSIELLA PNEUMONIAE  Respiratory Panel by RT PCR (Flu A&B, Covid) - Nasopharyngeal Swab     Status: None   Collection Time: 06/02/19 11:37 PM   Specimen: Nasopharyngeal Swab  Result Value Ref Range Status   SARS Coronavirus 2 by RT PCR NEGATIVE NEGATIVE Final    Comment: (NOTE) SARS-CoV-2 target nucleic acids are NOT DETECTED. The SARS-CoV-2 RNA is generally detectable in upper respiratoy specimens during the acute phase of infection. The lowest concentration of SARS-CoV-2 viral copies this assay can detect is 131 copies/mL. A negative result does not preclude SARS-Cov-2 infection and should not be used as the sole basis for treatment or other patient management decisions. A negative result may occur with  improper specimen collection/handling, submission of specimen other than nasopharyngeal swab, presence of viral mutation(s) within the areas targeted by this assay, and inadequate number of viral  copies (<131 copies/mL). A negative result must be combined with clinical observations, patient history, and epidemiological information. The expected result is Negative. Fact Sheet for Patients:  https://www.moore.com/ Fact Sheet for Healthcare Providers:  https://www.young.biz/ This test is not yet ap proved or cleared by the [place] FDA and  has been authorized for detection and/or diagnosis of SARS-CoV-2 by FDA under an Emergency Use Authorization (EUA). This EUA will remain  in effect (meaning this test can be used) for the duration of the COVID-19 declaration under Section 564(b)(1) of the Act, 21 U.S.C. section 360bbb-3(b)(1), unless the authorization is terminated or revoked sooner.    Influenza A by PCR NEGATIVE NEGATIVE Final   Influenza B by PCR NEGATIVE NEGATIVE Final    Comment: (NOTE) The Xpert Xpress SARS-CoV-2/FLU/RSV assay is intended as an aid in  the diagnosis of influenza from Nasopharyngeal swab specimens and  should not be used as a sole basis for treatment. Nasal washings and  aspirates are unacceptable for Xpert Xpress SARS-CoV-2/FLU/RSV  testing. Fact Sheet for Patients: https://www.moore.com/ Fact Sheet for Healthcare Providers: https://www.young.biz/ This test is not yet approved or cleared by the [place] FDA and  has been authorized for detection and/or diagnosis of SARS-CoV-2 by  FDA under an Emergency Use Authorization (EUA). This EUA will remain  in effect (meaning this test can be used) for the duration of the  Covid-19 declaration under Section 564(b)(1) of the Act, 21  U.S.C. section 360bbb-3(b)(1), unless the authorization is  terminated or revoked. Performed at [facility], [address] [place], [place] [address]   CULTURE, BLOOD (ROUTINE X 2) w Reflex to ID Panel     Status: None (Preliminary result)   Collection Time: 06/02/19 11:37 PM    Specimen: BLOOD  Result Value Ref Range Status   Specimen Description BLOOD RT [facility]  Final   Special Requests   Final    BOTTLES DRAWN AEROBIC AND ANAEROBIC Blood Culture results may not be optimal due to an excessive volume of blood received in culture bottles   Culture   Final    NO GROWTH 3 DAYS Performed at [facility], [address] [place], [place] [address]    Report Status PENDING  Incomplete  CULTURE, BLOOD (ROUTINE X 2) w Reflex to ID Panel     Status: None (Preliminary result)   Collection Time: 06/02/19 11:37 PM   Specimen: BLOOD  Result Value Ref Range Status   Specimen Description BLOOD LEFT WRIST  Final   Special Requests   Final    BOTTLES DRAWN AEROBIC AND ANAEROBIC Blood Culture results may not be optimal due to an inadequate volume of blood received in culture bottles   Culture   Final    NO GROWTH 3 DAYS Performed at [facility], [address] [place], [place] [address]    Report Status PENDING  Incomplete  MRSA PCR Screening     Status: None   Collection Time: 06/03/19  5:30 AM   Specimen: Nasopharyngeal  Result Value Ref Range Status   MRSA by PCR NEGATIVE NEGATIVE Final    Comment:        The GeneXpert MRSA Assay (FDA approved for NASAL specimens only), is one component of a comprehensive MRSA colonization surveillance program. It is not intended to diagnose MRSA infection nor to guide or monitor treatment for MRSA infections. Performed at [facility], [address] [place], [place] [address]   SARS CORONAVIRUS 2 (TAT 6-24 HRS) Nasopharyngeal Nasopharyngeal Swab     Status: None   Collection Time: 06/05/19 11:28 AM   Specimen: Nasopharyngeal Swab  Result Value Ref Range Status   SARS Coronavirus 2 NEGATIVE NEGATIVE Final    Comment: (NOTE) SARS-CoV-2 target nucleic acids are NOT DETECTED. The SARS-CoV-2 RNA is generally detectable in upper and lower respiratory specimens during the acute phase of infection.  Negative results do not preclude SARS-CoV-2 infection, do not rule out co-infections with other pathogens, and should not be used as the sole basis for treatment or other patient management decisions. Negative results must be combined with clinical observations, patient history, and epidemiological information. The expected result is Negative. Fact Sheet for Patients: HairSlick.no Fact Sheet for Healthcare Providers: quierodirigir.com This test is not yet approved or cleared by the [place] FDA and  has been authorized for detection and/or diagnosis of SARS-CoV-2 by FDA under an Emergency Use Authorization (EUA). This EUA will remain  in effect (meaning this test can be used) for the duration of the COVID-19 declaration under Section 56 4(b)(1) of the Act, 21 U.S.C. section 360bbb-3(b)(1), unless the authorization is terminated or revoked sooner. Performed at [facility], [address] [place], [place] [address]     Time coordinating discharge: Over 30 minutes  SIGNED:  Arnetha Courser, MD  [facility] 06/06/2019, 2:37 PM  If 7PM-7AM, please contact night-coverage [email or web address]  This record has been created using Conservation officer, historic buildings. Errors have been sought and corrected,but may not always be located. Such creation errors do not reflect on the standard of care.

## 2019-06-05 NOTE — Progress Notes (Signed)
Initial Nutrition Assessment  DOCUMENTATION CODES:   Not applicable  INTERVENTION:   Ensure Enlive po TID, each supplement provides 350 kcal and 20 grams of protein  MVI daily   Liberalize diet   NUTRITION DIAGNOSIS:   Increased nutrient needs related to chronic illness(COPD, CHF) as evidenced by increased estimated needs  GOAL:   Patient will meet greater than or equal to 90% of their needs  MONITOR:   PO intake, Supplement acceptance, Labs, Weight trends, Skin, I & O's  REASON FOR ASSESSMENT:   Malnutrition Screening Tool    ASSESSMENT:   84 y.o. African-American male with a known history of hypertension, dyslipidemia, COPD, chronic respiratory failure on home O2 at 3 L/min, CHF and BPH, who presented to the emergency room with acute onset of altered mental status and generalized weakness   Met with pt in room today. Pt is a poor historian but reports good appetite and oral intake at baseline. Pt had not touched his lunch tray which was sitting on his side table but pt is documented to be eating 50-100% of meals in hospital. RD will add supplements and vitamins to help pt meet his estimated needs. RD will also liberalize the heart healthy portion of pt's diet as this is restrictive of protein. Per chart, pt appears weight stable at baseline.   Medications reviewed and include: cephalexin, lovenox, ferrous sulfate, protonix, NaCl _0 /hr  Labs reviewed: Na 129(L) Iron 24(L), TIBC 340, ferritin 20(L), folate 17.3 Wbc- 13.0(H), Hgb 8.2(L), Hct 26.1(L)  NUTRITION - FOCUSED PHYSICAL EXAM:    Most Recent Value  Orbital Region  No depletion  Upper Arm Region  No depletion  Thoracic and Lumbar Region  No depletion  Buccal Region  No depletion  Temple Region  No depletion  Clavicle Bone Region  No depletion  Clavicle and Acromion Bone Region  No depletion  Scapular Bone Region  No depletion  Dorsal Hand  No depletion  Patellar Region  No depletion  Anterior Thigh  Region  Mild depletion  Posterior Calf Region  Mild depletion  Edema (RD Assessment)  None  Hair  Reviewed  Eyes  Reviewed  Mouth  Reviewed  Skin  Reviewed  Nails  Reviewed     Diet Order:   Diet Order            Diet 2 gram sodium Room service appropriate? Yes; Fluid consistency: Thin  Diet effective now             EDUCATION NEEDS:   No education needs have been identified at this time  Skin:  Skin Assessment: Reviewed RN Assessment  Last BM:  PTA  Height:   Ht Readings from Last 1 Encounters:  06/03/19 _1  (1.6 m)    Weight:   Wt Readings from Last 1 Encounters:  06/03/19 70.4 kg    Ideal Body Weight:  56.3 kg  BMI:  Body mass index is 27.49 kg/m.  Estimated Nutritional Needs:   Kcal:  1600-1800kcal/day  Protein:  80-90g/day  Fluid:  >1.4L/day  Koleen Distance MS, RD, LDN Please refer to [facility] for RD and/or RD on-call/weekend/after hours pager

## 2019-06-05 NOTE — Progress Notes (Signed)
CH visited pt. per OR for help w/advanced directive; pt. undergoing breathing treatment when CH entered rm.  RN said SW had been in to see pt. and had talked w/pt. re: AD.  Document on pt.'s tray in rm when [facility] arrived.  Pt. said he was interested in completing document tomorrow when his dtr. arrives; [facility] wrote chaplain suite phone # on document and indicated chaplains can come help pt. if needed tomorrow.  No further needs expressed at this time.    06/05/19 1500  Clinical Encounter Type  Visited With Patient  Visit Type Initial (Advanced Directive education/completion)  Referral From Nurse;Social work

## 2019-06-06 DIAGNOSIS — I959 Hypotension, unspecified: Secondary | ICD-10-CM

## 2019-06-06 LAB — CBC
HCT: 26.6 % — ABNORMAL LOW (ref 39.0–52.0)
Hemoglobin: 8.6 g/dL — ABNORMAL LOW (ref 13.0–17.0)
MCH: 28.9 pg (ref 26.0–34.0)
MCHC: 32.3 g/dL (ref 30.0–36.0)
MCV: 89.3 fL (ref 80.0–100.0)
Platelets: 218 10*3/uL (ref 150–400)
RBC: 2.98 MIL/uL — ABNORMAL LOW (ref 4.22–5.81)
RDW: 18 % — ABNORMAL HIGH (ref 11.5–15.5)
WBC: 13.7 10*3/uL — ABNORMAL HIGH (ref 4.0–10.5)
nRBC: 0 % (ref 0.0–0.2)

## 2019-06-06 LAB — GLUCOSE, CAPILLARY: Glucose-Capillary: 142 mg/dL — ABNORMAL HIGH (ref 70–99)

## 2019-06-06 LAB — BASIC METABOLIC PANEL
Anion gap: 10 (ref 5–15)
BUN: 13 mg/dL (ref 8–23)
CO2: 33 mmol/L — ABNORMAL HIGH (ref 22–32)
Calcium: 8.5 mg/dL — ABNORMAL LOW (ref 8.9–10.3)
Chloride: 89 mmol/L — ABNORMAL LOW (ref 98–111)
Creatinine, Ser: 0.66 mg/dL (ref 0.61–1.24)
GFR calc Af Amer: >60 mL/min (ref >60)
GFR calc non Af Amer: >60 mL/min (ref >60)
Glucose, Bld: 114 mg/dL — ABNORMAL HIGH (ref 70–99)
Potassium: 4.4 mmol/L (ref 3.5–5.1)
Sodium: 132 mmol/L — ABNORMAL LOW (ref 135–145)

## 2019-06-06 MED ORDER — ADULT MULTIVITAMIN W/MINERALS CH: 1.0000 | ORAL_TABLET | Freq: Every day | ORAL | Status: DC

## 2019-06-06 MED ORDER — ENSURE ENLIVE PO LIQD: 237.0000 mL | Freq: Three times a day (TID) | ORAL | 12 refills | Status: DC

## 2019-06-06 MED ORDER — CEPHALEXIN 500 MG PO CAPS: 500.0000 mg | ORAL_CAPSULE | Freq: Three times a day (TID) | ORAL | 0 refills | Status: AC

## 2019-06-06 MED ORDER — IPRATROPIUM-ALBUTEROL 0.5-2.5 (3) MG/3ML IN SOLN
3.0000 mL | Freq: Three times a day (TID) | RESPIRATORY_TRACT | Status: DC
  Administered 2019-06-06: 3 mL via RESPIRATORY_TRACT

## 2019-06-06 MED ORDER — ERYTHROMYCIN 5 MG/GM OP OINT: TOPICAL_OINTMENT | Freq: Three times a day (TID) | OPHTHALMIC | 0 refills | Status: DC

## 2019-06-06 MED ORDER — PRAVASTATIN SODIUM 40 MG PO TABS: 40.0000 mg | ORAL_TABLET | Freq: Every day | ORAL | 0 refills | Status: DC

## 2019-06-06 NOTE — Progress Notes (Addendum)
Spoke with daughter over phone to update her. Patient transferring to [facility] via EMS. Per EMS patient is sixth on the list. Asked daughter if she would be coming this afternoon to assist the patient in completing the AD paperwork. Per Daughter she is at work can wouldn't be able to come tonight. Told daughter AD paperwork can still be completed at SNF and that it would be inserted in his discharge packet. Also, mentioned this to the receiving nurse taking report at [facility].   Madie Reno, RN

## 2019-06-06 NOTE — Care Management Important Message (Signed)
Important Message  Patient Details  Name: Douglas Mathis MRN: [id number] Date of Birth: 1935-12-09   Medicare Important Message Given:  Yes     [name] 06/06/2019, 11:39 AM

## 2019-06-06 NOTE — Progress Notes (Signed)
Douglas Mathis to be D/C'd Skilled nursing facility per MD order.  Discussed prescriptions and follow up appointments with the patient. Prescriptions given to patient, medication list explained in detail. Pt verbalized understanding.  Allergies as of 06/06/2019   No Known Allergies     Medication List    STOP taking these medications   DOK 100 MG capsule Generic drug: docusate sodium   furosemide 40 MG tablet Commonly known as: Lasix   ipratropium-albuterol 0.5-2.5 (3) MG/3ML Soln Commonly known as: DUONEB   lovastatin 40 MG tablet Commonly known as: MEVACOR Replaced by: pravastatin 40 MG tablet     TAKE these medications   amLODipine 10 MG tablet Commonly known as: NORVASC Take 1 tablet (10 mg total) by mouth daily for 30 days. Notes to patient: Held 06/06/19 due to low blood pressure.    carvedilol 6.25 MG tablet Commonly known as: Coreg Take 1 tablet (6.25 mg total) by mouth 2 (two) times daily. Notes to patient: Held 06/06/19 morning dose due to low blood pressure.    cephALEXin 500 MG capsule Commonly known as: KEFLEX Take 1 capsule (500 mg total) by mouth every 8 (eight) hours for 3 days.   erythromycin ophthalmic ointment Place into both eyes every 8 (eight) hours.   feeding supplement (ENSURE ENLIVE) Liqd Take 237 mLs by mouth 2 (two) times daily between meals. What changed: Another medication with the same name was added. Make sure you understand how and when to take each.   feeding supplement (ENSURE ENLIVE) Liqd Take 237 mLs by mouth 3 (three) times daily between meals. What changed: You were already taking a medication with the same name, and this prescription was added. Make sure you understand how and when to take each.   ferrous sulfate 325 (65 FE) MG tablet Take 1 tablet (325 mg total) by mouth 2 (two) times daily with a meal.   Fluticasone-Umeclidin-Vilant 100-62.5-25 MCG/INH Aepb Inhale 1 puff into the lungs daily.   GNP Vitamin C 500 MG  tablet Generic drug: ascorbic acid Take 500 mg by mouth 2 (two) times daily.   lisinopril 5 MG tablet Commonly known as: ZESTRIL Take 1 tablet (5 mg total) by mouth daily for 30 days. Notes to patient: Medication held 06/06/19 due to low blood pressure.    montelukast 10 MG tablet Commonly known as: SINGULAIR Take 10 mg by mouth daily.   multivitamin with minerals Tabs tablet Take 1 tablet by mouth daily. Start taking on: June 07, 2019   pantoprazole 40 MG tablet Commonly known as: PROTONIX Take 40 mg by mouth daily.   pravastatin 40 MG tablet Commonly known as: PRAVACHOL Take 1 tablet (40 mg total) by mouth daily at 6 PM. Replaces: lovastatin 40 MG tablet   ProAir HFA 108 (90 Base) MCG/ACT inhaler Generic drug: albuterol Inhale 2 puffs into the lungs 4 (four) times daily as needed for wheezing or shortness of breath.   tamsulosin 0.4 MG Caps capsule Commonly known as: FLOMAX Take 0.4 mg by mouth daily after breakfast.       Vitals:   06/06/19 1428 06/06/19 1934  BP:  (!) 131/51  Pulse: 81 79  Resp: 18 20  Temp:  98.1 F (36.7 C)  SpO2: 93% 97%    Skin clean, dry and intact without evidence of skin break down, no evidence of skin tears noted. IV catheter discontinued intact. Site without signs and symptoms of complications. Dressing and pressure applied. Pt denies pain at this time. No complaints  noted.  An After Visit Summary was printed and given to the patient. Patient escorted via stretcher, and D/C to [facility] via EMS.  Douglas Mathis

## 2019-06-06 NOTE — TOC Progression Note (Addendum)
Transition of Care [facility]) - Progression Note    Patient Details  Name: Douglas Mathis MRN: [id number] Date of Birth: 1935-04-20  [facility] CM/SW Contact  Margarito Liner, LCSW Phone Number: 06/06/2019, 11:20 AM  Clinical Narrative:  Insurance authorization still pending. DNR is signed and on the chart. Faxed COVID vaccine card to SNF admissions coordinator.   12:20 pm: According to [employer] pre-service coordinator, Aram Beecham, patient is approved but they did not have facility name. Notified them it is [facility]. They will input the information and call back with authorization details.  1:30 pm: Insurance authorization approved: [id number]. Patient can discharge to [facility] once summary and order are in. Will notify MD.  Expected Discharge Plan: Skilled Nursing Facility Barriers to Discharge: Continued Medical Work up  Expected Discharge Plan and Services Expected Discharge Plan: Skilled Nursing Facility In-house Referral: Clinical Social Work   Post Acute Care Choice: Skilled Nursing Facility Living arrangements for the past 2 months: Apartment                                       Social Determinants of Health (SDOH) Interventions    Readmission Risk Interventions No flowsheet data found.

## 2019-06-06 NOTE — Progress Notes (Signed)
CH visited pt. to assess whether pt. needed help with AD; pt. initially asleep but after conversation w/RN indicating pt. would be discharged to SNF today, RN and chaplain thought it prudent to attempt to facilitate completion of AD before discharge.  Pt. said his dtr. had already filled out form, but when [facility] checked form on pt.'s tray it was blank; pt. said dtr. would be coming to hospital shortly and would complete form w/him then.  CH consulted w/RN --> RN called dtr. while CH contacted [facility] to see if AD completion could be done there; [facility] does not do AD's representative told CH.  CH planned to return to update RN but was not able to follow up.  Secure chat sent by RN informing CH dtr. could not come to hospital due to work but that she would attempt to get AD done @ SNF.    06/06/19 1500  Clinical Encounter Type  Visited With Patient;Health care provider  Visit Type Follow-up;Psychological support;Social support (Advanced Directive completion)

## 2019-06-06 NOTE — TOC Transition Note (Signed)
Transition of Care [facility]) - CM/SW Discharge Note   Patient Details  Name: TAVARIS EUDY MRN: [id number] Date of Birth: [birth year]  Transition of Care [facility]) CM/SW Contact:  Margarito Liner, LCSW Phone Number: 06/06/2019, 4:31 PM   Clinical Narrative: Patient has orders to discharge to [facility] today. RN will call report to [phone]. EMS scheduled. Patient is 6th on the list. No further concerns. CSW signing off.    Final next level of care: Skilled Nursing Facility Barriers to Discharge: Barriers Resolved   Patient Goals and CMS Choice Patient states their goals for this hospitalization and ongoing recovery are:: Pt daughter wants her father to get better CMS Medicare.gov Compare Post Acute Care list provided to:: Patient Choice offered to / list presented to : Adult Children  Discharge Placement PASRR number recieved: 06/04/19            Patient chooses bed at: [facility] Patient to be transferred to facility by: EMS Name of family member notified: Left voicemail for daughter [name] Patient and family notified of of transfer: 06/06/19  Discharge Plan and Services In-house Referral: Clinical Social Work   Post Acute Care Choice: Skilled Nursing Facility                               Social Determinants of Health (SDOH) Interventions     Readmission Risk Interventions No flowsheet data found.

## 2019-06-06 NOTE — Progress Notes (Addendum)
Gave report to [facility]. Per daughter patient only uses O2 and does not use CPAP at home.   Madie Reno, RN

## 2019-06-08 LAB — CULTURE, BLOOD (ROUTINE X 2)
Culture: NO GROWTH
Culture: NO GROWTH

## 2019-06-25 ENCOUNTER — Inpatient Hospital Stay
Admission: EM | Admit: 2019-06-25 | Discharge: 2019-06-29 | DRG: 190 | Disposition: A | Discharge status: Skilled Nursing Facility | Payer: Medicare Other | Attending: Internal Medicine | Admitting: Internal Medicine

## 2019-06-25 ENCOUNTER — Emergency Department: Payer: Medicare Other

## 2019-06-25 ENCOUNTER — Other Ambulatory Visit: Payer: Self-pay

## 2019-06-25 DIAGNOSIS — Z66 Do not resuscitate: Secondary | ICD-10-CM | POA: Diagnosis present

## 2019-06-25 DIAGNOSIS — E878 Other disorders of electrolyte and fluid balance, not elsewhere classified: Secondary | ICD-10-CM | POA: Diagnosis present

## 2019-06-25 DIAGNOSIS — Z20822 Contact with and (suspected) exposure to covid-19: Secondary | ICD-10-CM | POA: Diagnosis present

## 2019-06-25 DIAGNOSIS — Z7951 Long term (current) use of inhaled steroids: Secondary | ICD-10-CM | POA: Diagnosis not present

## 2019-06-25 DIAGNOSIS — J9621 Acute and chronic respiratory failure with hypoxia: Secondary | ICD-10-CM | POA: Diagnosis present

## 2019-06-25 DIAGNOSIS — I110 Hypertensive heart disease with heart failure: Secondary | ICD-10-CM | POA: Diagnosis present

## 2019-06-25 DIAGNOSIS — E871 Hypo-osmolality and hyponatremia: Secondary | ICD-10-CM | POA: Diagnosis present

## 2019-06-25 DIAGNOSIS — J9622 Acute and chronic respiratory failure with hypercapnia: Secondary | ICD-10-CM | POA: Diagnosis present

## 2019-06-25 DIAGNOSIS — F17210 Nicotine dependence, cigarettes, uncomplicated: Secondary | ICD-10-CM | POA: Diagnosis present

## 2019-06-25 DIAGNOSIS — J441 Chronic obstructive pulmonary disease with (acute) exacerbation: Principal | ICD-10-CM | POA: Diagnosis present

## 2019-06-25 DIAGNOSIS — J440 Chronic obstructive pulmonary disease with acute lower respiratory infection: Secondary | ICD-10-CM | POA: Diagnosis present

## 2019-06-25 DIAGNOSIS — J209 Acute bronchitis, unspecified: Secondary | ICD-10-CM | POA: Diagnosis present

## 2019-06-25 DIAGNOSIS — N400 Enlarged prostate without lower urinary tract symptoms: Secondary | ICD-10-CM | POA: Diagnosis present

## 2019-06-25 DIAGNOSIS — E876 Hypokalemia: Secondary | ICD-10-CM | POA: Diagnosis present

## 2019-06-25 DIAGNOSIS — K219 Gastro-esophageal reflux disease without esophagitis: Secondary | ICD-10-CM | POA: Diagnosis present

## 2019-06-25 DIAGNOSIS — Z9981 Dependence on supplemental oxygen: Secondary | ICD-10-CM | POA: Diagnosis not present

## 2019-06-25 DIAGNOSIS — R0602 Shortness of breath: Secondary | ICD-10-CM

## 2019-06-25 DIAGNOSIS — Z716 Tobacco abuse counseling: Secondary | ICD-10-CM

## 2019-06-25 DIAGNOSIS — Z79899 Other long term (current) drug therapy: Secondary | ICD-10-CM

## 2019-06-25 DIAGNOSIS — E860 Dehydration: Secondary | ICD-10-CM | POA: Diagnosis not present

## 2019-06-25 DIAGNOSIS — I10 Essential (primary) hypertension: Secondary | ICD-10-CM | POA: Diagnosis present

## 2019-06-25 DIAGNOSIS — I5032 Chronic diastolic (congestive) heart failure: Secondary | ICD-10-CM | POA: Diagnosis present

## 2019-06-25 DIAGNOSIS — Z825 Family history of asthma and other chronic lower respiratory diseases: Secondary | ICD-10-CM | POA: Diagnosis not present

## 2019-06-25 DIAGNOSIS — I5033 Acute on chronic diastolic (congestive) heart failure: Secondary | ICD-10-CM | POA: Diagnosis present

## 2019-06-25 LAB — CBC WITH DIFFERENTIAL/PLATELET
Abs Immature Granulocytes: 0.19 10*3/uL — ABNORMAL HIGH (ref 0.00–0.07)
Basophils Absolute: 0.1 10*3/uL (ref 0.0–0.1)
Basophils Relative: 1 %
Eosinophils Absolute: 0 10*3/uL (ref 0.0–0.5)
Eosinophils Relative: 0 %
HCT: 28.4 % — ABNORMAL LOW (ref 39.0–52.0)
Hemoglobin: 8.5 g/dL — ABNORMAL LOW (ref 13.0–17.0)
Immature Granulocytes: 1 %
Lymphocytes Relative: 6 %
Lymphs Abs: 0.8 10*3/uL (ref 0.7–4.0)
MCH: 27.3 pg (ref 26.0–34.0)
MCHC: 29.9 g/dL — ABNORMAL LOW (ref 30.0–36.0)
MCV: 91.3 fL (ref 80.0–100.0)
Monocytes Absolute: 1.3 10*3/uL — ABNORMAL HIGH (ref 0.1–1.0)
Monocytes Relative: 9 %
Neutro Abs: 12.2 10*3/uL — ABNORMAL HIGH (ref 1.7–7.7)
Neutrophils Relative %: 83 %
Platelets: 331 10*3/uL (ref 150–400)
RBC: 3.11 MIL/uL — ABNORMAL LOW (ref 4.22–5.81)
RDW: 19.7 % — ABNORMAL HIGH (ref 11.5–15.5)
WBC: 14.6 10*3/uL — ABNORMAL HIGH (ref 4.0–10.5)
nRBC: 0 % (ref 0.0–0.2)

## 2019-06-25 LAB — COMPREHENSIVE METABOLIC PANEL
ALT: 11 U/L (ref 0–44)
AST: 13 U/L — ABNORMAL LOW (ref 15–41)
Albumin: 3.4 g/dL — ABNORMAL LOW (ref 3.5–5.0)
Alkaline Phosphatase: 46 U/L (ref 38–126)
Anion gap: 9 (ref 5–15)
BUN: 19 mg/dL (ref 8–23)
CO2: 34 mmol/L — ABNORMAL HIGH (ref 22–32)
Calcium: 8.3 mg/dL — ABNORMAL LOW (ref 8.9–10.3)
Chloride: 90 mmol/L — ABNORMAL LOW (ref 98–111)
Creatinine, Ser: 0.64 mg/dL (ref 0.61–1.24)
GFR calc Af Amer: >60 mL/min (ref >60)
GFR calc non Af Amer: >60 mL/min (ref >60)
Glucose, Bld: 134 mg/dL — ABNORMAL HIGH (ref 70–99)
Potassium: 4.7 mmol/L (ref 3.5–5.1)
Sodium: 133 mmol/L — ABNORMAL LOW (ref 135–145)
Total Bilirubin: 0.7 mg/dL (ref 0.3–1.2)
Total Protein: 6.5 g/dL (ref 6.5–8.1)

## 2019-06-25 LAB — TROPONIN I (HIGH SENSITIVITY)
Troponin I (High Sensitivity): 9 ng/L (ref <18)
Troponin I (High Sensitivity): 10 ng/L (ref <18)

## 2019-06-25 LAB — BLOOD GAS, VENOUS
Acid-Base Excess: 14.6 mmol/L — ABNORMAL HIGH (ref 0.0–2.0)
Bicarbonate: 44.2 mmol/L — ABNORMAL HIGH (ref 20.0–28.0)
O2 Saturation: 72 %
Patient temperature: 37
pCO2, Ven: 80 mmHg (ref 44.0–60.0)
pH, Ven: 7.35 (ref 7.250–7.430)
pO2, Ven: 40 mmHg (ref 32.0–45.0)

## 2019-06-25 LAB — RESPIRATORY PANEL BY RT PCR (FLU A&B, COVID)
Influenza A by PCR: NEGATIVE
Influenza B by PCR: NEGATIVE
SARS Coronavirus 2 by RT PCR: NEGATIVE

## 2019-06-25 LAB — BRAIN NATRIURETIC PEPTIDE: B Natriuretic Peptide: 228 pg/mL — ABNORMAL HIGH (ref 0.0–100.0)

## 2019-06-25 MED ORDER — IPRATROPIUM-ALBUTEROL 0.5-2.5 (3) MG/3ML IN SOLN: 3.0000 mL | Freq: Four times a day (QID) | RESPIRATORY_TRACT | Status: DC | PRN

## 2019-06-25 MED ORDER — ENSURE ENLIVE PO LIQD
237.0000 mL | Freq: Three times a day (TID) | ORAL | Status: DC
  Administered 2019-06-25 – 2019-06-29 (×11): 237 mL via ORAL

## 2019-06-25 MED ORDER — FLUTICASONE FUROATE-VILANTEROL 200-25 MCG/INH IN AEPB
1.0000 | INHALATION_SPRAY | Freq: Every day | RESPIRATORY_TRACT | Status: DC
  Administered 2019-06-26 – 2019-06-29 (×4): 1 via RESPIRATORY_TRACT
  Filled 2019-06-25: qty 28

## 2019-06-25 MED ORDER — MONTELUKAST SODIUM 10 MG PO TABS
10.0000 mg | ORAL_TABLET | Freq: Every day | ORAL | Status: DC
  Administered 2019-06-25 – 2019-06-29 (×5): 10 mg via ORAL
  Filled 2019-06-25 (×5): qty 1

## 2019-06-25 MED ORDER — CARVEDILOL 3.125 MG PO TABS
6.2500 mg | ORAL_TABLET | Freq: Two times a day (BID) | ORAL | Status: DC
  Administered 2019-06-25 – 2019-06-29 (×8): 6.25 mg via ORAL
  Filled 2019-06-25 (×8): qty 2

## 2019-06-25 MED ORDER — IPRATROPIUM-ALBUTEROL 0.5-2.5 (3) MG/3ML IN SOLN
3.0000 mL | Freq: Once | RESPIRATORY_TRACT | Status: AC
  Administered 2019-06-25: 3 mL via RESPIRATORY_TRACT
  Filled 2019-06-25: qty 3

## 2019-06-25 MED ORDER — FERROUS SULFATE 325 (65 FE) MG PO TABS
325.0000 mg | ORAL_TABLET | Freq: Two times a day (BID) | ORAL | Status: DC
  Administered 2019-06-25 – 2019-06-29 (×8): 325 mg via ORAL
  Filled 2019-06-25 (×9): qty 1

## 2019-06-25 MED ORDER — METHYLPREDNISOLONE SODIUM SUCC 40 MG IJ SOLR
40.0000 mg | Freq: Two times a day (BID) | INTRAMUSCULAR | Status: AC
  Administered 2019-06-25 – 2019-06-26 (×2): 40 mg via INTRAVENOUS
  Filled 2019-06-25 (×2): qty 1

## 2019-06-25 MED ORDER — ENOXAPARIN SODIUM 40 MG/0.4ML ~~LOC~~ SOLN
40.0000 mg | SUBCUTANEOUS | Status: DC
  Administered 2019-06-25 – 2019-06-28 (×4): 40 mg via SUBCUTANEOUS
  Filled 2019-06-25 (×4): qty 0.4

## 2019-06-25 MED ORDER — TAMSULOSIN HCL 0.4 MG PO CAPS
0.4000 mg | ORAL_CAPSULE | Freq: Every day | ORAL | Status: DC
  Administered 2019-06-26 – 2019-06-29 (×4): 0.4 mg via ORAL
  Filled 2019-06-25 (×4): qty 1

## 2019-06-25 MED ORDER — ORAL CARE MOUTH RINSE
15.0000 mL | Freq: Two times a day (BID) | OROMUCOSAL | Status: DC
  Administered 2019-06-25 – 2019-06-28 (×7): 15 mL via OROMUCOSAL

## 2019-06-25 MED ORDER — SODIUM CHLORIDE 0.9 % IV SOLN
500.0000 mg | Freq: Once | INTRAVENOUS | Status: AC
  Administered 2019-06-25: 500 mg via INTRAVENOUS
  Filled 2019-06-25: qty 500

## 2019-06-25 MED ORDER — IPRATROPIUM BROMIDE 0.02 % IN SOLN
0.5000 mg | Freq: Four times a day (QID) | RESPIRATORY_TRACT | Status: DC
Start: 2019-06-25 — End: 2019-06-25

## 2019-06-25 MED ORDER — ASCORBIC ACID 500 MG PO TABS
500.0000 mg | ORAL_TABLET | Freq: Two times a day (BID) | ORAL | Status: DC
  Administered 2019-06-25 – 2019-06-29 (×8): 500 mg via ORAL
  Filled 2019-06-25 (×8): qty 1

## 2019-06-25 MED ORDER — UMECLIDINIUM BROMIDE 62.5 MCG/INH IN AEPB
1.0000 | INHALATION_SPRAY | Freq: Every day | RESPIRATORY_TRACT | Status: DC
  Administered 2019-06-26 – 2019-06-29 (×4): 1 via RESPIRATORY_TRACT
  Filled 2019-06-25: qty 7

## 2019-06-25 MED ORDER — AMLODIPINE BESYLATE 10 MG PO TABS
10.0000 mg | ORAL_TABLET | Freq: Every day | ORAL | Status: DC
  Administered 2019-06-25: 10 mg via ORAL
  Filled 2019-06-25: qty 1
  Filled 2019-06-25: qty 2

## 2019-06-25 MED ORDER — DOXYCYCLINE HYCLATE 100 MG PO TABS
100.0000 mg | ORAL_TABLET | Freq: Two times a day (BID) | ORAL | Status: DC
  Administered 2019-06-25 – 2019-06-29 (×8): 100 mg via ORAL
  Filled 2019-06-25 (×8): qty 1

## 2019-06-25 MED ORDER — IPRATROPIUM-ALBUTEROL 0.5-2.5 (3) MG/3ML IN SOLN
3.0000 mL | Freq: Once | RESPIRATORY_TRACT | Status: AC
  Administered 2019-06-25: 3 mL via RESPIRATORY_TRACT

## 2019-06-25 MED ORDER — PANTOPRAZOLE SODIUM 40 MG PO TBEC
40.0000 mg | DELAYED_RELEASE_TABLET | Freq: Every day | ORAL | Status: DC
  Administered 2019-06-25 – 2019-06-29 (×5): 40 mg via ORAL
  Filled 2019-06-25 (×5): qty 1

## 2019-06-25 MED ORDER — PREDNISONE 20 MG PO TABS: 40.0000 mg | ORAL_TABLET | Freq: Every day | ORAL | Status: DC

## 2019-06-25 MED ORDER — FLUTICASONE-UMECLIDIN-VILANT 100-62.5-25 MCG/INH IN AEPB: 1.0000 | INHALATION_SPRAY | Freq: Every day | RESPIRATORY_TRACT | Status: DC

## 2019-06-25 MED ORDER — LISINOPRIL 5 MG PO TABS
5.0000 mg | ORAL_TABLET | Freq: Every day | ORAL | Status: DC
  Administered 2019-06-25: 17:00:00 5 mg via ORAL
  Filled 2019-06-25 (×2): qty 1

## 2019-06-25 MED ORDER — SODIUM CHLORIDE 0.9 % IV SOLN
1.0000 g | Freq: Once | INTRAVENOUS | Status: AC
  Administered 2019-06-25: 1 g via INTRAVENOUS
  Filled 2019-06-25: qty 10

## 2019-06-25 MED ORDER — ADULT MULTIVITAMIN W/MINERALS CH
1.0000 | ORAL_TABLET | Freq: Every day | ORAL | Status: DC
  Administered 2019-06-25 – 2019-06-29 (×5): 1 via ORAL
  Filled 2019-06-25 (×5): qty 1

## 2019-06-25 MED ORDER — SODIUM CHLORIDE 0.9 % IV SOLN: INTRAVENOUS | Status: DC

## 2019-06-25 MED ORDER — PRAVASTATIN SODIUM 20 MG PO TABS
40.0000 mg | ORAL_TABLET | Freq: Every day | ORAL | Status: DC
  Administered 2019-06-25 – 2019-06-28 (×4): 40 mg via ORAL
  Filled 2019-06-25: qty 2
  Filled 2019-06-25: qty 1
  Filled 2019-06-25 (×2): qty 2

## 2019-06-25 MED ORDER — ERYTHROMYCIN 5 MG/GM OP OINT
TOPICAL_OINTMENT | Freq: Three times a day (TID) | OPHTHALMIC | Status: DC
  Administered 2019-06-25 – 2019-06-29 (×11): 1 via OPHTHALMIC
  Filled 2019-06-25 (×2): qty 1

## 2019-06-25 NOTE — ED Notes (Signed)
Daughter notified of admission status, transportation requested.

## 2019-06-25 NOTE — H&P (Signed)
History and Physical    Douglas Mathis:[id number] DOB: 1935-04-07 DOA: 06/25/2019  PCP: [facility]   Patient coming from: [facility]  I have personally briefly reviewed patient's old medical records in [facility]  Chief Complaint: Shortness of breath  HPI: Douglas Mathis is a 84 y.o. male with medical history significant for COPD with chronic respiratory failure on 2 to 3 L of oxygen via nasal cannula, history of congestive heart failure who was brought into the emergency room via EMS for evaluation of shortness of breath.  Symptoms started this morning and patient states that he is more short of breath when compared to his baseline.  Shortness of breath is associated with cough productive of yellow phlegm.  He denies having any chest pain, fever, nausea, vomiting, diarrhea. Per EMS his pulse oximetry was noted to be in the low 90s on his baseline oxygen of 2 L and he was placed on 4 L of oxygen with improvement in his pulse oximetry to the upper 90s. Chest x-ray shows chronic changes within the lung bases with possible slight worsening of right infrahilar bronchovascular markings suggesting possible acute bronchitic process.  ED Course: Patient is an 84 year old male with a history of COPD and chronic respiratory failure who presents to the emergency room for evaluation of worsening shortness of breath from his baseline.  Patient has an increased oxygen requirement and is currently on 4 L of oxygen via nasal cannula to maintain pulse oximetry greater than 92%.  Patient received bronchodilators and Solu-Medrol in the emergency room and will be admitted to the hospital for further evaluation.  Review of Systems: As per HPI otherwise 10 point review of systems negative.    Past Medical History:  Diagnosis Date  . Anemia   . BPH (benign prostatic hyperplasia)   . CHF (congestive heart failure) (HCC)   . COPD (chronic obstructive pulmonary disease)  (HCC)   . Hypercholesterolemia   . Hypertension     Past Surgical History:  Procedure Laterality Date  . cataracts    . ESOPHAGOGASTRODUODENOSCOPY N/A 07/10/2015   Procedure: ESOPHAGOGASTRODUODENOSCOPY (EGD);  Surgeon: Scot Jun, MD;  Location: [facility] ENDOSCOPY;  Service: Endoscopy;  Laterality: N/A;  . ESOPHAGOGASTRODUODENOSCOPY N/A 01/18/2018   Procedure: ESOPHAGOGASTRODUODENOSCOPY (EGD);  Surgeon: Toledo, Boykin Nearing, MD;  Location: [facility] ENDOSCOPY;  Service: Gastroenterology;  Laterality: N/A;     reports that he has been smoking. He has been smoking about 0.50 packs per day. He has never used smokeless tobacco. He reports previous alcohol use. He reports that he does not use drugs.  No Known Allergies  Family History  Problem Relation Age of Onset  . Diabetes Mother   . CAD Mother   . Bone cancer Brother   . Bronchitis Father      Prior to Admission medications   Medication Sig Start Date End Date Taking? Authorizing Provider  amLODipine (NORVASC) 10 MG tablet Take 1 tablet (10 mg total) by mouth daily for 30 days. 03/23/18 06/05/19  Ihor Austin, MD  carvedilol (COREG) 6.25 MG tablet Take 1 tablet (6.25 mg total) by mouth 2 (two) times daily. 03/22/18 06/05/19  Ihor Austin, MD  erythromycin ophthalmic ointment Place into both eyes every 8 (eight) hours. 06/06/19   Arnetha Courser, MD  feeding supplement, ENSURE ENLIVE, (ENSURE ENLIVE) LIQD Take 237 mLs by mouth 2 (two) times daily between meals. 03/22/18   Ihor Austin, MD  feeding supplement, ENSURE ENLIVE, (ENSURE ENLIVE) LIQD Take 237  mLs by mouth 3 (three) times daily between meals. 06/06/19   Lorella Nimrod, MD  ferrous sulfate 325 (65 FE) MG tablet Take 1 tablet (325 mg total) by mouth 2 (two) times daily with a meal. 07/22/15   Loletha Grayer, MD  Fluticasone-Umeclidin-Vilant 100-62.5-25 MCG/INH AEPB Inhale 1 puff into the lungs daily. 12/13/17   [provider]  GNP VITAMIN C 500 MG tablet Take 500 mg by  mouth 2 (two) times daily. 09/22/17   [provider]  lisinopril (PRINIVIL,ZESTRIL) 5 MG tablet Take 1 tablet (5 mg total) by mouth daily for 30 days. 03/22/18 06/05/19  Saundra Shelling, MD  montelukast (SINGULAIR) 10 MG tablet Take 10 mg by mouth daily. 10/22/17   [provider]  Multiple Vitamin (MULTIVITAMIN WITH MINERALS) TABS tablet Take 1 tablet by mouth daily. 06/07/19   Lorella Nimrod, MD  pantoprazole (PROTONIX) 40 MG tablet Take 40 mg by mouth daily.  12/08/17   [provider]  pravastatin (PRAVACHOL) 40 MG tablet Take 1 tablet (40 mg total) by mouth daily at 6 PM. 06/06/19   Lorella Nimrod, MD  PROAIR HFA 108 [phone] Base) MCG/ACT inhaler Inhale 2 puffs into the lungs 4 (four) times daily as needed for wheezing or shortness of breath.  08/04/17   [provider]  tamsulosin (FLOMAX) 0.4 MG CAPS capsule Take 0.4 mg by mouth daily after breakfast.     [provider]    Physical Exam: Vitals:   06/25/19 1430 06/25/19 1500 06/25/19 1530 06/25/19 1630  BP: (!) 117/49 (!) 121/51 (!) 105/51   Pulse: 77 78 82 76  Resp: (!) 25 (!) 21 (!) 25 (!) 27  Temp:      TempSrc:      SpO2: 100% 100% 94% 100%  Weight:      Height:         Vitals:   06/25/19 1430 06/25/19 1500 06/25/19 1530 06/25/19 1630  BP: (!) 117/49 (!) 121/51 (!) 105/51   Pulse: 77 78 82 76  Resp: (!) 25 (!) 21 (!) 25 (!) 27  Temp:      TempSrc:      SpO2: 100% 100% 94% 100%  Weight:      Height:        Constitutional: NAD, alert and oriented x 3. Chronically ill appearing Eyes: PERRL, lids and conjunctivae normal ENMT: Mucous membranes are moist.  Neck: normal, supple, no masses, no thyromegaly Respiratory: Bilateral air entry, scattered wheezing, no crackles. Normal respiratory effort. No accessory muscle use.  Cardiovascular: Regular rate and rhythm, no murmurs / rubs / gallops. No extremity edema. 2+ pedal pulses. No carotid bruits.  Abdomen: no tenderness, no masses  palpated. No hepatosplenomegaly. Bowel sounds positive.  Musculoskeletal: no clubbing / cyanosis. No joint deformity upper and lower extremities.  Skin: no rashes, lesions, ulcers.  Neurologic: No gross focal neurologic deficit. Psychiatric: Normal mood and affect.   Labs on Admission: I have personally reviewed following labs and imaging studies  CBC: Recent Labs  Lab 06/25/19 1255  WBC 14.6*  NEUTROABS 12.2*  HGB 8.5*  HCT 28.4*  MCV 91.3  PLT 948   Basic Metabolic Panel: Recent Labs  Lab 06/25/19 1255  NA 133*  K 4.7  CL 90*  CO2 34*  GLUCOSE 134*  BUN 19  CREATININE 0.64  CALCIUM 8.3*   GFR: Estimated Creatinine Clearance: 62.7 mL/min (by C-G formula based on SCr of 0.64 mg/dL). Liver Function Tests: Recent Labs  Lab 06/25/19 1255  AST 13*  ALT 11  ALKPHOS 46  BILITOT 0.7  PROT 6.5  ALBUMIN 3.4*   No results for input(s): LIPASE, AMYLASE in the last 168 hours. No results for input(s): AMMONIA in the last 168 hours. Coagulation Profile: No results for input(s): INR, PROTIME in the last 168 hours. Cardiac Enzymes: No results for input(s): CKTOTAL, CKMB, CKMBINDEX, TROPONINI in the last 168 hours. BNP (last 3 results) No results for input(s): PROBNP in the last 8760 hours. HbA1C: No results for input(s): HGBA1C in the last 72 hours. CBG: No results for input(s): GLUCAP in the last 168 hours. Lipid Profile: No results for input(s): CHOL, HDL, LDLCALC, TRIG, CHOLHDL, LDLDIRECT in the last 72 hours. Thyroid Function Tests: No results for input(s): TSH, T4TOTAL, FREET4, T3FREE, THYROIDAB in the last 72 hours. Anemia Panel: No results for input(s): VITAMINB12, FOLATE, FERRITIN, TIBC, IRON, RETICCTPCT in the last 72 hours. Urine analysis:    Component Value Date/Time   COLORURINE YELLOW (A) 06/02/2019 2237   APPEARANCEUR HAZY (A) 06/02/2019 2237   APPEARANCEUR Cloudy 10/05/2013 0414   LABSPEC 1.009 06/02/2019 2237   LABSPEC 1.017 10/05/2013 0414    PHURINE 5.0 06/02/2019 2237   GLUCOSEU NEGATIVE 06/02/2019 2237   GLUCOSEU Negative 10/05/2013 0414   HGBUR SMALL (A) 06/02/2019 2237   BILIRUBINUR NEGATIVE 06/02/2019 2237   BILIRUBINUR Negative 10/05/2013 0414   KETONESUR NEGATIVE 06/02/2019 2237   PROTEINUR NEGATIVE 06/02/2019 2237   NITRITE NEGATIVE 06/02/2019 2237   LEUKOCYTESUR MODERATE (A) 06/02/2019 2237   LEUKOCYTESUR 3+ 10/05/2013 0414    Radiological Exams on Admission: DG Chest Port 1 View  Result Date: 06/25/2019 CLINICAL DATA:  Shortness of breath beginning this morning. EXAM: PORTABLE CHEST 1 VIEW COMPARISON:  06/02/2019 and 04/01/2019 FINDINGS: Patient is slightly rotated to the left. Lungs are adequately inflated with minimal chronic streaky density left base. Mild increased prominence of the bronchovascular markings in the right infrahilar region compared to baseline suggesting possible acute bronchitic process. No effusion or lobar consolidation. Cardiomediastinal silhouette and remainder the exam is unchanged. IMPRESSION: Chronic changes within the lung bases with possible slight worsening of right infrahilar bronchovascular markings suggesting possible acute bronchitic process. Electronically Signed   By: Elberta Fortis M.D.   On: 06/25/2019 13:58    EKG: Independently reviewed. Sinus rhythm Right bundle branch block with left anterior fascicular block  Assessment/Plan Principal Problem:   COPD exacerbation (HCC) Active Problems:   Chronic diastolic (congestive) heart failure (HCC)   HTN (hypertension)   COPD with acute bronchitis (HCC)   GERD (gastroesophageal reflux disease)   BPH (benign prostatic hyperplasia)    COPD with acute bronchitis Patient with a history of COPD and chronic respiratory failure who presents to the emergency room for worsening shortness of breath associated with a cough productive of yellow phlegm. Patient also has an increased oxygen requirement and is currently on 4 L of oxygen, at  baseline he is on 2 L of oxygen We will place patient on doxycycline 100 mg BID Continue scheduled and as needed bronchodilator therapy as well as inhaled steroids Maintain pulse oximetry greater than 92%.   Hypertension Continue amlodipine, lisinopril and carvedilol   Chronic diastolic dysfunction CHF Stable and not in acute exacerbation Continue lisinopril and carvedilol  BPH Continue Flomax  GERD  Continue Protonix  DVT prophylaxis: Lovenox Code Status: DO NOT RESUSCITATE Family Communication: Greater than 50% of time was spent discussing patient's condition and plan of care with his daughter Ms. [name] over the phone.  All questions and concerns were addressed.  Patient will be placed on a DO NOT RESUSCITATE status per daughter's request Disposition Plan: Back to previous home environment Consults called: None    Mitsugi Schrader MD [facility]     06/25/2019, 4:44 PM

## 2019-06-25 NOTE — ED Triage Notes (Signed)
Pt from [facility] c/o SOB that started this morning. Pt was hospitalized for same s/s 1 wk ago. Per ems lung sounds were diminished, was given 125 of solu medrol and a duoneb and 4 liters [place] applied. Pt on 2 liter's oxygen normally for COPD.  Pt is AOx4, nad noted. Coarse crackles noted in lung bases bilaterally.

## 2019-06-25 NOTE — ED Notes (Signed)
Called heads up at this time, RN provided extension for call-back with questions.

## 2019-06-25 NOTE — ED Notes (Signed)
Pt changed in to hospital gown, clean chux applied.

## 2019-06-25 NOTE — ED Notes (Signed)
Attempted to call daughter Corrie Dandy) without response.

## 2019-06-25 NOTE — ED Notes (Addendum)
Daughter called back, update provided. Daughter states she can take pt home if discharged, would like update if admitted. Daughter states pt is normally on 3 liter oxygen.

## 2019-06-25 NOTE — ED Provider Notes (Signed)
[facility] Emergency Department Provider Note  ____________________________________________   First MD Initiated Contact with Patient 06/25/19 1259     (approximate)  I have reviewed the triage vital signs and the nursing notes.  History  Chief Complaint Shortness of Breath    HPI Douglas Mathis is a 84 y.o. male with a history of HF, COPD on baseline 2 L nasal cannula, who presents to the emergency department via EMS for shortness of breath.  Patient states symptoms started this morning.  Feels more short of breath than his baseline.  Also reports slightly increased productive cough.  Denies any chest pain, fevers, nausea, vomiting, diarrhea.  Has been compliant with his medications.  Denies feeling fluid overloaded.  Per EMS, lung sounds were diminished, given 125 mg Solu-Medrol, and a DuoNeb.  On EMS arrival oxygen was noted to be in the low 90s on his baseline 2 L, increased to 4 L nasal cannula.   Past Medical Hx Past Medical History:  Diagnosis Date  . Anemia   . BPH (benign prostatic hyperplasia)   . CHF (congestive heart failure) (HCC)   . COPD (chronic obstructive pulmonary disease) (HCC)   . Hypercholesterolemia   . Hypertension     Problem List Patient Active Problem List   Diagnosis Date Noted  . Hypotension   . Acute on chronic respiratory failure with hypercapnia (HCC)   . Hyponatremia   . Fall   . Generalized weakness   . Syncope   . Acute respiratory failure with hypoxia and hypercarbia (HCC) 06/03/2019  . Shock (HCC) 06/03/2019  . Chronic diastolic (congestive) heart failure (HCC) 03/31/2018  . HTN (hypertension) 03/31/2018  . Eyelid edema, left 03/31/2018  . COPD (chronic obstructive pulmonary disease) (HCC) 02/26/2018  . COPD exacerbation (HCC) 10/25/2017    Past Surgical Hx Past Surgical History:  Procedure Laterality Date  . cataracts    . ESOPHAGOGASTRODUODENOSCOPY N/A 07/10/2015   Procedure:  ESOPHAGOGASTRODUODENOSCOPY (EGD);  Surgeon: Scot Jun, MD;  Location: [facility] ENDOSCOPY;  Service: Endoscopy;  Laterality: N/A;  . ESOPHAGOGASTRODUODENOSCOPY N/A 01/18/2018   Procedure: ESOPHAGOGASTRODUODENOSCOPY (EGD);  Surgeon: Toledo, Boykin Nearing, MD;  Location: [facility] ENDOSCOPY;  Service: Gastroenterology;  Laterality: N/A;    Medications Prior to Admission medications   Medication Sig Start Date End Date Taking? Authorizing Provider  amLODipine (NORVASC) 10 MG tablet Take 1 tablet (10 mg total) by mouth daily for 30 days. 03/23/18 06/05/19  Ihor Austin, MD  carvedilol (COREG) 6.25 MG tablet Take 1 tablet (6.25 mg total) by mouth 2 (two) times daily. 03/22/18 06/05/19  Ihor Austin, MD  erythromycin ophthalmic ointment Place into both eyes every 8 (eight) hours. 06/06/19   Arnetha Courser, MD  feeding supplement, ENSURE ENLIVE, (ENSURE ENLIVE) LIQD Take 237 mLs by mouth 2 (two) times daily between meals. 03/22/18   Ihor Austin, MD  feeding supplement, ENSURE ENLIVE, (ENSURE ENLIVE) LIQD Take 237 mLs by mouth 3 (three) times daily between meals. 06/06/19   Arnetha Courser, MD  ferrous sulfate 325 (65 FE) MG tablet Take 1 tablet (325 mg total) by mouth 2 (two) times daily with a meal. 07/22/15   Alford Highland, MD  Fluticasone-Umeclidin-Vilant 100-62.5-25 MCG/INH AEPB Inhale 1 puff into the lungs daily. 12/13/17   [provider]  GNP VITAMIN C 500 MG tablet Take 500 mg by mouth 2 (two) times daily. 09/22/17   [provider]  lisinopril (PRINIVIL,ZESTRIL) 5 MG tablet Take 1 tablet (5 mg total) by mouth daily for 30  days. 03/22/18 06/05/19  Saundra Shelling, MD  montelukast (SINGULAIR) 10 MG tablet Take 10 mg by mouth daily. 10/22/17   [provider]  Multiple Vitamin (MULTIVITAMIN WITH MINERALS) TABS tablet Take 1 tablet by mouth daily. 06/07/19   Lorella Nimrod, MD  pantoprazole (PROTONIX) 40 MG tablet Take 40 mg by mouth daily.  12/08/17   [provider]   pravastatin (PRAVACHOL) 40 MG tablet Take 1 tablet (40 mg total) by mouth daily at 6 PM. 06/06/19   Lorella Nimrod, MD  PROAIR HFA 108 [phone] Base) MCG/ACT inhaler Inhale 2 puffs into the lungs 4 (four) times daily as needed for wheezing or shortness of breath.  08/04/17   [provider]  tamsulosin (FLOMAX) 0.4 MG CAPS capsule Take 0.4 mg by mouth daily after breakfast.     [provider]    Allergies Patient has no known allergies.  Family Hx Family History  Problem Relation Age of Onset  . Diabetes Mother   . CAD Mother   . Bone cancer Brother   . Bronchitis Father     Social Hx Social History   Tobacco Use  . Smoking status: Current Every Day Smoker    Packs/day: 0.50  . Smokeless tobacco: Never Used  Substance Use Topics  . Alcohol use: Not Currently    Comment: used to drink a bottle on the weekend with friends  . Drug use: Never     Review of Systems  Constitutional: Negative for fever. Negative for chills. Eyes: Negative for visual changes. ENT: Negative for sore throat. Cardiovascular: Negative for chest pain. Respiratory: + for shortness of breath. Gastrointestinal: Negative for nausea. Negative for vomiting.  Genitourinary: Negative for dysuria. Musculoskeletal: Negative for leg swelling. Skin: Negative for rash. Neurological: Negative for headaches.   Physical Exam  Vital Signs: ED Triage Vitals  Enc Vitals Group     BP 06/25/19 1255 (!) 113/53     Pulse Rate 06/25/19 1255 83     Resp 06/25/19 1255 20     Temp 06/25/19 1256 99.6 F (37.6 C)     Temp Source 06/25/19 1256 Oral     SpO2 06/25/19 1254 97 %     Weight --      Height --      Head Circumference --      Peak Flow --      Pain Score 06/25/19 1256 0     Pain Loc --      Pain Edu? --      Excl. in [place]? --     Constitutional: Alert and oriented. NAD.  Head: Normocephalic. Atraumatic. Eyes: Conjunctivae clear. Sclera anicteric. Pupils equal and symmetric. Nose: No  masses or lesions. No congestion or rhinorrhea. Mouth/Throat: Wearing mask.  Neck: No stridor. Trachea midline.  Cardiovascular: Normal rate, regular rhythm. Extremities well perfused. Respiratory: Slightly increased respiratory effort, RR low 20s.  Coarse sounds bilaterally, most pronounced at the bases, particularly on R. On 4 L [place].  Gastrointestinal: Soft. Non-distended. Non-tender.  Genitourinary: Deferred. Musculoskeletal: Trace, if any, pre-tibial edema. No deformities. Neurologic:  Normal speech and language. No gross focal or lateralizing neurologic deficits are appreciated.  Skin: Skin is warm, dry and intact. No rash noted. Psychiatric: Mood and affect are appropriate for situation.  EKG  Personally reviewed and interpreted by myself.   Date: 06/25/19 Time: 1258 Rate: 82 Rhythm: sinus Axis: LAD Intervals: Wide QRS 2/2 IVCD, RBBB, LAFB Appears grossly unchanged compared to prior No STEMI  Radiology  Personally reviewed available imaging myself.   CXR - IMPRESSION:  Chronic changes within the lung bases with possible slight worsening  of right infrahilar bronchovascular markings suggesting possible  acute bronchitic process.    Procedures  Procedure(s) performed (including critical care):  Procedures   Initial Impression / Assessment and Plan / MDM / ED Course  84 y.o. male who presents to the ED for SOB, hypoxia on his baseline [place], needing increase to 4 L [place] from 2-3  Ddx: COPD exacerbation, pulmonary infection, COVID.  Consider HF exacerbation given his history, but clinically does not appear overtly volume overloaded.  Will plan for labs, nebulizer treatments, imaging.  Anticipate admission given his increased oxygen requirement.  Labs reveal likely acute on chronic hypercarbia, with pCO2 80, normal pH on VBG, and elevated bicarb on chemistry.  Receiving DuoNeb treatments. No AMS, no significant distress, no somnolence, do not feel BiPAP is needed at  this time.  COVID negative.  WBC count 14.6.  CXR concerning for possible acute bronchitic process.  Ceftriaxone and azithromycin ordered.  Will admit.    _______________________________   As part of my medical decision making I have reviewed available labs, radiology tests, reviewed old records/performed chart review.    Final Clinical Impression(s) / ED Diagnosis  Final diagnoses:  SOB (shortness of breath)       Note:  This document was prepared using Dragon voice recognition software and may include unintentional dictation errors.   Miguel Aschoff., MD 06/25/19 [phone]

## 2019-06-25 NOTE — ED Notes (Signed)
Daughter updated and informed of admission status.

## 2019-06-26 ENCOUNTER — Inpatient Hospital Stay: Payer: Medicare Other

## 2019-06-26 DIAGNOSIS — J9621 Acute and chronic respiratory failure with hypoxia: Secondary | ICD-10-CM

## 2019-06-26 DIAGNOSIS — J9622 Acute and chronic respiratory failure with hypercapnia: Secondary | ICD-10-CM

## 2019-06-26 LAB — HIV ANTIBODY (ROUTINE TESTING W REFLEX): HIV Screen 4th Generation wRfx: NONREACTIVE

## 2019-06-26 MED ORDER — IPRATROPIUM-ALBUTEROL 0.5-2.5 (3) MG/3ML IN SOLN
3.0000 mL | Freq: Four times a day (QID) | RESPIRATORY_TRACT | Status: DC
  Administered 2019-06-26 – 2019-06-28 (×9): 3 mL via RESPIRATORY_TRACT
  Filled 2019-06-26 (×9): qty 3

## 2019-06-26 MED ORDER — METHYLPREDNISOLONE SODIUM SUCC 40 MG IJ SOLR
40.0000 mg | Freq: Two times a day (BID) | INTRAMUSCULAR | Status: DC
  Administered 2019-06-26 – 2019-06-28 (×4): 40 mg via INTRAVENOUS
  Filled 2019-06-26 (×4): qty 1

## 2019-06-26 MED ORDER — NICOTINE 14 MG/24HR TD PT24
14.0000 mg | MEDICATED_PATCH | Freq: Every day | TRANSDERMAL | Status: DC
  Administered 2019-06-26 – 2019-06-29 (×4): 14 mg via TRANSDERMAL
  Filled 2019-06-26 (×4): qty 1

## 2019-06-26 NOTE — Progress Notes (Signed)
PROGRESS NOTE    Douglas Mathis  GEX:[id number] DOB: 08-Mar-1935 DOA: 06/25/2019 [facility]CP: [facility]   Chief Complaint  Patient presents with  . Shortness of Breath    Brief Narrative:  84 year old male with COPD with chronic hypoxic and hypercapnic respiratory failure on 2-3 L O2 via nasal cannula, diastolic CHF, ongoing tobacco use presented with increasing shortness of breath since the morning of admission requiring 4 L via nasal cannula.  Admitted for acute on chronic respiratory failure secondary to COPD exacerbation.  Assessment & Plan:   Principal Problem: COPD with acute exacerbation (HCC) Acute on chronic respiratory failure with hypoxia and hypercapnia (HCC) Ongoing tobacco use.  Associated dyspnea with productive cough.  Currently stable on 2 L via nasal cannula.  VBG shows chronic hypercapnia.  Diffuse rhonchi on exam and dyspneic on minimal exertion.  Will switch to IV Solu-Medrol, scheduled nebs, antitussives and empiric doxycycline. Nicotine patch ordered.  Counseled strongly on smoking cessation.   Active Problems:   Chronic diastolic (congestive) heart failure (HCC) Euvolemic.  Continue Coreg  Essential hypertension Low diastolic blood pressure.  Continue Coreg and hold amlodipine and lisinopril.  BPH Continue Flomax  Tobacco abuse Counseled on cessation.  Nicotine patch.    DVT prophylaxis: Subcu Lovenox Code Status: DNR Family Communication: None at bedside Disposition:   Status is: Inpatient  Remains inpatient appropriate because:IV treatments appropriate due to intensity of illness or inability to take PO.  Ongoing COPD exacerbation symptoms requiring IV steroid and scheduled nebs.  Unsafe for discharge at present and continues to need monitoring for >2 midnight.   Dispo: The patient is from: Home              Anticipated d/c is to: Home              Anticipated d/c date is: 2 days              Patient currently is not  medically stable to d/c.        Consultants:   None   Procedures: None   Antimicrobials: Doxycycline   Subjective: Seen and examined.  Reports breathing slightly better since admission but still not near baseline.  Objective: Vitals:   06/25/19 2323 06/26/19 0405 06/26/19 0946 06/26/19 0948  BP: (!) 116/42 (!) 106/54 (!) 107/37 (!) 107/43  Pulse: 69 64 71 69  Resp: 15 14 16    Temp: 98.7 F (37.1 C) 99 F (37.2 C) 98.7 F (37.1 C)   TempSrc: Oral Oral Oral   SpO2: 97% 93% 95%   Weight:      Height:        Intake/Output Summary (Last 24 hours) at 06/26/2019 1152 Last data filed at 06/26/2019 08/26/2019 Gross per 24 hour  Intake --  Output 200 ml  Net -200 ml   Filed Weights   06/25/19 1259  Weight: 70.5 kg    Examination:  General: Elderly male lying in bed in no acute distress HEENT: Moist mucosa, supple neck Chest: Scattered rhonchi bilaterally, no crackles CVS: Normal S1-S2, no murmurs GI: Soft, nondistended, nontender Musculoskeletal: Warm, no edema    Data Reviewed: I have personally reviewed following labs and imaging studies  CBC: Recent Labs  Lab 06/25/19 1255  WBC 14.6*  NEUTROABS 12.2*  HGB 8.5*  HCT 28.4*  MCV 91.3  PLT 331    Basic Metabolic Panel: Recent Labs  Lab 06/25/19 1255  NA 133*  K 4.7  CL 90*  CO2 34*  GLUCOSE 134*  BUN 19  CREATININE 0.64  CALCIUM 8.3*    GFR: Estimated Creatinine Clearance: 62.7 mL/min (by C-G formula based on SCr of 0.64 mg/dL).  Liver Function Tests: Recent Labs  Lab 06/25/19 1255  AST 13*  ALT 11  ALKPHOS 46  BILITOT 0.7  PROT 6.5  ALBUMIN 3.4*    CBG: No results for input(s): GLUCAP in the last 168 hours.   Recent Results (from the past 240 hour(s))  Respiratory Panel by RT PCR (Flu A&B, Covid) - Nasopharyngeal Swab     Status: None   Collection Time: 06/25/19  1:16 PM   Specimen: Nasopharyngeal Swab  Result Value Ref Range Status   SARS Coronavirus 2 by RT PCR NEGATIVE  NEGATIVE Final    Comment: (NOTE) SARS-CoV-2 target nucleic acids are NOT DETECTED. The SARS-CoV-2 RNA is generally detectable in upper respiratoy specimens during the acute phase of infection. The lowest concentration of SARS-CoV-2 viral copies this assay can detect is 131 copies/mL. A negative result does not preclude SARS-Cov-2 infection and should not be used as the sole basis for treatment or other patient management decisions. A negative result may occur with  improper specimen collection/handling, submission of specimen other than nasopharyngeal swab, presence of viral mutation(s) within the areas targeted by this assay, and inadequate number of viral copies (<131 copies/mL). A negative result must be combined with clinical observations, patient history, and epidemiological information. The expected result is Negative. Fact Sheet for Patients:  PinkCheek.be Fact Sheet for Healthcare Providers:  GravelBags.it This test is not yet ap proved or cleared by the [place] FDA and  has been authorized for detection and/or diagnosis of SARS-CoV-2 by FDA under an Emergency Use Authorization (EUA). This EUA will remain  in effect (meaning this test can be used) for the duration of the COVID-19 declaration under Section 564(b)(1) of the Act, 21 U.S.C. section 360bbb-3(b)(1), unless the authorization is terminated or revoked sooner.    Influenza A by PCR NEGATIVE NEGATIVE Final   Influenza B by PCR NEGATIVE NEGATIVE Final    Comment: (NOTE) The Xpert Xpress SARS-CoV-2/FLU/RSV assay is intended as an aid in  the diagnosis of influenza from Nasopharyngeal swab specimens and  should not be used as a sole basis for treatment. Nasal washings and  aspirates are unacceptable for Xpert Xpress SARS-CoV-2/FLU/RSV  testing. Fact Sheet for Patients: PinkCheek.be Fact Sheet for Healthcare  Providers: GravelBags.it This test is not yet approved or cleared by the [place] FDA and  has been authorized for detection and/or diagnosis of SARS-CoV-2 by  FDA under an Emergency Use Authorization (EUA). This EUA will remain  in effect (meaning this test can be used) for the duration of the  Covid-19 declaration under Section 564(b)(1) of the Act, 21  U.S.C. section 360bbb-3(b)(1), unless the authorization is  terminated or revoked. Performed at [facility], [address], [place], [place] [address]          Radiology Studies: [facility] Chest [place] 1 View  Result Date: 06/25/2019 CLINICAL DATA:  Shortness of breath beginning this morning. EXAM: PORTABLE CHEST 1 VIEW COMPARISON:  06/02/2019 and 04/01/2019 FINDINGS: Patient is slightly rotated to the left. Lungs are adequately inflated with minimal chronic streaky density left base. Mild increased prominence of the bronchovascular markings in the right infrahilar region compared to baseline suggesting possible acute bronchitic process. No effusion or lobar consolidation. Cardiomediastinal silhouette and remainder the exam is unchanged. IMPRESSION: Chronic changes within the lung bases with possible  slight worsening of right infrahilar bronchovascular markings suggesting possible acute bronchitic process. Electronically Signed   By: Elberta Fortis M.D.   On: 06/25/2019 13:58        Scheduled Meds: . amLODipine  10 mg Oral Daily  . ascorbic acid  500 mg Oral BID  . carvedilol  6.25 mg Oral BID  . doxycycline  100 mg Oral Q12H  . enoxaparin (LOVENOX) injection  40 mg Subcutaneous Q24H  . erythromycin   Both Eyes Q8H  . feeding supplement (ENSURE ENLIVE)  237 mL Oral TID BM  . ferrous sulfate  325 mg Oral BID WC  . fluticasone furoate-vilanterol  1 puff Inhalation Daily   And  . umeclidinium bromide  1 puff Inhalation Daily  . lisinopril  5 mg Oral Daily  . mouth rinse  15 mL Mouth Rinse BID   . montelukast  10 mg Oral Daily  . multivitamin with minerals  1 tablet Oral Daily  . nicotine  14 mg Transdermal Daily  . pantoprazole  40 mg Oral Daily  . pravastatin  40 mg Oral q1800  . predniSONE  40 mg Oral Q breakfast  . tamsulosin  0.4 mg Oral QPC breakfast   Continuous Infusions:   LOS: 1 day    Time spent:  35 minutes    Sayeed Weatherall, MD [facility]   To contact the attending provider between 7A-7P or the covering provider during after hours 7P-7A, please log into the web site [email or web address] and access using universal [place] password for that web site. If you do not have the password, please call the hospital operator.  06/26/2019, 11:52 AM

## 2019-06-26 NOTE — Progress Notes (Signed)
Initial Nutrition Assessment  DOCUMENTATION CODES:   Not applicable  INTERVENTION:   Ensure Enlive po TID, each supplement provides 350 kcal and 20 grams of protein  MVI daily   NUTRITION DIAGNOSIS:   Increased nutrient needs related to chronic illness(COPD) as evidenced by increased estimated needs  GOAL:   Patient will meet greater than or equal to 90% of their needs  MONITOR:   PO intake, Supplement acceptance, Labs, Weight trends, Skin, I & O's  REASON FOR ASSESSMENT:   Consult Assessment of nutrition requirement/status  ASSESSMENT:   84 y.o. male with medical history significant for COPD with chronic respiratory failure on 2 to 3 L of oxygen via nasal cannula, history of congestive heart failure, BPH and HTN who was brought into the emergency room via EMS for evaluation of shortness of breath.   RD familiar with this patient from his recent admit ~1 month ago. Pt is a poor historian but reports good appetite and oral intake at baseline. Pt was eating 30-100% of his meals during his last admit and drinking Ensure supplements. Recommend continue  supplements and vitamins to help pt meet his estimated needs. Per chart, pt appears weight stable at baseline.   Medications reviewed and include: vitamin C, doxycycline, lovenox, ferrous sulfate, MVI, nicotine, protonix  Labs reviewed: Na 133(L) BNP- 228(H) Iron 24(L), TIBC 340, ferritin 20(L), folate 17.3- 4/11 Wbc- 14.6(H), Hgb 8.5(L), Hct 28.4(L)  NUTRITION - FOCUSED PHYSICAL EXAM:    Most Recent Value  Orbital Region  No depletion  Upper Arm Region  No depletion  Thoracic and Lumbar Region  No depletion  Buccal Region  No depletion  Temple Region  No depletion  Clavicle Bone Region  No depletion  Clavicle and Acromion Bone Region  No depletion  Scapular Bone Region  No depletion  Dorsal Hand  No depletion  Patellar Region  No depletion  Anterior Thigh Region  Mild depletion  Posterior Calf Region  Mild  depletion  Edema (RD Assessment)  None  Hair  Reviewed  Eyes  Reviewed  Mouth  Reviewed  Skin  Reviewed  Nails  Reviewed     Diet Order:   Diet Order            Diet 2 gram sodium Room service appropriate? Yes; Fluid consistency: Thin  Diet effective now             EDUCATION NEEDS:   Education needs have been addressed  Skin:  Skin Assessment: Reviewed RN Assessment  Last BM:  pta  Height:   Ht Readings from Last 1 Encounters:  06/25/19 5\' 3"  (1.6 m)    Weight:   Wt Readings from Last 1 Encounters:  06/25/19 70.5 kg    Ideal Body Weight:  56.3 kg  BMI:  Body mass index is 27.53 kg/m.  Estimated Nutritional Needs:   Kcal:  1600-1800kcal/day  Protein:  80-90g/day  Fluid:  >1.4L/day  08/25/19 MS, RD, LDN Please refer to [facility] for RD and/or RD on-call/weekend/after hours pager

## 2019-06-26 NOTE — Evaluation (Signed)
Physical Therapy Evaluation Patient Details Name: Douglas Mathis MRN: [id number] DOB: 05-Jan-1936 Today's Date: 06/26/2019   History of Present Illness  Pt is an 84 y.o. male presenting to hospital 5/2 with SOB and slightly increased productive cough.  Pt admitted with COPD with acute bronchitis.  PMH includes CHF, COPD on 2 L home O2, anemia, htn, BPH, syncope.  Clinical Impression  Prior to hospital admission, pt reports being ambulatory with AD and lives alone in 3rd floor apt.  Oriented to person, place, and situation; oriented to month and day (not year).  Pt inconsistent with following 1 step commands during session.  Currently pt is SBA semi-supine to sitting edge of bed; CGA to min assist with transfers (vc's and assist especially for safely lining up to sitting surface prior to sitting down), and CGA to ambulate 10 feet with RW (limited distance ambulating to SOB; pt also noted with difficulty managing and staying safely within RW with mobility).  Pt reporting dizziness with ambulation (BP 119/54 after sitting down); O2 sats 86% on 4 L O2 via nasal cannula post ambulation but improved to 94% with sitting rest break.  Pt would benefit from skilled PT to address noted impairments and functional limitations (see below for any additional details).  Upon hospital discharge, pt would benefit from STR.    Follow Up Recommendations SNF    Equipment Recommendations  Rolling walker with 5" wheels    Recommendations for Other Services OT consult     Precautions / Restrictions Precautions Precautions: Fall Restrictions Weight Bearing Restrictions: No      Mobility  Bed Mobility Overal bed mobility: Needs Assistance Bed Mobility: Supine to Sit     Supine to sit: Supervision;HOB elevated     General bed mobility comments: increased effort and time to perform on his own  Transfers Overall transfer level: Needs assistance Equipment used: Rolling walker (2 wheeled) Transfers: Sit  to/from Stand Sit to Stand: Min guard;Min assist         General transfer comment: CGA to stand from bed x2 trials and from recliner x1 trial; mild increased effort to stand noted but overall steady; vc's and assist required to safely sit down on bed and recliner during session  Ambulation/Gait Ambulation/Gait assistance: Min guard Gait Distance (Feet): 10 Feet Assistive device: Rolling walker (2 wheeled)   Gait velocity: decreased   General Gait Details: vc's and assist for managing RW and staying within RW safely  Stairs            Wheelchair Mobility    Modified Rankin (Stroke Patients Only)       Balance Overall balance assessment: Needs assistance;History of Falls Sitting-balance support: No upper extremity supported;Feet supported Sitting balance-Leahy Scale: Good Sitting balance - Comments: steady sitting reaching within BOS   Standing balance support: Single extremity supported Standing balance-Leahy Scale: Fair Standing balance comment: steady static standing with at least single UE support                             Pertinent Vitals/Pain Pain Assessment: No/denies pain  HR stable and WFL throughout treatment session.    Home Living Family/patient expects to be discharged to:: Private residence Living Arrangements: Alone Available Help at Discharge: Family;Available PRN/intermittently Type of Home: Apartment Home Access: Elevator(lives on 3rd floor (pt reports he uses elevator but "can" do stairs))     Home Layout: One level Home Equipment: Walker - standard;Walker - 2 wheels;Bedside  commode;Shower seat;Grab bars - tub/shower;Grab bars - toilet      Prior Function Level of Independence: Independent with assistive device(s);Needs assistance   Gait / Transfers Assistance Needed: Per pt he uses standard walker to ambulate; 2 falls in past 6 months d/t tripping  ADL's / Homemaking Assistance Needed: Pt reports his daughter brings him his  medications        Hand Dominance        Extremity/Trunk Assessment   Upper Extremity Assessment Upper Extremity Assessment: Generalized weakness    Lower Extremity Assessment Lower Extremity Assessment: Generalized weakness    Cervical / Trunk Assessment Cervical / Trunk Assessment: Normal  Communication   Communication: Other (comment)(Difficult to understand at times)  Cognition Arousal/Alertness: Awake/alert Behavior During Therapy: WFL for tasks assessed/performed Overall Cognitive Status: No family/caregiver present to determine baseline cognitive functioning                                 General Comments: Oriented to person, place, and situation; pt reports it was May 3rd 2004; difficulty following one step commands noted during session      General Comments   Nursing cleared pt for participation in physical therapy.  Pt agreeable to PT session.    Exercises  Transfer and gait training   Assessment/Plan    PT Assessment Patient needs continued PT services  PT Problem List Decreased strength;Decreased balance;Decreased activity tolerance;Decreased mobility;Decreased knowledge of use of DME;Cardiopulmonary status limiting activity;Decreased safety awareness       PT Treatment Interventions DME instruction;Therapeutic exercise;Gait training;Balance training;Functional mobility training;Therapeutic activities;Patient/family education    PT Goals (Current goals can be found in the Care Plan section)  Acute Rehab PT Goals Patient Stated Goal: to go home PT Goal Formulation: With patient Time For Goal Achievement: 07/10/19 Potential to Achieve Goals: Fair    Frequency Min 2X/week   Barriers to discharge Decreased caregiver support      Co-evaluation               AM-PAC PT "6 Clicks" Mobility  Outcome Measure Help needed turning from your back to your side while in a flat bed without using bedrails?: A Little Help needed moving  from lying on your back to sitting on the side of a flat bed without using bedrails?: A Little Help needed moving to and from a bed to a chair (including a wheelchair)?: A Little Help needed standing up from a chair using your arms (e.g., wheelchair or bedside chair)?: A Little Help needed to walk in hospital room?: A Little Help needed climbing 3-5 steps with a railing? : Total 6 Click Score: 16    End of Session Equipment Utilized During Treatment: Gait belt;Oxygen(4 L O2 via nasal cannula) Activity Tolerance: Other (comment)(Limited d/t SOB with activity) Patient left: in chair;with call bell/phone within reach;with chair alarm set Nurse Communication: Mobility status;Precautions;Other (comment)(Pt's O2 sats during session) PT Visit Diagnosis: Unsteadiness on feet (R26.81);Other abnormalities of gait and mobility (R26.89);Muscle weakness (generalized) (M62.81);History of falling (Z91.81)    Time: 7902-4097 PT Time Calculation (min) (ACUTE ONLY): 41 min   Charges:   PT Evaluation $PT Eval Low Complexity: 1 Low PT Treatments $Therapeutic Activity: 23-37 mins       Leitha Bleak, PT 06/26/19, 12:46 PM

## 2019-06-26 NOTE — TOC Initial Note (Addendum)
Transition of Care [facility]) - Initial/Assessment Note    Patient Details  Name: Douglas Mathis MRN: [id number] Date of Birth: 1935/05/28  [facility] of [facility]) CM/SW Contact:    Liliana Cline, LCSW Phone Number: 06/26/2019, 1:37 PM  Clinical Narrative:           PT's current recommendation is SNF at discharge. CSW spoke with patient's daughter, Corrie Dandy, about this recommendation. Corrie Dandy reported patient was discharged from [facility] about a week ago and home health services had started. She said she thinks the [facility] was [facility]. Corrie Dandy reported she was in the process of working with patient's PCP to get aide services in the home. Corrie Dandy reported she is not sure if they want patient to go to a SNF as she feels he would rather go home with [facility] services if possible. She would like to see about adding Aide services to his [facility] services. Corrie Dandy reported she and her sister check on patient daily at home. Corrie Dandy reported she is going to talk with patient and her sister and let CSW know tomorrow morning if patient will agree to SNF or if they want him to go home with [facility].  CSW spoke with [facility] Representative Barbara Cower who reported patient is not active with them.        Spoke to [employer] and provided update. Asked Verlon Au which [facility] agency patient was set up with when he discharged from there. She reported she thinks it was [facility].   CSW attempted to call [facility] to follow-up on if they are active with patient and if they could add Aide services. CSW left a voicemail requesting a return call.   4:12: Attempted to reach [facility] again. Left another voicemail for [facility] requesting a return call.     Barriers to Discharge: Continued Medical Work up   Patient Goals and CMS Choice        Expected Discharge Plan and Services         Living arrangements for the past 2 months: Skilled Nursing Facility, Apartment                                       Prior Living Arrangements/Services Living arrangements for the past 2 months: Skilled Nursing Facility, Apartment Lives with:: Self   Do you feel safe going back to the place where you live?: Yes      Need for Family Participation in Patient Care: Yes (Comment) Care giver support system in place?: Yes (comment)   Criminal Activity/Legal Involvement Pertinent to Current Situation/Hospitalization: No - Comment as needed  Activities of Daily Living Home Assistive Devices/Equipment: Dan Humphreys (specify type) ADL Screening (condition at time of admission) Patient's cognitive ability adequate to safely complete daily activities?: No Is the patient deaf or have difficulty hearing?: No Does the patient have difficulty seeing, even when wearing glasses/contacts?: No Does the patient have difficulty concentrating, remembering, or making decisions?: No Patient able to express need for assistance with ADLs?: Yes Does the patient have difficulty dressing or bathing?: No Independently performs ADLs?: Yes (appropriate for developmental age) Does the patient have difficulty walking or climbing stairs?: Yes Weakness of Legs: Both Weakness of Arms/Hands: None  Permission Sought/Granted Permission sought to share information with : Photographer granted to share info w AGENCY: SNFs, [facility] agencies  Emotional Assessment         Alcohol / Substance Use: Not Applicable Psych Involvement: No (comment)  Admission diagnosis:  SOB (shortness of breath) [R06.02] COPD with acute bronchitis ([place]) [J44.0, J20.9] Patient Active Problem List   Diagnosis Date Noted  . COPD with acute bronchitis ([place]) 06/25/2019  . GERD (gastroesophageal reflux disease) 06/25/2019  . BPH (benign prostatic hyperplasia) 06/25/2019  . Hypotension   . Acute on chronic respiratory failure with hypercapnia ([place])   . Hyponatremia   . Fall   . Generalized  weakness   . Syncope   . Acute respiratory failure with hypoxia and hypercarbia ([place]) 06/03/2019  . Shock ([place]) 06/03/2019  . Chronic diastolic (congestive) heart failure ([place]) 03/31/2018  . HTN (hypertension) 03/31/2018  . Eyelid edema, left 03/31/2018  . COPD (chronic obstructive pulmonary disease) ([place]) 02/26/2018  . COPD exacerbation ([place]) 10/25/2017   PCP:  [facility] [place]:   [place], [place], [place] [address] [place] [address] Phone: [phone] Fax: [place], [place] - [address] [place] [address] Phone: [phone] Fax: [phone]     Social Determinants of Health (SDOH) Interventions    Readmission Risk Interventions No flowsheet data found.

## 2019-06-26 NOTE — Progress Notes (Addendum)
CSW spoke to [employer]. Verlon Au reported patient had recently discharged from [facility]. Verlon Au reported they should be able to accept him back if he needs SNF at discharge and would like to go back to [facility]. CSW will continue to follow.   Alfonso Ramus, [place] [phone]

## 2019-06-26 NOTE — Plan of Care (Signed)
  Problem: Education: Goal: Knowledge of General Education information will improve Description: Including pain rating scale, medication(s)/side effects and non-pharmacologic comfort measures Outcome: Progressing   Problem: Clinical Measurements: Goal: Ability to maintain clinical measurements within normal limits will improve Outcome: Progressing   Problem: Activity: Goal: Risk for activity intolerance will decrease Outcome: Progressing   Problem: Safety: Goal: Ability to remain free from injury will improve Outcome: Progressing   Problem: Education: Goal: Individualized Educational Video(s) Outcome: Progressing   Problem: Respiratory: Goal: Ability to maintain a clear airway will improve Outcome: Progressing Goal: Levels of oxygenation will improve Outcome: Progressing

## 2019-06-26 NOTE — Evaluation (Signed)
Occupational Therapy Evaluation Patient Details Name: Douglas Mathis MRN: [id number] DOB: Oct 03, 1935 Today's Date: 06/26/2019    History of Present Illness Pt is an 84 y.o. male presenting to hospital 5/2 with SOB and slightly increased productive cough.  Pt admitted with COPD with acute bronchitis.  PMH includes CHF, COPD on 2 L home O2, anemia, htn, BPH, syncope.   Clinical Impression   Douglas Mathis was seen for OT evaluation this date. Pt lives alone at [facility] and was MOD I using standard walker PTA. Pt presents to acute OT demonstrating impaired ADL performance and functional mobility 2/2 visual deficits, poor command following, and functional balance/endurance deficits. Pt currently requires MIN A self-drinking at bed level - pt required VCs to ID spills. MIN A + RW simulated BSC t/f. MIN A don/doff gown seated EOB. Pt would benefit from skilled OT to address noted impairments and functional limitations (see below for any additional details) in order to maximize safety and independence while minimizing falls risk and caregiver burden. Upon hospital discharge, recommend STR to maximize pt safety and return to PLOF.  Of note, pt performed poorly on visual assessment this date. Per conversation c PT and RN prior to session, pt appears to have functional visual deficits during mobility - requested OT assess vision. Pt has very limited peripheral vision, L worse than R. Pt consistently lost track of finger during H test x2 (sitting and standing) in all 4 quadrants L worse than R. Pt required increased time to relocated finger during H test and demonstrated poor insight into visual deficits.      Follow Up Recommendations  SNF    Equipment Recommendations  None recommended by OT    Recommendations for Other Services       Precautions / Restrictions Precautions Precautions: Fall Restrictions Weight Bearing Restrictions: No      Mobility Bed Mobility Overal bed mobility: Needs  Assistance Bed Mobility: Supine to Sit;Sit to Supine     Supine to sit: Supervision;HOB elevated Sit to supine: Supervision;HOB elevated      Transfers Overall transfer level: Needs assistance Equipment used: Rolling walker (2 wheeled) Transfers: Sit to/from Stand Sit to Stand: Min assist         General transfer comment: MIN A sit<>stand at bed and chair - assist for RW management. Pt required MAX VCs and RW management for safe t/f technique    Balance Overall balance assessment: Needs assistance;History of Falls Sitting-balance support: No upper extremity supported;Feet supported Sitting balance-Douglas Mathis Scale: Good     Standing balance support: Bilateral upper extremity supported Standing balance-Douglas Mathis Scale: Fair                             ADL either performed or assessed with clinical judgement   ADL Overall ADL's : Needs assistance/impaired                                       General ADL Comments: MIN A self-drinking at bed level - pt required cues to ID spills. SBA adjust socks seated EOB. MIN A + RW simulated BSC t/f. MIN A don/doff gown seated EOB     Vision   Vision Assessment?: Yes Tracking/Visual Pursuits: Decreased smoothness of eye movement to LEFT inferior field;Decreased smoothness of eye movement to RIGHT inferior field;Decreased smoothness of eye movement to LEFT superior field;Decreased smoothness  of eye movement to RIGHT superior field;Decreased smoothness of vertical tracking;Decreased smoothness of horizontal tracking Additional Comments: Pt has very limited peripheral vision, L worse than R). Pt consistently lost track of finger during H test in all 4 quadrants L worse than R - dincreased time to relocated finger     Perception     Praxis      Pertinent Vitals/Pain Pain Assessment: No/denies pain     Hand Dominance     Extremity/Trunk Assessment Upper Extremity Assessment Upper Extremity Assessment:  Generalized weakness   Lower Extremity Assessment Lower Extremity Assessment: Generalized weakness   Cervical / Trunk Assessment Cervical / Trunk Assessment: Normal   Communication Communication Communication: Other (comment)(Difficult to understand at times)   Cognition Arousal/Alertness: Awake/alert Behavior During Therapy: [facility] for tasks assessed/performed Overall Cognitive Status: No family/caregiver present to determine baseline cognitive functioning                                 General Comments: Pt oriented to place and self - states date as April 2024 Then corrects self to Monday May but unable to state year or reason for admission to hospital. Pt perseverative on daughter visiting and difficult to assess cognition    General Comments  SpO2 stable t/o on 4L [place]    Exercises Exercises: Other exercises Other Exercises Other Exercises: Pt educated re: falls prevention, energy conservation, and RW technique Other Exercises: Bed mobility, sit<>stand, sup<>sit, SPT, don/doff gown, sitting/standing balance/tolerance, self-drinking   Shoulder Instructions      Home Living Family/patient expects to be discharged to:: Private residence Living Arrangements: Alone([facility]) Available Help at Discharge: Family;Available PRN/intermittently Type of Home: Apartment Home Access: Elevator     Home Layout: One level     Bathroom Shower/Tub: Occupational psychologist: Standard     Home Equipment: Nurse, children's - 2 wheels;Bedside commode;Shower seat;Grab bars - tub/shower;Grab bars - toilet          Prior Functioning/Environment Level of Independence: Independent with assistive device(s);Needs assistance  Gait / Transfers Assistance Needed: Per pt he uses standard walker to ambulate; 2 falls in past 6 months d/t tripping ADL's / Homemaking Assistance Needed: Pt reports his daughter brings him his medications            OT Problem  List: Decreased strength;Decreased activity tolerance;Impaired balance (sitting and/or standing);Impaired vision/perception;Decreased safety awareness      OT Treatment/Interventions: Self-care/ADL training;Therapeutic exercise;Neuromuscular education;Energy conservation;DME and/or AE instruction;Visual/perceptual remediation/compensation;Therapeutic activities;Cognitive remediation/compensation;Patient/family education;Balance training    OT Goals(Current goals can be found in the care plan section) Acute Rehab OT Goals Patient Stated Goal: to go home OT Goal Formulation: With patient Time For Goal Achievement: 07/10/19 Potential to Achieve Goals: Good ADL Goals Pt Will Perform Grooming: sitting;Independently Pt Will Perform Lower Body Dressing: sit to/from stand;with supervision(c LRAD PRN) Pt Will Transfer to Toilet: with supervision;ambulating;regular height toilet(c LRAD PRN)  OT Frequency: Min 2X/week   Barriers to D/C: Decreased caregiver support          Co-evaluation              AM-PAC OT "6 Clicks" Daily Activity     Outcome Measure Help from another person eating meals?: A Little Help from another person taking care of personal grooming?: A Little Help from another person toileting, which includes using toliet, bedpan, or urinal?: A Little Help from another person bathing (including washing, rinsing,  drying)?: A Lot Help from another person to put on and taking off regular upper body clothing?: A Little Help from another person to put on and taking off regular lower body clothing?: A Lot 6 Click Score: 16   End of Session Equipment Utilized During Treatment: Rolling walker;Oxygen(4L [place]) Nurse Communication: Other (comment)(Condom catheder needs replacing)  Activity Tolerance: Patient tolerated treatment well Patient left: in bed;with call bell/phone within reach;with bed alarm set  OT Visit Diagnosis: Other abnormalities of gait and mobility  (R26.89);Unsteadiness on feet (R26.81)                Time: 7409-9278 OT Time Calculation (min): 19 min Charges:  OT General Charges $OT Visit: 1 Visit OT Evaluation $OT Eval Moderate Complexity: 1 Mod OT Treatments $Self Care/Home Management : 8-22 mins  Kathie Dike, M.S. OTR/L  06/26/19, 5:03 PM

## 2019-06-27 MED ORDER — POLYETHYLENE GLYCOL 3350 17 G PO PACK
17.0000 g | PACK | Freq: Every day | ORAL | Status: DC
  Administered 2019-06-27 – 2019-06-29 (×3): 17 g via ORAL
  Filled 2019-06-27 (×3): qty 1

## 2019-06-27 NOTE — NC FL2 (Signed)
[place] LEVEL OF CARE SCREENING TOOL     IDENTIFICATION  Patient Name: Douglas Mathis Birthdate: 1935/08/19 Sex: male Admission Date (Current Location): 06/25/2019  [place] and [place] Number:  Selena Lesser [id number] [place] and Address:  [facility], [address], [place], [place] [address]      Provider Number: [id number]  Attending Physician Name and Address:  Louellen Molder, MD  Relative Name and Phone Number:  Theodis Sato, daughter, [phone]    Current Level of Care: Hospital Recommended Level of Care: [place] Prior Approval Number:    Date Approved/Denied:   PASRR Number: [id number]  Discharge Plan:      Current Diagnoses: Patient Active Problem List   Diagnosis Date Noted  . COPD with acute bronchitis ([place]) 06/25/2019  . GERD (gastroesophageal reflux disease) 06/25/2019  . BPH (benign prostatic hyperplasia) 06/25/2019  . Hypotension   . Acute on chronic respiratory failure with hypercapnia ([place])   . Hyponatremia   . Fall   . Generalized weakness   . Syncope   . Acute respiratory failure with hypoxia and hypercarbia ([place]) 06/03/2019  . Shock ([place]) 06/03/2019  . Chronic diastolic (congestive) heart failure ([place]) 03/31/2018  . HTN (hypertension) 03/31/2018  . Eyelid edema, left 03/31/2018  . COPD (chronic obstructive pulmonary disease) ([place]) 02/26/2018  . COPD exacerbation ([place]) 10/25/2017    Orientation RESPIRATION BLADDER Height & Weight     Self, Time, Situation, Place  O2(4L) Incontinent, External catheter Weight: 155 lb 6.8 oz (70.5 kg) Height:  5\' 3"  (160 cm)  BEHAVIORAL SYMPTOMS/MOOD NEUROLOGICAL BOWEL NUTRITION STATUS      Continent Diet(Low sodium, thin liquids)  AMBULATORY STATUS COMMUNICATION OF NEEDS Skin   Limited Assist(RW) Verbally Bruising(R arm)                       Personal Care Assistance Level of Assistance  Bathing, Feeding, Dressing Bathing Assistance: Limited  assistance Feeding assistance: Independent Dressing Assistance: Limited assistance     Functional Limitations Info  Hearing, Speech, Sight Sight Info: Adequate Hearing Info: Adequate Speech Info: Adequate    SPECIAL CARE FACTORS FREQUENCY  OT (By licensed OT), PT (By licensed PT)     PT Frequency: 5x/wk OT Frequency: 5x/wk            Contractures Contractures Info: Not present    Additional Factors Info  Code Status, Allergies Code Status Info: DNR Allergies Info: NKA           Current Medications (06/27/2019):  This is the current hospital active medication list Current Facility-Administered Medications  Medication Dose Route Frequency Provider Last Rate Last Admin  . ascorbic acid (VITAMIN C) tablet 500 mg  500 mg Oral BID Agbata, Tochukwu, MD   500 mg at 06/27/19 0903  . carvedilol (COREG) tablet 6.25 mg  6.25 mg Oral BID Agbata, Tochukwu, MD   6.25 mg at 06/27/19 0902  . doxycycline (VIBRA-TABS) tablet 100 mg  100 mg Oral Q12H Agbata, Tochukwu, MD   100 mg at 06/27/19 1235  . enoxaparin (LOVENOX) injection 40 mg  40 mg Subcutaneous Q24H Agbata, Tochukwu, MD   40 mg at 06/26/19 1805  . erythromycin ophthalmic ointment   Both Eyes Q8H Agbata, Tochukwu, MD   1 application at 96/22/29 0600  . feeding supplement (ENSURE ENLIVE) (ENSURE ENLIVE) liquid 237 mL  237 mL Oral TID BM Agbata, Tochukwu, MD   237 mL at 06/27/19 0909  . ferrous sulfate tablet 325  mg  325 mg Oral BID WC Agbata, Tochukwu, MD   325 mg at 06/27/19 0903  . fluticasone furoate-vilanterol (BREO ELLIPTA) 200-25 MCG/INH 1 puff  1 puff Inhalation Daily Agbata, Tochukwu, MD   1 puff at 06/27/19 0901   And  . umeclidinium bromide (INCRUSE ELLIPTA) 62.5 MCG/INH 1 puff  1 puff Inhalation Daily Agbata, Tochukwu, MD   1 puff at 06/27/19 0901  . ipratropium-albuterol (DUONEB) 0.5-2.5 (3) MG/3ML nebulizer solution 3 mL  3 mL Nebulization Q6H Dhungel, Nishant, MD   3 mL at 06/27/19 0733  . MEDLINE mouth rinse  15 mL  Mouth Rinse BID Agbata, Tochukwu, MD   15 mL at 06/27/19 0903  . methylPREDNISolone sodium succinate (SOLU-MEDROL) 40 mg/mL injection 40 mg  40 mg Intravenous Q12H Dhungel, Nishant, MD   40 mg at 06/27/19 1234  . montelukast (SINGULAIR) tablet 10 mg  10 mg Oral Daily Agbata, Tochukwu, MD   10 mg at 06/27/19 0903  . multivitamin with minerals tablet 1 tablet  1 tablet Oral Daily Agbata, Tochukwu, MD   1 tablet at 06/27/19 0903  . nicotine (NICODERM CQ - dosed in mg/24 hours) patch 14 mg  14 mg Transdermal Daily Dhungel, Nishant, MD   14 mg at 06/27/19 0910  . pantoprazole (PROTONIX) EC tablet 40 mg  40 mg Oral Daily Agbata, Tochukwu, MD   40 mg at 06/27/19 0903  . pravastatin (PRAVACHOL) tablet 40 mg  40 mg Oral q1800 Agbata, Tochukwu, MD   40 mg at 06/26/19 1806  . tamsulosin (FLOMAX) capsule 0.4 mg  0.4 mg Oral QPC breakfast Agbata, Tochukwu, MD   0.4 mg at 06/27/19 2778     Discharge Medications: Please see discharge summary for a list of discharge medications.  Relevant Imaging Results:  Relevant Lab Results:   Additional Information SSNL [id number]  Jaida Basurto E Heena [place], LCSW

## 2019-06-27 NOTE — Progress Notes (Signed)
Physical Therapy Treatment Patient Details Name: Douglas Mathis MRN: [id number] DOB: 1935-11-02 Today's Date: 06/27/2019    History of Present Illness Pt is an 84 y.o. male presenting to hospital 5/2 with SOB and slightly increased productive cough.  Pt admitted with COPD with acute bronchitis.  PMH includes CHF, COPD on 2 L home O2, anemia, htn, BPH, syncope.    PT Comments    Pt initially hesitant to participate, however eventually agreeable to limited mobility. Educated on benefits of mobility to improve outcomes. Desats easily to 83% with exertion while on 3L of O2 taking a few minutes to recover. Performed 5TSTS with slow technique and heavy B UE. Good endurance with there-ex. Will continue to progress as able.  Follow Up Recommendations  SNF     Equipment Recommendations  Rolling walker with 5" wheels    Recommendations for Other Services       Precautions / Restrictions Precautions Precautions: Fall Restrictions Weight Bearing Restrictions: No    Mobility  Bed Mobility Overal bed mobility: Needs Assistance Bed Mobility: Supine to Sit;Sit to Supine     Supine to sit: Min guard     General bed mobility comments: increased time/effort. Once seated, needs cues for for upright posture  Transfers Overall transfer level: Needs assistance Equipment used: Rolling walker (2 wheeled) Transfers: Sit to/from Stand Sit to Stand: Min assist         General transfer comment: unable to stand initially without assist. Pushes from seated surface. ONce standing, upright posture  Ambulation/Gait Ambulation/Gait assistance: Min assist Gait Distance (Feet): 3 Feet Assistive device: Rolling walker (2 wheeled) Gait Pattern/deviations: Step-to pattern     General Gait Details: ambulated to recliner. Has difficulty positioning infront of recliner prior to sitting down needing VC for sequencing. Decreased eccentric control during transfer.   Stairs              Wheelchair Mobility    Modified Rankin (Stroke Patients Only)       Balance Overall balance assessment: Needs assistance;History of Falls Sitting-balance support: No upper extremity supported;Feet supported Sitting balance-Leahy Scale: Good     Standing balance support: Bilateral upper extremity supported Standing balance-Leahy Scale: Fair                              Cognition Arousal/Alertness: Awake/alert Behavior During Therapy: WFL for tasks assessed/performed Overall Cognitive Status: No family/caregiver present to determine baseline cognitive functioning                                 General Comments: slightly difficult to understand, soft spoken. Pt takes heavy encouragement to participate, however then reports he wasn't getting enough therapy time. Seemed confused to overall situation      Exercises Other Exercises Other Exercises: seated ther-ex performed on B LE including AP, SLRs, LAQ, alt marching, and hip abd/add. All ther-ex performed x 10 reps with supervision and safe technique. O2 sats monitored with sats decreasing to 83% with exertion. Takes increased time to recover Other Exercises: performed 5 time sit<>stand with supervision in 31 seconds. Uses B hands for assist    General Comments        Pertinent Vitals/Pain Pain Assessment: No/denies pain    Home Living                      Prior Function  PT Goals (current goals can now be found in the care plan section) Acute Rehab PT Goals Patient Stated Goal: to go home PT Goal Formulation: With patient Time For Goal Achievement: 07/10/19 Potential to Achieve Goals: Fair Progress towards PT goals: Progressing toward goals    Frequency    Min 2X/week      PT Plan Current plan remains appropriate    Co-evaluation              AM-PAC PT "6 Clicks" Mobility   Outcome Measure  Help needed turning from your back to your side while in  a flat bed without using bedrails?: A Little Help needed moving from lying on your back to sitting on the side of a flat bed without using bedrails?: A Little Help needed moving to and from a bed to a chair (including a wheelchair)?: A Little Help needed standing up from a chair using your arms (e.g., wheelchair or bedside chair)?: A Little Help needed to walk in hospital room?: A Little Help needed climbing 3-5 steps with a railing? : Total 6 Click Score: 16    End of Session Equipment Utilized During Treatment: Gait belt;Oxygen Activity Tolerance: Patient tolerated treatment well Patient left: in chair;with call bell/phone within reach;with chair alarm set Nurse Communication: Mobility status;Precautions;Other (comment) PT Visit Diagnosis: Unsteadiness on feet (R26.81);Other abnormalities of gait and mobility (R26.89);Muscle weakness (generalized) (M62.81);History of falling (Z91.81)     Time: 6010-9323 PT Time Calculation (min) (ACUTE ONLY): 23 min  Charges:  $Gait Training: 8-22 mins $Therapeutic Exercise: 8-22 mins                     Douglas Mathis, PT, DPT [phone]    Douglas Mathis 06/27/2019, 4:52 PM

## 2019-06-27 NOTE — TOC Progression Note (Signed)
Transition of Care [facility]) - Progression Note    Patient Details  Name: Douglas Mathis MRN: [id number] Date of Birth: [birth year]  [facility]) CM/SW Contact  Liliana Cline, LCSW Phone Number: 06/27/2019, 2:03 PM  Clinical Narrative:   CSW received update from [facility] Representative Brittney that patient is active with them for RN, PT, OT, and Aide services.   CSW received a call from patient's sister, Corrie Dandy, who reported patient is agreeable to SNF for rehab at discharge and would like to go back to [facility].   Updated TOC RNCM Misty who is covering patient today.      Barriers to Discharge: Continued Medical Work up  Expected Discharge Plan and Services         Living arrangements for the past 2 months: Skilled Nursing Facility, Apartment                                       Social Determinants of Health (SDOH) Interventions    Readmission Risk Interventions No flowsheet data found.

## 2019-06-27 NOTE — Progress Notes (Signed)
PROGRESS NOTE    Douglas Mathis  MLY:[id number] DOB: 06-Sep-1935 DOA: 06/25/2019 PCP: [facility] [place]   Chief Complaint  Patient presents with  . Shortness of Breath    Brief Narrative:  84 year old male with COPD with chronic hypoxic and hypercapnic respiratory failure on 2-3 L O2 via nasal cannula, diastolic CHF, ongoing tobacco use presented with increasing shortness of breath since the morning of admission requiring 4 L via nasal cannula.  Admitted for acute on chronic respiratory failure secondary to COPD exacerbation.  Assessment & Plan:   Principal Problem: COPD with acute exacerbation ([place]) Acute on chronic respiratory failure with hypoxia and hypercapnia (HCC) Ongoing tobacco use.  Associated dyspnea with productive cough.  Requiring 3 L via nasal cannula.  VBG shows chronic hypercapnia.  Still has bilateral rhonchi.  Continue IV Solu-Medrol for today and possibly transition to oral prednisone in a.m. if improving.  Continue scheduled nebs, antitussives and empiric doxycycline.    Active Problems:   Chronic diastolic (congestive) heart failure (HCC) Euvolemic.  Continue Coreg  Essential hypertension Amlodipine and lisinopril held for low diastolic blood pressure.  Continue Coreg.  BPH Continue Flomax  Tobacco abuse Counseled on cessation.  Nicotine patch.  Generalized weakness Seen by PT and recommend SNF.  Patient hesitant and wanted his daughter's approval.  Spoke with one of his daughters Theodis Sato) who agrees with him going to SNF.  DVT prophylaxis: Subcu Lovenox Code Status: DNR Family Communication: Spoke with daughter on the phone Disposition:   Status is: Inpatient  Remains inpatient appropriate because:IV treatments appropriate due to intensity of illness or inability to take PO.  Ongoing COPD exacerbation symptoms requiring IV steroid and scheduled nebs.  Unsafe for discharge at present and needs to be monitored as  inpatient.  Dispo: The patient is from: Home              Anticipated d/c is to: Home              Anticipated d/c date is: 2 days              Patient currently is not medically stable to d/c.        Consultants:   None   Procedures: None   Antimicrobials: Doxycycline   Subjective: Patient seen and examined.  Reports breathing slightly better from yesterday but still nowhere close to baseline.  Objective: Vitals:   06/26/19 2001 06/26/19 2328 06/27/19 0217 06/27/19 0804  BP:  (!) 113/43  (!) 125/47  Pulse:  70  62  Resp:  14  16  Temp:  98.6 F (37 C)  98 F (36.7 C)  TempSrc:  Oral  Oral  SpO2: 94% 99% 95% 96%  Weight:      Height:        Intake/Output Summary (Last 24 hours) at 06/27/2019 1249 Last data filed at 06/26/2019 2241 Gross per 24 hour  Intake 597 ml  Output 350 ml  Net 247 ml   Filed Weights   06/25/19 1259  Weight: 70.5 kg   Physical exam Elderly male not in distress HEENT: Moist mucosa Chest: Few scattered rhonchi bilaterally CVs: Normal S1-S2 GI: Soft, nondistended, nontender Musculoskeletal: Warm, no edema   Data Reviewed: I have personally reviewed following labs and imaging studies  CBC: Recent Labs  Lab 06/25/19 1255  WBC 14.6*  NEUTROABS 12.2*  HGB 8.5*  HCT 28.4*  MCV 91.3  PLT 812    Basic Metabolic Panel: Recent Labs  Lab 06/25/19 1255  NA 133*  K 4.7  CL 90*  CO2 34*  GLUCOSE 134*  BUN 19  CREATININE 0.64  CALCIUM 8.3*    GFR: Estimated Creatinine Clearance: 62.7 mL/min (by C-G formula based on SCr of 0.64 mg/dL).  Liver Function Tests: Recent Labs  Lab 06/25/19 1255  AST 13*  ALT 11  ALKPHOS 46  BILITOT 0.7  PROT 6.5  ALBUMIN 3.4*    CBG: No results for input(s): GLUCAP in the last 168 hours.   Recent Results (from the past 240 hour(s))  Respiratory Panel by RT PCR (Flu A&B, Covid) - Nasopharyngeal Swab     Status: None   Collection Time: 06/25/19  1:16 PM   Specimen: Nasopharyngeal  Swab  Result Value Ref Range Status   SARS Coronavirus 2 by RT PCR NEGATIVE NEGATIVE Final    Comment: (NOTE) SARS-CoV-2 target nucleic acids are NOT DETECTED. The SARS-CoV-2 RNA is generally detectable in upper respiratoy specimens during the acute phase of infection. The lowest concentration of SARS-CoV-2 viral copies this assay can detect is 131 copies/mL. A negative result does not preclude SARS-Cov-2 infection and should not be used as the sole basis for treatment or other patient management decisions. A negative result may occur with  improper specimen collection/handling, submission of specimen other than nasopharyngeal swab, presence of viral mutation(s) within the areas targeted by this assay, and inadequate number of viral copies (<131 copies/mL). A negative result must be combined with clinical observations, patient history, and epidemiological information. The expected result is Negative. Fact Sheet for Patients:  https://www.moore.com/ Fact Sheet for Healthcare Providers:  https://www.young.biz/ This test is not yet ap proved or cleared by the [place] FDA and  has been authorized for detection and/or diagnosis of SARS-CoV-2 by FDA under an Emergency Use Authorization (EUA). This EUA will remain  in effect (meaning this test can be used) for the duration of the COVID-19 declaration under Section 564(b)(1) of the Act, 21 U.S.C. section 360bbb-3(b)(1), unless the authorization is terminated or revoked sooner.    Influenza A by PCR NEGATIVE NEGATIVE Final   Influenza B by PCR NEGATIVE NEGATIVE Final    Comment: (NOTE) The Xpert Xpress SARS-CoV-2/FLU/RSV assay is intended as an aid in  the diagnosis of influenza from Nasopharyngeal swab specimens and  should not be used as a sole basis for treatment. Nasal washings and  aspirates are unacceptable for Xpert Xpress SARS-CoV-2/FLU/RSV  testing. Fact Sheet for  Patients: https://www.moore.com/ Fact Sheet for Healthcare Providers: https://www.young.biz/ This test is not yet approved or cleared by the [place] FDA and  has been authorized for detection and/or diagnosis of SARS-CoV-2 by  FDA under an Emergency Use Authorization (EUA). This EUA will remain  in effect (meaning this test can be used) for the duration of the  Covid-19 declaration under Section 564(b)(1) of the Act, 21  U.S.C. section 360bbb-3(b)(1), unless the authorization is  terminated or revoked. Performed at [facility], [address] [place], [place] [address]          Radiology Studies: CT HEAD WO CONTRAST  Result Date: 06/26/2019 CLINICAL DATA:  Transit ischemic attack.  Hypoxia. EXAM: CT HEAD WITHOUT CONTRAST TECHNIQUE: Contiguous axial images were obtained from the base of the skull through the vertex without intravenous contrast. COMPARISON:  06/03/2019 FINDINGS: Brain: No evidence of acute infarction, hemorrhage, hydrocephalus, extra-axial collection or mass lesion/mass effect. Atrophy and chronic microvascular ischemic changes are again noted. Vascular: No hyperdense vessel or unexpected calcification.  Skull: Normal. Negative for fracture or focal lesion. Sinuses/Orbits: No acute finding. Other: None. IMPRESSION: No acute intracranial process. Electronically Signed   By: Katherine Mantle M.D.   On: 06/26/2019 18:08   DG Chest Port 1 View  Result Date: 06/25/2019 CLINICAL DATA:  Shortness of breath beginning this morning. EXAM: PORTABLE CHEST 1 VIEW COMPARISON:  06/02/2019 and 04/01/2019 FINDINGS: Patient is slightly rotated to the left. Lungs are adequately inflated with minimal chronic streaky density left base. Mild increased prominence of the bronchovascular markings in the right infrahilar region compared to baseline suggesting possible acute bronchitic process. No effusion or lobar consolidation. Cardiomediastinal  silhouette and remainder the exam is unchanged. IMPRESSION: Chronic changes within the lung bases with possible slight worsening of right infrahilar bronchovascular markings suggesting possible acute bronchitic process. Electronically Signed   By: Elberta Fortis M.D.   On: 06/25/2019 13:58        Scheduled Meds: . ascorbic acid  500 mg Oral BID  . carvedilol  6.25 mg Oral BID  . doxycycline  100 mg Oral Q12H  . enoxaparin (LOVENOX) injection  40 mg Subcutaneous Q24H  . erythromycin   Both Eyes Q8H  . feeding supplement (ENSURE ENLIVE)  237 mL Oral TID BM  . ferrous sulfate  325 mg Oral BID WC  . fluticasone furoate-vilanterol  1 puff Inhalation Daily   And  . umeclidinium bromide  1 puff Inhalation Daily  . ipratropium-albuterol  3 mL Nebulization Q6H  . mouth rinse  15 mL Mouth Rinse BID  . methylPREDNISolone (SOLU-MEDROL) injection  40 mg Intravenous Q12H  . montelukast  10 mg Oral Daily  . multivitamin with minerals  1 tablet Oral Daily  . nicotine  14 mg Transdermal Daily  . pantoprazole  40 mg Oral Daily  . pravastatin  40 mg Oral q1800  . tamsulosin  0.4 mg Oral QPC breakfast   Continuous Infusions:   LOS: 2 days    Time spent:  35 minutes    Lino Wickliff, MD [facility]   To contact the attending provider between 7A-7P or the covering provider during after hours 7P-7A, please log into the web site [email or web address] and access using universal  password for that web site. If you do not have the password, please call the hospital operator.  06/27/2019, 12:49 PM

## 2019-06-28 DIAGNOSIS — E860 Dehydration: Secondary | ICD-10-CM

## 2019-06-28 DIAGNOSIS — K219 Gastro-esophageal reflux disease without esophagitis: Secondary | ICD-10-CM

## 2019-06-28 DIAGNOSIS — E871 Hypo-osmolality and hyponatremia: Secondary | ICD-10-CM

## 2019-06-28 LAB — CBC
HCT: 22.8 % — ABNORMAL LOW (ref 39.0–52.0)
Hemoglobin: 6.9 g/dL — ABNORMAL LOW (ref 13.0–17.0)
MCH: 27.1 pg (ref 26.0–34.0)
MCHC: 30.3 g/dL (ref 30.0–36.0)
MCV: 89.4 fL (ref 80.0–100.0)
Platelets: 272 10*3/uL (ref 150–400)
RBC: 2.55 MIL/uL — ABNORMAL LOW (ref 4.22–5.81)
RDW: 19.4 % — ABNORMAL HIGH (ref 11.5–15.5)
WBC: 12.2 10*3/uL — ABNORMAL HIGH (ref 4.0–10.5)
nRBC: 0.2 % (ref 0.0–0.2)

## 2019-06-28 LAB — BASIC METABOLIC PANEL
Anion gap: 8 (ref 5–15)
Anion gap: 10 (ref 5–15)
BUN: 33 mg/dL — ABNORMAL HIGH (ref 8–23)
BUN: 29 mg/dL — ABNORMAL HIGH (ref 8–23)
CO2: 37 mmol/L — ABNORMAL HIGH (ref 22–32)
CO2: 36 mmol/L — ABNORMAL HIGH (ref 22–32)
Calcium: 8.5 mg/dL — ABNORMAL LOW (ref 8.9–10.3)
Calcium: 8.5 mg/dL — ABNORMAL LOW (ref 8.9–10.3)
Chloride: 84 mmol/L — ABNORMAL LOW (ref 98–111)
Chloride: 82 mmol/L — ABNORMAL LOW (ref 98–111)
Creatinine, Ser: 0.69 mg/dL (ref 0.61–1.24)
Creatinine, Ser: 0.8 mg/dL (ref 0.61–1.24)
GFR calc Af Amer: >60 mL/min (ref >60)
GFR calc Af Amer: >60 mL/min (ref >60)
GFR calc non Af Amer: >60 mL/min (ref >60)
GFR calc non Af Amer: >60 mL/min (ref >60)
Glucose, Bld: 147 mg/dL — ABNORMAL HIGH (ref 70–99)
Glucose, Bld: 222 mg/dL — ABNORMAL HIGH (ref 70–99)
Potassium: 5 mmol/L (ref 3.5–5.1)
Potassium: 4.9 mmol/L (ref 3.5–5.1)
Sodium: 129 mmol/L — ABNORMAL LOW (ref 135–145)
Sodium: 128 mmol/L — ABNORMAL LOW (ref 135–145)

## 2019-06-28 LAB — HEMOGLOBIN AND HEMATOCRIT, BLOOD
HCT: 22.5 % — ABNORMAL LOW (ref 39.0–52.0)
Hemoglobin: 7.1 g/dL — ABNORMAL LOW (ref 13.0–17.0)

## 2019-06-28 MED ORDER — PREDNISONE 50 MG PO TABS
50.0000 mg | ORAL_TABLET | Freq: Every day | ORAL | Status: DC
  Administered 2019-06-29: 08:00:00 50 mg via ORAL
  Filled 2019-06-28: qty 1

## 2019-06-28 MED ORDER — SODIUM CHLORIDE 0.9 % IV SOLN: Freq: Once | INTRAVENOUS | Status: AC

## 2019-06-28 NOTE — Progress Notes (Signed)
[facility] [place] NAME: Douglas Mathis    MR#:  [id number]  DATE OF BIRTH:  October 22, 1935  SUBJECTIVE:   Denies any complaints. No new issues per RN. Currently on 3 L nasal cannula. No respiratory distress.  Patient uses chronic 3 L oxygen at home. Appears chronically deconditioned and weak REVIEW OF SYSTEMS:   Review of Systems  Constitutional: Positive for malaise/fatigue. Negative for chills, fever and weight loss.  HENT: Negative for ear discharge, ear pain and nosebleeds.   Eyes: Negative for blurred vision, pain and discharge.  Respiratory: Positive for shortness of breath. Negative for sputum production, wheezing and stridor.   Cardiovascular: Negative for chest pain, palpitations, orthopnea and PND.  Gastrointestinal: Negative for abdominal pain, diarrhea, nausea and vomiting.  Genitourinary: Negative for frequency and urgency.  Musculoskeletal: Negative for back pain and joint pain.  Neurological: Positive for weakness. Negative for sensory change, speech change and focal weakness.  Psychiatric/Behavioral: Negative for depression and hallucinations. The patient is not nervous/anxious.    Tolerating Diet:some Tolerating PT: yes  DRUG ALLERGIES:  No Known Allergies  VITALS:  Blood pressure 132/67, pulse 62, temperature 98.2 F (36.8 C), temperature source Oral, resp. rate 18, height 5\' 3"  (1.6 m), weight 70.5 kg, SpO2 91 %.  PHYSICAL EXAMINATION:   Physical Exam  GENERAL:  84 y.o.-year-old patient lying in the bed with no acute distress. chronically ill and weak  EYES: Pupils equal, round, reactive to light and accommodation. No scleral icterus.   HEENT: Head atraumatic, normocephalic. Oropharynx and nasopharynx clear.  NECK:  Supple, no jugular venous distention. No thyroid enlargement, no tenderness.  LUNGS: Normal breath sounds bilaterally, no wheezing, rales, rhonchi. No use of accessory muscles of respiration.   CARDIOVASCULAR: S1, S2 normal. No murmurs, rubs, or gallops.  ABDOMEN: Soft, nontender, nondistended. Bowel sounds present. No organomegaly or mass.  EXTREMITIES: No cyanosis, clubbing or edema b/l.    NEUROLOGIC: Cranial nerves II through XII are intact. No focal Motor or sensory deficits b/l.  weak PSYCHIATRIC:  patient is alert and awake  SKIN: No obvious rash, lesion, or ulcer.   LABORATORY PANEL:  CBC Recent Labs  Lab 06/28/19 0356 06/28/19 0356 06/28/19 0615  WBC 12.2*  --   --   HGB 6.9*   < > 7.1*  HCT 22.8*   < > 22.5*  PLT 272  --   --    < > = values in this interval not displayed.    Chemistries  Recent Labs  Lab 06/25/19 1255 06/28/19 0356 06/28/19 0940  NA 133*   < > 128*  K 4.7   < > 4.9  CL 90*   < > 82*  CO2 34*   < > 36*  GLUCOSE 134*   < > 222*  BUN 19   < > 29*  CREATININE 0.64   < > 0.80  CALCIUM 8.3*   < > 8.5*  AST 13*  --   --   ALT 11  --   --   ALKPHOS 46  --   --   BILITOT 0.7  --   --    < > = values in this interval not displayed.   Cardiac Enzymes No results for input(s): TROPONINI in the last 168 hours. RADIOLOGY:  CT HEAD WO CONTRAST  Result Date: 06/26/2019 CLINICAL DATA:  Transit ischemic attack.  Hypoxia. EXAM: CT HEAD WITHOUT CONTRAST TECHNIQUE: Contiguous axial images were obtained from the  base of the skull through the vertex without intravenous contrast. COMPARISON:  06/03/2019 FINDINGS: Brain: No evidence of acute infarction, hemorrhage, hydrocephalus, extra-axial collection or mass lesion/mass effect. Atrophy and chronic microvascular ischemic changes are again noted. Vascular: No hyperdense vessel or unexpected calcification. Skull: Normal. Negative for fracture or focal lesion. Sinuses/Orbits: No acute finding. Other: None. IMPRESSION: No acute intracranial process. Electronically Signed   By: Katherine Mantle M.D.   On: 06/26/2019 18:08   ASSESSMENT AND PLAN:  84 year old male with COPD with chronic hypoxic and  hypercapnic respiratory failure on 2-3 L O2 via nasal cannula, diastolic CHF, ongoing tobacco use presented with increasing shortness of breath since the morning of admission requiring 4 L via nasal cannula.  Admitted for acute on chronic respiratory failure secondary to COPD exacerbation.  Chronic respiratory failure with COPD with acute exacerbation (HCC) -Acute on chronic respiratory failure with hypoxia and hypercapnia (HCC) -Ongoing tobacco use.  Associated dyspnea with productive cough.   -Requiring 3 L via nasal cannula.   -VBG shows chronic hypercapnia.   currently no respiratory distress -received IV Solu-Medrol -- to oral steroid  - Continue scheduled nebs, antitussives and empiric doxycycline (day 4/5)   Hyponatremia and hypochloremia -suspected due poor po intake and meds, chronic respiratoy failure and ?SIADH -IVF -mentation at baseline -pt has had similar labs in the recent past   Chronic diastolic (congestive) heart failure (HCC) Euvolemic.   Continue Coreg -holding lasix due to hyponatremia  Essential hypertension -Amlodipine and lisinopril held for low diastolic blood pressure.  - Continue Coreg.  BPH Continue Flomax  Tobacco abuse Counseled on cessation.  Nicotine patch.  Generalized weakness Seen by PT and recommend SNF.    DVT prophylaxis: Subcu Lovenox Code Status: DNR Family Communication: Spoke with daughter on the phone Disposition:   Status is: Inpatient  Remains inpatient appropriate because:IV treatments appropriate due to intensity of illness or inability to take PO.  Ongoing COPD exacerbation . Has low sodium and Cl and need IVF today.  Unsafe for discharge at present and needs to be monitored as inpatient.  Dispo: The patient is from: Home  Anticipated d/c is to: SNF  Anticipated d/c date is: 1-2 days  Patient currently is not medically stable to d/c due to electrolyte abnormality with  hyponatremia and hypochloremia     TOTAL TIME TAKING CARE OF THIS PATIENT: *30 minutes.  >50% time spent on counselling and coordination of care  Note: This dictation was prepared with Dragon dictation along with smaller phrase technology. Any transcriptional errors that result from this process are unintentional.  Enedina Finner M.D    [facility]   CC: Primary care physician; [facility] HealthPatient ID: Douglas Mathis, male   DOB: [birth year], 84 y.o.   MRN: [id number]

## 2019-06-28 NOTE — TOC Progression Note (Addendum)
Transition of Care [facility]) - Progression Note    Patient Details  Name: TAIGE HOUSMAN MRN: [id number] Date of Birth: July 29, 1935  [facility] CM/SW Contact  Kadince Boxley, Lemar Livings, LCSW Phone Number: 06/28/2019, 8:26 AM  Clinical Narrative:   Have started insurance auth ref [id number] for [facility] to go back. Have contacted leslie-Adm and will await return call.  12:30 Have called [facility] to inform not medically ready until tomorrow. Mary-daughter aware also. Still awaiting insurance auth from [place] [facility]    Barriers to Discharge: Continued Medical Work up  Expected Discharge Plan and Services         Living arrangements for the past 2 months: Skilled Nursing Facility, Apartment                                       Social Determinants of Health (SDOH) Interventions    Readmission Risk Interventions No flowsheet data found.

## 2019-06-29 LAB — BASIC METABOLIC PANEL
Anion gap: 4 — ABNORMAL LOW (ref 5–15)
BUN: 28 mg/dL — ABNORMAL HIGH (ref 8–23)
CO2: 40 mmol/L — ABNORMAL HIGH (ref 22–32)
Calcium: 8.3 mg/dL — ABNORMAL LOW (ref 8.9–10.3)
Chloride: 86 mmol/L — ABNORMAL LOW (ref 98–111)
Creatinine, Ser: 0.67 mg/dL (ref 0.61–1.24)
GFR calc Af Amer: >60 mL/min (ref >60)
GFR calc non Af Amer: >60 mL/min (ref >60)
Glucose, Bld: 134 mg/dL — ABNORMAL HIGH (ref 70–99)
Potassium: 4.5 mmol/L (ref 3.5–5.1)
Sodium: 130 mmol/L — ABNORMAL LOW (ref 135–145)

## 2019-06-29 MED ORDER — IPRATROPIUM-ALBUTEROL 0.5-2.5 (3) MG/3ML IN SOLN
3.0000 mL | Freq: Three times a day (TID) | RESPIRATORY_TRACT | Status: DC
  Administered 2019-06-29: 3 mL via RESPIRATORY_TRACT
  Filled 2019-06-29 (×2): qty 3

## 2019-06-29 MED ORDER — DOXYCYCLINE HYCLATE 100 MG PO TABS: 100.0000 mg | ORAL_TABLET | Freq: Two times a day (BID) | ORAL | 0 refills | Status: AC

## 2019-06-29 MED ORDER — ENSURE ENLIVE PO LIQD: 237.0000 mL | Freq: Three times a day (TID) | ORAL | 12 refills | Status: AC

## 2019-06-29 MED ORDER — PRAVASTATIN SODIUM 40 MG PO TABS: 40.0000 mg | ORAL_TABLET | Freq: Every day | ORAL | 0 refills | Status: DC

## 2019-06-29 MED ORDER — ADULT MULTIVITAMIN W/MINERALS CH: 1.0000 | ORAL_TABLET | Freq: Every day | ORAL | Status: AC

## 2019-06-29 MED ORDER — FUROSEMIDE 20 MG PO TABS: 20.0000 mg | ORAL_TABLET | ORAL | 0 refills | Status: DC

## 2019-06-29 MED ORDER — FUROSEMIDE 20 MG PO TABS: 20.0000 mg | ORAL_TABLET | Freq: Every day | ORAL | 0 refills | Status: DC | PRN

## 2019-06-29 MED ORDER — PREDNISONE 10 MG PO TABS: ORAL_TABLET | ORAL | 0 refills | Status: DC

## 2019-06-29 NOTE — Discharge Instructions (Signed)
use your oxygen 3l/min use your inhalers and nebulizers as before

## 2019-06-29 NOTE — Care Management Important Message (Signed)
Important Message  Patient Details  Name: Douglas Mathis MRN: [id number] Date of Birth: 17-Aug-1935   Medicare Important Message Given:  Yes     Olegario Messier A [name] 06/29/2019, 9:45 AM

## 2019-06-29 NOTE — Discharge Summary (Signed)
[facility] - [place] at [facility]   PATIENT NAME: Douglas Mathis    MR#:  [id number]  DATE OF BIRTH:  March 02, 1935  DATE OF ADMISSION:  06/25/2019 ADMITTING PHYSICIAN: Lucile Shutters, MD  DATE OF DISCHARGE: 06/29/2019  PRIMARY CARE PHYSICIAN: [facility]    ADMISSION DIAGNOSIS:  SOB (shortness of breath) [R06.02] COPD with acute bronchitis (HCC) [J44.0, J20.9]  DISCHARGE DIAGNOSIS:  acute on chronic COPD exacerbation acute on chronic respiratory failure with hypoxia and chronic hypercapnia hyponatremia and hypokalemia-- chronic  SECONDARY DIAGNOSIS:   Past Medical History:  Diagnosis Date  . Anemia   . BPH (benign prostatic hyperplasia)   . CHF (congestive heart failure) (HCC)   . COPD (chronic obstructive pulmonary disease) (HCC)   . Hypercholesterolemia   . Hypertension     HOSPITAL COURSE:   84 year old male with COPD with chronic hypoxic and hypercapnic respiratory failure on 2-3 L O2 via nasal cannula, diastolic CHF, ongoing tobacco use presented with increasing shortness of breath since the morning of admission requiring 4 L via nasal cannula. Admitted for acute on chronic respiratory failure secondary to COPD exacerbation.  Chronic respiratory failure with COPD with acute exacerbation (HCC) -Acute on chronic respiratory failure with hypoxia and hypercapnia (HCC) -Ongoing tobacco use. Associated dyspnea with productive cough.  -Requiring 3 L via nasal cannula--chronic home oxygen -VBG shows chronic hypercapnia.  currently no respiratory distress -received IV Solu-Medrol -- to oral steroid taper -Continuescheduled nebs, antitussives and empiric doxycycline (day 5/7)   Hyponatremia and hypochloremia -suspected due poor po intake and meds, chronic respiratoy failure and ?SIADH -received IVF -mentation at baseline -pt has had similar labs in the recent past  Chronic diastolic (congestive) heart failure  (HCC) Euvolemic.  Continue Coreg -resume lasix 20 mg qod  Essential hypertension -Amlodipine and lisinopril held for low diastolic blood pressure.  -Continue Coreg.  BPH Continue Flomax  Tobacco abuse Counseled on cessation. Nicotine patch.  Generalized weakness Seen by PT and recommend SNF.   DVT prophylaxis:Subcu Lovenox Code Status:DNR Family Communication:Spoke with daughter [name] on the phone Disposition:  Status is: Inpatient  Dispo: The patient is from: Home Anticipated d/c is to: SNF Anticipated d/c date is: May 6th Patient currentlyis  medically stable to d/c--best at baseline  Pt is at a very HIGH risk for readmission   CONSULTS OBTAINED:    DRUG ALLERGIES:  No Known Allergies  DISCHARGE MEDICATIONS:   Allergies as of 06/29/2019   No Known Allergies     Medication List    STOP taking these medications   amLODipine 10 MG tablet Commonly known as: NORVASC   lisinopril 5 MG tablet Commonly known as: ZESTRIL     TAKE these medications   carvedilol 6.25 MG tablet Commonly known as: Coreg Take 1 tablet (6.25 mg total) by mouth 2 (two) times daily.   DOK 100 MG capsule Generic drug: docusate sodium Take 100 mg by mouth 2 (two) times daily.   doxycycline 100 MG tablet Commonly known as: VIBRA-TABS Take 1 tablet (100 mg total) by mouth every 12 (twelve) hours for 4 days.   feeding supplement (ENSURE ENLIVE) Liqd Take 237 mLs by mouth 3 (three) times daily between meals.   ferrous sulfate 325 (65 FE) MG tablet Take 1 tablet (325 mg total) by mouth 2 (two) times daily with a meal.   Fluticasone-Umeclidin-Vilant 100-62.5-25 MCG/INH Aepb Inhale 1 puff into the lungs daily.   furosemide 20 MG tablet Commonly known as: LASIX Take  1 tablet (20 mg total) by mouth every other day. For leg swelling What changed:  medication strength how much to take when to take this additional  instructions   GNP Vitamin C 500 MG tablet Generic drug: ascorbic acid Take 500 mg by mouth 2 (two) times daily.   ipratropium-albuterol 0.5-2.5 (3) MG/3ML Soln Commonly known as: DUONEB Take 3 mLs by nebulization every 6 (six) hours as needed.   montelukast 10 MG tablet Commonly known as: SINGULAIR Take 10 mg by mouth daily.   multivitamin with minerals Tabs tablet Take 1 tablet by mouth daily.   pantoprazole 40 MG tablet Commonly known as: PROTONIX Take 40 mg by mouth daily.   pravastatin 40 MG tablet Commonly known as: PRAVACHOL Take 1 tablet (40 mg total) by mouth daily at 6 PM.   predniSONE 10 MG tablet Commonly known as: DELTASONE Take 50 mg daily--taper by 10 mg daily then stop Start taking on: Jun 30, 2019   ProAir HFA 108 (90 Base) MCG/ACT inhaler Generic drug: albuterol Inhale 2 puffs into the lungs 4 (four) times daily as needed for wheezing or shortness of breath.   tamsulosin 0.4 MG Caps capsule Commonly known as: FLOMAX Take 0.4 mg by mouth daily after breakfast.       If you experience worsening of your admission symptoms, develop shortness of breath, life threatening emergency, suicidal or homicidal thoughts you must seek medical attention immediately by calling 911 or calling your MD immediately  if symptoms less severe.  You Must read complete instructions/literature along with all the possible adverse reactions/side effects for all the Medicines you take and that have been prescribed to you. Take any new Medicines after you have completely understood and accept all the possible adverse reactions/side effects.   Please note  You were cared for by a hospitalist during your hospital stay. If you have any questions about your discharge medications or the care you received while you were in the hospital after you are discharged, you can call the unit and asked to speak with the hospitalist on call if the hospitalist that took care of you is not available.  Once you are discharged, your primary care physician will handle any further medical issues. Please note that NO REFILLS for any discharge medications will be authorized once you are discharged, as it is imperative that you return to your primary care physician (or establish a relationship with a primary care physician if you do not have one) for your aftercare needs so that they can reassess your need for medications and monitor your lab values. Today   SUBJECTIVE   Feels ok. Coughing up some phelgm  VITAL SIGNS:  Blood pressure (!) 107/43, pulse 67, temperature 98.9 F (37.2 C), temperature source Oral, resp. rate 18, height 5\' 3"  (1.6 m), weight 70.5 kg, SpO2 100 %.  I/O:    Intake/Output Summary (Last 24 hours) at 06/29/2019 0818 Last data filed at 06/29/2019 0044 Gross per 24 hour  Intake 2420.32 ml  Output 675 ml  Net 1745.32 ml    PHYSICAL EXAMINATION:  GENERAL:  84 y.o.-year-old patient lying in the bed with no acute distress.  Appears chronically deconditioned EYES: Pupils equal, round, reactive to light and accommodation. No scleral icterus.  HEENT: Head atraumatic, normocephalic. Oropharynx and nasopharynx clear.  NECK:  Supple, no jugular venous distention. No thyroid enlargement, no tenderness.  LUNGS: Distant breath sounds bilaterally, no wheezing, rales,rhonchi or crepitation. No use of accessory muscles of respiration.  CARDIOVASCULAR: S1,  S2 normal. No murmurs, rubs, or gallops.  ABDOMEN: Soft, non-tender, non-distended. Bowel sounds present. No organomegaly or mass.  EXTREMITIES: No pedal edema, cyanosis, or clubbing.  NEUROLOGIC: Cranial nerves II through XII are intact. Muscle strength 4+/5 in all extremities. Sensation intact. Gait not checked.  PSYCHIATRIC: The patient is alert and oriented x 2.  SKIN: No obvious rash, lesion, or ulcer.   DATA REVIEW:   CBC  Recent Labs  Lab 06/28/19 0356 06/28/19 0356 06/28/19 0615  WBC 12.2*  --   --   HGB 6.9*   < >  7.1*  HCT 22.8*   < > 22.5*  PLT 272  --   --    < > = values in this interval not displayed.    Chemistries  Recent Labs  Lab 06/25/19 1255 06/28/19 0356 06/29/19 0416  NA 133*   < > 130*  K 4.7   < > 4.5  CL 90*   < > 86*  CO2 34*   < > 40*  GLUCOSE 134*   < > 134*  BUN 19   < > 28*  CREATININE 0.64   < > 0.67  CALCIUM 8.3*   < > 8.3*  AST 13*  --   --   ALT 11  --   --   ALKPHOS 46  --   --   BILITOT 0.7  --   --    < > = values in this interval not displayed.    Microbiology Results   Recent Results (from the past 240 hour(s))  Respiratory Panel by RT PCR (Flu A&B, Covid) - Nasopharyngeal Swab     Status: None   Collection Time: 06/25/19  1:16 PM   Specimen: Nasopharyngeal Swab  Result Value Ref Range Status   SARS Coronavirus 2 by RT PCR NEGATIVE NEGATIVE Final    Comment: (NOTE) SARS-CoV-2 target nucleic acids are NOT DETECTED. The SARS-CoV-2 RNA is generally detectable in upper respiratoy specimens during the acute phase of infection. The lowest concentration of SARS-CoV-2 viral copies this assay can detect is 131 copies/mL. A negative result does not preclude SARS-Cov-2 infection and should not be used as the sole basis for treatment or other patient management decisions. A negative result may occur with  improper specimen collection/handling, submission of specimen other than nasopharyngeal swab, presence of viral mutation(s) within the areas targeted by this assay, and inadequate number of viral copies (<131 copies/mL). A negative result must be combined with clinical observations, patient history, and epidemiological information. The expected result is Negative. Fact Sheet for Patients:  https://www.moore.com/ Fact Sheet for Healthcare Providers:  https://www.young.biz/ This test is not yet ap proved or cleared by the [place] FDA and  has been authorized for detection and/or diagnosis of SARS-CoV-2 by FDA  under an Emergency Use Authorization (EUA). This EUA will remain  in effect (meaning this test can be used) for the duration of the COVID-19 declaration under Section 564(b)(1) of the Act, 21 U.S.C. section 360bbb-3(b)(1), unless the authorization is terminated or revoked sooner.    Influenza A by PCR NEGATIVE NEGATIVE Final   Influenza B by PCR NEGATIVE NEGATIVE Final    Comment: (NOTE) The Xpert Xpress SARS-CoV-2/FLU/RSV assay is intended as an aid in  the diagnosis of influenza from Nasopharyngeal swab specimens and  should not be used as a sole basis for treatment. Nasal washings and  aspirates are unacceptable for Xpert Xpress SARS-CoV-2/FLU/RSV  testing. Fact Sheet for Patients: https://www.moore.com/ Fact Sheet for  Healthcare Providers: GravelBags.it This test is not yet approved or cleared by the [place] and  has been authorized for detection and/or diagnosis of SARS-CoV-2 by  FDA under an Emergency Use Authorization (EUA). This EUA will remain  in effect (meaning this test can be used) for the duration of the  Covid-19 declaration under Section 564(b)(1) of the Act, 21  U.S.C. section 360bbb-3(b)(1), unless the authorization is  terminated or revoked. Performed at [facility], [address] [place], [place] [address]     RADIOLOGY:  No results found.   CODE STATUS:     Code Status Orders  (From admission, onward)         Start     Ordered   06/25/19 1646  Do not attempt resuscitation (DNR)  Continuous    Question Answer Comment  In the event of cardiac or respiratory ARREST Do not call a "code blue"   In the event of cardiac or respiratory ARREST Do not perform Intubation, CPR, defibrillation or ACLS   In the event of cardiac or respiratory ARREST Use medication by any route, position, wound care, and other measures to relive pain and suffering. May use oxygen, suction and manual treatment of  airway obstruction as needed for comfort.   Comments Code status was discussed with patient's daughter over the phone and she wants him placed on a DNR status      06/25/19 1645        Code Status History    Date Active Date Inactive Code Status Order ID Comments User Context   06/25/2019 1609 06/25/2019 1645 Full Code [id number]  Collier Bullock, MD ED   06/03/2019 0440 06/07/2019 0108 DNR [id number]  Mansy, Arvella Merles, MD ED   03/19/2018 1200 03/22/2018 1614 DNR [id number]  Fritzi Mandes, MD Inpatient   02/27/2018 0706 03/01/2018 1607 Partial Code [id number]  Salary, Avel Peace, MD ED   10/25/2017 1225 10/27/2017 1929 DNR [id number]  Loletha Grayer, MD ED   07/08/2015 2030 07/11/2015 1358 DNR [id number]  Vaughan Basta, MD Inpatient   Advance Care Planning Activity       TOTAL TIME TAKING CARE OF THIS PATIENT: *40* minutes.    Fritzi Mandes M.D  [facility]    CC: Primary care physician; [facility] [place]

## 2019-06-29 NOTE — TOC Transition Note (Signed)
Transition of Care [facility]) - CM/SW Discharge Note   Patient Details  Name: Douglas Mathis MRN: [id number] Date of Birth: 18-Feb-1936  Transition of [facility]) CM/SW Contact:  Lucy Chris, LCSW Phone Number: 06/29/2019, 8:27 AM   Clinical Narrative:   Pt medically cleared to go to [facility], have insurance auth ref [id number] three days starting today with next update 5-8. Mannie Stabile to follow. [facility] aware and going to room 410. Have contacted daughter-mary to let her know of plan. Will go EMS and bedside RN to call report to [phone] once ready. DC packet in chart.    Final next level of care: Skilled Nursing Facility Barriers to Discharge: Barriers Resolved   Patient Goals and CMS Choice        Discharge Placement              Patient chooses bed at: [facility] Patient to be transferred to facility by: EMS Name of family member notified: Mary-daughter Patient and family notified of of transfer: 06/29/19  Discharge Plan and Services                                     Social Determinants of Health (SDOH) Interventions     Readmission Risk Interventions No flowsheet data found.

## 2019-07-03 ENCOUNTER — Telehealth: Payer: Self-pay | Admitting: Family

## 2019-07-03 NOTE — Telephone Encounter (Signed)
Confirmed patient follow up CHF Clinic appointment with nurse at [facility]. Nurse said patient is doing well with no complaints of symptoms from patient. He is getting some exercise but not much, following a low sodium diet, taking medications as he should and checks his weight daily.   Deetta Perla, [place]

## 2019-07-04 NOTE — Progress Notes (Signed)
Patient ID: Douglas Mathis, male    DOB: Apr 09, 1935, 84 y.o.   MRN: [id number]  HPI  Douglas Mathis is a 84 y/o male with a history of hyperlipidemia, HTN, COPD, anemia, current tobacco use and chronic heart failure.   Echo report from 06/04/19 reviewed and showed an EF of 60-65% without structural changes. Echo report from 10/25/17 reviewed and showed an EF of 60-65% along with moderate TR.   Admitted 06/25/19 due to COPD exacerbation. Given IV solu-medrol with transition to oral steroids. Given antibiotics and nebulizer treatment. Amlodipine and lisinopril held due to hypotension. Discharged after 4 days. Admitted 06/02/19 due to AMS and weakness. Head CT showed chronic changes. Initially needed bipap but then weaned down to nasal cannula. Antibiotics given for UTI. Carotid ultrasound did not show high-grade stenosis. Discharged after 4 days.    Douglas Mathis presents today for a follow-up visit although hasn't been seen if >1 year. Douglas Mathis presents with a chief complaint of minimal shortness of breath upon moderate exertion. Douglas Mathis describes this as chronic in nature having been present for several years. Douglas Mathis has associated fatigue and cough along with this. Douglas Mathis denies any difficulty sleeping, dizziness, abdominal distention, palpitations, pedal edema, chest pain or weight gain.   Says that Douglas Mathis's receiving daily PT at [facility] and that Douglas Mathis's getting weighed daily. Reports a good appetite and is not adding any salt to his food.  Past Medical History:  Diagnosis Date  . Anemia   . BPH (benign prostatic hyperplasia)   . CHF (congestive heart failure) (HCC)   . COPD (chronic obstructive pulmonary disease) (HCC)   . Hypercholesterolemia   . Hypertension    Past Surgical History:  Procedure Laterality Date  . cataracts    . ESOPHAGOGASTRODUODENOSCOPY N/A 07/10/2015   Procedure: ESOPHAGOGASTRODUODENOSCOPY (EGD);  Surgeon: Scot Jun, MD;  Location[facility] ENDOSCOPY;  Service: Endoscopy;  Laterality: N/A;  .  ESOPHAGOGASTRODUODENOSCOPY N/A 01/18/2018   Procedure: ESOPHAGOGASTRODUODENOSCOPY (EGD);  Surgeon: Toledo, Boykin Nearing, MD;  Location: [facility] ENDOSCOPY;  Service: Gastroenterology;  Laterality: N/A;   Family History  Problem Relation Age of Onset  . Diabetes Mother   . CAD Mother   . Bone cancer Brother   . Bronchitis Father    Social History   Tobacco Use  . Smoking status: Current Every Day Smoker    Packs/day: 0.50  . Smokeless tobacco: Never Used  Substance Use Topics  . Alcohol use: Not Currently    Comment: used to drink a bottle on the weekend with friends   No Known Allergies  Prior to Admission medications   Medication Sig Start Date End Date Taking? Authorizing Provider  carvedilol (COREG) 6.25 MG tablet Take 1 tablet (6.25 mg total) by mouth 2 (two) times daily. 03/22/18 07/05/19 Yes Pyreddy, Pavan, MD  DOK 100 MG capsule Take 100 mg by mouth 2 (two) times daily. 06/23/19  Yes [provider]  feeding supplement, ENSURE ENLIVE, (ENSURE ENLIVE) LIQD Take 237 mLs by mouth 3 (three) times daily between meals. 06/29/19  Yes Enedina Finner, MD  ferrous sulfate 325 (65 FE) MG tablet Take 1 tablet (325 mg total) by mouth 2 (two) times daily with a meal. 07/22/15  Yes Alford Highland, MD  Fluticasone-Umeclidin-Vilant 100-62.5-25 MCG/INH AEPB Inhale 1 puff into the lungs daily. 12/13/17  Yes [provider]  furosemide (LASIX) 20 MG tablet Take 1 tablet (20 mg total) by mouth every other day. For leg swelling 06/29/19  Yes Enedina Finner, MD  [facility] VITAMIN  C 500 MG tablet Take 500 mg by mouth 2 (two) times daily. 09/22/17  Yes [provider]  montelukast (SINGULAIR) 10 MG tablet Take 10 mg by mouth daily. 10/22/17  Yes [provider]  Multiple Vitamin (MULTIVITAMIN WITH MINERALS) TABS tablet Take 1 tablet by mouth daily. 06/29/19  Yes Fritzi Mandes, MD  pantoprazole (PROTONIX) 40 MG tablet Take 40 mg by mouth daily.  12/08/17  Yes [provider]  pravastatin  (PRAVACHOL) 40 MG tablet Take 1 tablet (40 mg total) by mouth daily at 6 PM. 06/29/19  Yes Fritzi Mandes, MD  [facility] HFA [phone] Base) MCG/ACT inhaler Inhale 2 puffs into the lungs 4 (four) times daily as needed for wheezing or shortness of breath.  08/04/17  Yes [provider]  tamsulosin (FLOMAX) 0.4 MG CAPS capsule Take 0.4 mg by mouth daily after breakfast.    Yes [provider]  ipratropium-albuterol (DUONEB) 0.5-2.5 (3) MG/3ML SOLN Take 3 mLs by nebulization every 6 (six) hours as needed. 06/23/19   [provider]  predniSONE (DELTASONE) 10 MG tablet Take 50 mg daily--taper by 10 mg daily then stop Patient not taking: Reported on 07/05/2019 06/30/19   Fritzi Mandes, MD    Review of Systems  Constitutional: Positive for fatigue (minimal). Negative for appetite change.  HENT: Negative for congestion, rhinorrhea and sore throat.   Eyes: Negative.   Respiratory: Positive for cough and shortness of breath (with moderate exertion).   Cardiovascular: Negative for chest pain, palpitations and leg swelling.  Gastrointestinal: Negative for abdominal distention and abdominal pain.  Endocrine: Negative.   Genitourinary: Negative.   Musculoskeletal: Negative for back pain and neck pain.  Skin: Negative.   Allergic/Immunologic: Negative.   Neurological: Negative for dizziness and light-headedness.  Hematological: Negative for adenopathy. Does not bruise/bleed easily.  Psychiatric/Behavioral: Negative for decreased concentration and sleep disturbance (sleeping on 2 pillows). The patient is not nervous/anxious.     Vitals:   07/05/19 0957  BP: (!) 145/44  Pulse: 72  Resp: 18  SpO2: 97%  Weight: 164 lb 4 oz (74.5 kg)  Height: 5\' 5"  (1.651 m)  PF: (!) 2 L/min   Wt Readings from Last 3 Encounters:  07/05/19 164 lb 4 oz (74.5 kg)  06/25/19 155 lb 6.8 oz (70.5 kg)  06/03/19 155 lb 3.3 oz (70.4 kg)   Lab Results  Component Value Date   CREATININE 0.67 06/29/2019    CREATININE 0.80 06/28/2019   CREATININE 0.69 06/28/2019     Physical Exam Vitals and nursing note reviewed.  Constitutional:      Appearance: Normal appearance.  HENT:     Head: Normocephalic and atraumatic.  Cardiovascular:     Rate and Rhythm: Normal rate and regular rhythm.  Pulmonary:     Effort: Pulmonary effort is normal. No respiratory distress.     Breath sounds: No wheezing or rales.  Abdominal:     General: There is no distension.     Palpations: Abdomen is soft.  Musculoskeletal:        General: No tenderness.     Cervical back: Normal range of motion and neck supple.     Right lower leg: No edema.     Left lower leg: No edema.  Skin:    General: Skin is warm and dry.  Neurological:     General: No focal deficit present.     Mental Status: Douglas Mathis is alert and oriented to person, place, and time.  Psychiatric:  Mood and Affect: Mood normal.        Behavior: Behavior normal.    Assessment & Plan:  1: Chronic heart failure with preserved ejection fraction without structural changes- - NYHA class II - euvolemic today - says Douglas Mathis's getting weighed daily; reminded to call for an overnight weight gain of >2 pounds or a weekly weight gain of >5 pounds - not adding salt to his food & says that his appetite is "great" - saw cardiology Juliann Pares) 05/09/2018 - BNP 06/25/19 was 228.0 - PharmD reconciled medications - says that Douglas Mathis's received both COVID vaccines - receiving daily PT  2: HTN-  - BP mildly elevated today (145/44) - follows with PCP at [facility] right now - BMP 06/29/19 reviewed and showed sodium 130, potassium 4.5, creatinine 0.67 and GFR >60  3: COPD- - saw pulmonology Meredeth Ide) 04/18/2018 - continued cessation discussed for 3 minutes with him - wearing oxygen at 2L at bedtime and as needed during the day (per patient's report)   Facility medication list was reviewed.  Return in 6 months or sooner for any questions/problems before then.

## 2019-07-05 ENCOUNTER — Other Ambulatory Visit: Payer: Self-pay

## 2019-07-05 ENCOUNTER — Encounter: Payer: Self-pay | Admitting: Family

## 2019-07-05 ENCOUNTER — Ambulatory Visit: Payer: Medicare Other | Attending: Family | Admitting: Family

## 2019-07-05 VITALS — BP 145/44 | HR 72 | Resp 18 | Ht 65.0 in | Wt 164.2 lb

## 2019-07-05 DIAGNOSIS — I10 Essential (primary) hypertension: Secondary | ICD-10-CM

## 2019-07-05 DIAGNOSIS — N400 Enlarged prostate without lower urinary tract symptoms: Secondary | ICD-10-CM | POA: Insufficient documentation

## 2019-07-05 DIAGNOSIS — E785 Hyperlipidemia, unspecified: Secondary | ICD-10-CM | POA: Insufficient documentation

## 2019-07-05 DIAGNOSIS — I5032 Chronic diastolic (congestive) heart failure: Secondary | ICD-10-CM | POA: Diagnosis present

## 2019-07-05 DIAGNOSIS — J449 Chronic obstructive pulmonary disease, unspecified: Secondary | ICD-10-CM | POA: Insufficient documentation

## 2019-07-05 DIAGNOSIS — Z8249 Family history of ischemic heart disease and other diseases of the circulatory system: Secondary | ICD-10-CM | POA: Diagnosis not present

## 2019-07-05 DIAGNOSIS — I110 Hypertensive heart disease with heart failure: Secondary | ICD-10-CM | POA: Insufficient documentation

## 2019-07-05 DIAGNOSIS — F17210 Nicotine dependence, cigarettes, uncomplicated: Secondary | ICD-10-CM | POA: Insufficient documentation

## 2019-07-05 DIAGNOSIS — Z79899 Other long term (current) drug therapy: Secondary | ICD-10-CM | POA: Diagnosis not present

## 2019-07-05 DIAGNOSIS — E7800 Pure hypercholesterolemia, unspecified: Secondary | ICD-10-CM | POA: Insufficient documentation

## 2019-07-05 DIAGNOSIS — Z7952 Long term (current) use of systemic steroids: Secondary | ICD-10-CM | POA: Diagnosis not present

## 2019-07-05 NOTE — Patient Instructions (Signed)
Continue weighing daily and call for an overnight weight gain of > 2 pounds or a weekly weight gain of >5 pounds. 

## 2019-07-05 NOTE — Progress Notes (Signed)
[facility] - PHARMACIST COUNSELING NOTE  ADHERENCE ASSESSMENT  Adherence strategy: Patient was discharged from hospital on 5/6 to [facility] where he is still a resident at time of this visit. He is not accompanied by anyone at today's visit. He endorses receiving his medications at the facility. He is not sure what medications he is taking.    Do you ever forget to take your medication? [] Yes (1) [x] No (0)  Do you ever skip doses due to side effects? [] Yes (1) [x] No (0)  Do you have trouble affording your medicines? [] Yes (1) [x] No (0)  Are you ever unable to pick up your medication due to transportation difficulties? [] Yes (1) [x] No (0)  Do you ever stop taking your medications because you don't believe they are helping? [] Yes (1) [x] No (0)  Total score 0   Recommendations given to patient about increasing adherence: None. Unclear how long he will reside at [facility]. Will re-address medication adherence at next visit.  Guideline-Directed Medical Therapy/Evidence Based Medicine  ACE/ARB/ARNI: None Beta Blocker: Carvedilol 6.25 mg BID Aldosterone Antagonist: None Diuretic: Furosemide 20 mg EOD    SUBJECTIVE  HPI: Patient is an 84 y/o M with PMH as below who presents to CHF clinic for follow-up. Patient was recently hospitalized 5/2-5/6 for COPD exacerbation , hyponatremia. He was further hospitalized 4/9-4/13 for acute on chronic respiratory failure , hyponatremia , hypotension. Seen in ED on 04/01/19 for SOB and discharged with course of prednisone and Augmentin.   Past Medical History:  Diagnosis Date  . Anemia   . BPH (benign prostatic hyperplasia)   . CHF (congestive heart failure) ([place])   . COPD (chronic obstructive pulmonary disease) ([place])   . Hypercholesterolemia   . Hypertension       OBJECTIVE   Vital signs: HR 72, BP 145/44, weight (pounds) 164 ECHO: Date 06/04/19, EF 60-65%, notes: Normal LV function, no regional wall  motion abnormalities, no LVH, diastolic parameters normal. RV normal. No significant valvular disease.  BMP Latest Ref Rng & Units 06/29/2019 06/28/2019 06/28/2019  Glucose 70 - 99 mg/dL 134(H) 222(H) 147(H)  BUN 8 - 23 mg/dL 28(H) 29(H) 33(H)  Creatinine 0.61 - 1.24 mg/dL 0.67 0.80 0.69  Sodium 135 - 145 mmol/L 130(L) 128(L) 129(L)  Potassium 3.5 - 5.1 mmol/L 4.5 4.9 5.0  Chloride 98 - 111 mmol/L 86(L) 82(L) 84(L)  CO2 22 - 32 mmol/L 40(H) 36(H) 37(H)  Calcium 8.9 - 10.3 mg/dL 8.3(L) 8.5(L) 8.5(L)    ASSESSMENT Patient is well appearing in no acute distress. He wears oxygen chronically. Reports breathing is stable. Denies edema. Denies signs/symptoms of hypotension , syncope , falls.  He is receiving medications at the facility. Denies any side effects of therapy.    PLAN  1). CHF -Appears euvolemic today -BNP 228 on 06/25/19 -ECHO normal on 06/04/19 -Continue furosemide 20 mg EOD for fluid management  2). Hypertension -Mild isolated systolic hypertension today -History of hypotension / syncope and amlodipine + lisinopril were discontinued earlier this month -He was worked up for concern of syncopal episodes during 05/2019 hospitalization; orthostatics were negative, ECHO normal, US carotid bilateral with bilateral bifurcation plaques without high-grade stenosis -Continue carvedilol 6.25 mg BID + furosemide 20 mg EOD  3). Hypercholesterolemia  -Pravastatin 40 mg QPM -06/02/19 CK 95 -Unable to find recent lipid panel in chart  4). COPD -Reports coughing up some clear sputum -Supplemental oxygen. Sats 97% today -Trelegy Ellipta 1 puff daily - endorses washing mouth out after  use -Montelukast 10 mg daily -Should be done with steroid taper  5). Anemia -Hgb 6.9 on 06/28/19 -Iron panel 06/04/19 with sats 7%, folate/B12 normal -Continue ferrous sulfate 325 mg BID  6). Chronic hyponatremia -Na 130, Cl 86 on 06/29/19 -Thought to be secondary to poor PO intake, meds, chronic respiratory  failure, possibly SIADH  7). BPH -Tamsulosin 0.4 mg daily - may contribute to hypotension    Time spent: 15 minutes  [place] Resident 07/05/2019 9:49 AM    Current Outpatient Medications:  .  carvedilol (COREG) 6.25 MG tablet, Take 1 tablet (6.25 mg total) by mouth 2 (two) times daily., Disp: 60 tablet, Rfl: 1 .  DOK 100 MG capsule, Take 100 mg by mouth 2 (two) times daily., Disp: , Rfl:  .  feeding supplement, ENSURE ENLIVE, (ENSURE ENLIVE) LIQD, Take 237 mLs by mouth 3 (three) times daily between meals., Disp: 237 mL, Rfl: 12 .  ferrous sulfate 325 (65 FE) MG tablet, Take 1 tablet (325 mg total) by mouth 2 (two) times daily with a meal., Disp: 60 tablet, Rfl: 0 .  Fluticasone-Umeclidin-Vilant 100-62.5-25 MCG/INH AEPB, Inhale 1 puff into the lungs daily., Disp: , Rfl:  .  furosemide (LASIX) 20 MG tablet, Take 1 tablet (20 mg total) by mouth every other day. For leg swelling, Disp: 10 tablet, Rfl: 0 .  GNP VITAMIN C 500 MG tablet, Take 500 mg by mouth 2 (two) times daily., Disp: , Rfl: 3 .  ipratropium-albuterol (DUONEB) 0.5-2.5 (3) MG/3ML SOLN, Take 3 mLs by nebulization every 6 (six) hours as needed., Disp: , Rfl:  .  montelukast (SINGULAIR) 10 MG tablet, Take 10 mg by mouth daily., Disp: , Rfl:  .  Multiple Vitamin (MULTIVITAMIN WITH MINERALS) TABS tablet, Take 1 tablet by mouth daily., Disp:  , Rfl:  .  pantoprazole (PROTONIX) 40 MG tablet, Take 40 mg by mouth daily. , Disp: , Rfl:  .  pravastatin (PRAVACHOL) 40 MG tablet, Take 1 tablet (40 mg total) by mouth daily at 6 PM., Disp: 30 tablet, Rfl: 0 .  predniSONE (DELTASONE) 10 MG tablet, Take 50 mg daily--taper by 10 mg daily then stop, Disp: 15 tablet, Rfl: 0 .  PROAIR HFA 108 (90 Base) MCG/ACT inhaler, Inhale 2 puffs into the lungs 4 (four) times daily as needed for wheezing or shortness of breath. , Disp: , Rfl: 3 .  tamsulosin (FLOMAX) 0.4 MG CAPS capsule, Take 0.4 mg by mouth daily after breakfast. , Disp: ,  Rfl:    COUNSELING POINTS/CLINICAL PEARLS  Carvedilol (Goal: weight less than 85 kg is 25 mg BID, weight greater than 85 kg is 50 mg BID)  Patient should avoid activities requiring coordination until drug effects are realized, as drug may cause dizziness.  This drug may cause diarrhea, nausea, vomiting, arthralgia, back pain, myalgia, headache, vision disorder, erectile dysfunction, reduced libido, or fatigue.  Instruct patient to report signs/symptoms of adverse cardiovascular effects such as hypotension (especially in elderly patients), arrhythmias, syncope, palpitations, angina, or edema.  Drug may mask symptoms of hypoglycemia. Advise diabetic patients to carefully monitor blood sugar levels.  Patient should take drug with food.  Advise patient against sudden discontinuation of drug. Furosemide  Drug causes sun-sensitivity. Advise patient to use sunscreen and avoid tanning beds. Patient should avoid activities requiring coordination until drug effects are realized, as drug may cause dizziness, vertigo, or blurred vision. This drug may cause hyperglycemia, hyperuricemia, constipation, diarrhea, loss of appetite, nausea, vomiting, purpuric disorder, cramps, spasticity,  asthenia, headache, paresthesia, or scaling eczema. Instruct patient to report unusual bleeding/bruising or signs/symptoms of hypotension, infection, pancreatitis, or ototoxicity (tinnitus, hearing impairment). Advise patient to report signs/symptoms of a severe skin reactions (flu-like symptoms, spreading red rash, or skin/mucous membrane blistering) or erythema multiforme. Instruct patient to eat high-potassium foods during drug therapy, as directed by healthcare professional.  Patient should not drink alcohol while taking this drug.  DRUGS TO AVOID IN HEART FAILURE  Drug or Class Mechanism  Analgesics . NSAIDs . COX-2 inhibitors . Glucocorticoids  Sodium and water retention, increased systemic vascular resistance,  decreased response to diuretics   Diabetes Medications . Metformin . Thiazolidinediones o Rosiglitazone (Avandia) o Pioglitazone (Actos) . DPP4 Inhibitors o Saxagliptin (Onglyza) o Sitagliptin (Januvia)   Lactic acidosis Possible calcium channel blockade   Unknown  Antiarrhythmics . Class I  o Flecainide o Disopyramide . Class III o Sotalol . Other o Dronedarone  Negative inotrope, proarrhythmic   Proarrhythmic, beta blockade  Negative inotrope  Antihypertensives . Alpha Blockers o Doxazosin . Calcium Channel Blockers o Diltiazem o Verapamil o Nifedipine . Central Alpha Adrenergics o Moxonidine . Peripheral Vasodilators o Minoxidil  Increases renin and aldosterone  Negative inotrope    Possible sympathetic withdrawal  Unknown  Anti-infective . Itraconazole . Amphotericin B  Negative inotrope Unknown  Hematologic . Anagrelide . Cilostazol   Possible inhibition of PD IV Inhibition of PD III causing arrhythmias  Neurologic/Psychiatric . Stimulants . Anti-Seizure Drugs o Carbamazepine o Pregabalin . Antidepressants o Tricyclics o Citalopram . Parkinsons o Bromocriptine o Pergolide o Pramipexole . Antipsychotics o Clozapine . Antimigraine o Ergotamine o Methysergide . Appetite suppressants . Bipolar o Lithium  Peripheral alpha and beta agonist activity  Negative inotrope and chronotrope Calcium channel blockade  Negative inotrope, proarrhythmic Dose-dependent QT prolongation  Excessive serotonin activity/valvular damage Excessive serotonin activity/valvular damage Unknown  IgE mediated hypersensitivy, calcium channel blockade  Excessive serotonin activity/valvular damage Excessive serotonin activity/valvular damage Valvular damage  Direct myofibrillar degeneration, adrenergic stimulation  Antimalarials . Chloroquine . Hydroxychloroquine Intracellular inhibition of lysosomal enzymes  Urologic Agents . Alpha  Blockers o Doxazosin o Prazosin o Tamsulosin o Terazosin  Increased renin and aldosterone  Adapted from Page RL, et al. "Drugs That May Cause or Exacerbate Heart Failure: A Scientific Statement from the [place]." Circulation 2016; 810:F75-Z02. DOI: 10.1161/CIR.0000000000000426   MEDICATION ADHERENCES TIPS AND STRATEGIES 1. Taking medication as prescribed improves patient outcomes in heart failure (reduces hospitalizations, improves symptoms, increases survival) 2. Side effects of medications can be managed by decreasing doses, switching agents, stopping drugs, or adding additional therapy. Please let someone in the [facility] know if you have having bothersome side effects so we can modify your regimen. Do not alter your medication regimen without talking to [place].  3. Medication reminders can help patients remember to take drugs on time. If you are missing or forgetting doses you can try linking behaviors, using pill boxes, or an electronic reminder like an alarm on your phone or an app. Some people can also get automated phone calls as medication reminders.

## 2019-10-06 ENCOUNTER — Encounter: Payer: Self-pay | Admitting: Emergency Medicine

## 2019-10-06 ENCOUNTER — Emergency Department: Payer: Medicare Other

## 2019-10-06 ENCOUNTER — Inpatient Hospital Stay
Admission: EM | Admit: 2019-10-06 | Discharge: 2019-10-16 | DRG: 640 | Disposition: A | Discharge status: Skilled Nursing Facility | Payer: Medicare Other | Attending: Internal Medicine | Admitting: Internal Medicine

## 2019-10-06 ENCOUNTER — Other Ambulatory Visit: Payer: Self-pay

## 2019-10-06 DIAGNOSIS — S22009A Unspecified fracture of unspecified thoracic vertebra, initial encounter for closed fracture: Secondary | ICD-10-CM

## 2019-10-06 DIAGNOSIS — N390 Urinary tract infection, site not specified: Secondary | ICD-10-CM | POA: Diagnosis present

## 2019-10-06 DIAGNOSIS — J209 Acute bronchitis, unspecified: Secondary | ICD-10-CM

## 2019-10-06 DIAGNOSIS — F17210 Nicotine dependence, cigarettes, uncomplicated: Secondary | ICD-10-CM | POA: Diagnosis present

## 2019-10-06 DIAGNOSIS — A419 Sepsis, unspecified organism: Secondary | ICD-10-CM | POA: Diagnosis present

## 2019-10-06 DIAGNOSIS — E871 Hypo-osmolality and hyponatremia: Principal | ICD-10-CM

## 2019-10-06 DIAGNOSIS — Z9981 Dependence on supplemental oxygen: Secondary | ICD-10-CM

## 2019-10-06 DIAGNOSIS — H109 Unspecified conjunctivitis: Secondary | ICD-10-CM | POA: Diagnosis not present

## 2019-10-06 DIAGNOSIS — I5032 Chronic diastolic (congestive) heart failure: Secondary | ICD-10-CM | POA: Diagnosis not present

## 2019-10-06 DIAGNOSIS — J9811 Atelectasis: Secondary | ICD-10-CM | POA: Diagnosis present

## 2019-10-06 DIAGNOSIS — R4182 Altered mental status, unspecified: Secondary | ICD-10-CM

## 2019-10-06 DIAGNOSIS — E875 Hyperkalemia: Secondary | ICD-10-CM | POA: Diagnosis not present

## 2019-10-06 DIAGNOSIS — R400 Somnolence: Secondary | ICD-10-CM | POA: Diagnosis not present

## 2019-10-06 DIAGNOSIS — G9341 Metabolic encephalopathy: Secondary | ICD-10-CM | POA: Diagnosis present

## 2019-10-06 DIAGNOSIS — I4510 Unspecified right bundle-branch block: Secondary | ICD-10-CM | POA: Diagnosis present

## 2019-10-06 DIAGNOSIS — I5033 Acute on chronic diastolic (congestive) heart failure: Secondary | ICD-10-CM | POA: Diagnosis not present

## 2019-10-06 DIAGNOSIS — Y92009 Unspecified place in unspecified non-institutional (private) residence as the place of occurrence of the external cause: Secondary | ICD-10-CM

## 2019-10-06 DIAGNOSIS — R296 Repeated falls: Secondary | ICD-10-CM | POA: Diagnosis present

## 2019-10-06 DIAGNOSIS — Z66 Do not resuscitate: Secondary | ICD-10-CM | POA: Diagnosis present

## 2019-10-06 DIAGNOSIS — Z9049 Acquired absence of other specified parts of digestive tract: Secondary | ICD-10-CM

## 2019-10-06 DIAGNOSIS — R1312 Dysphagia, oropharyngeal phase: Secondary | ICD-10-CM

## 2019-10-06 DIAGNOSIS — E7800 Pure hypercholesterolemia, unspecified: Secondary | ICD-10-CM | POA: Diagnosis present

## 2019-10-06 DIAGNOSIS — N400 Enlarged prostate without lower urinary tract symptoms: Secondary | ICD-10-CM | POA: Diagnosis present

## 2019-10-06 DIAGNOSIS — S22049A Unspecified fracture of fourth thoracic vertebra, initial encounter for closed fracture: Secondary | ICD-10-CM | POA: Diagnosis present

## 2019-10-06 DIAGNOSIS — J9621 Acute and chronic respiratory failure with hypoxia: Secondary | ICD-10-CM | POA: Diagnosis not present

## 2019-10-06 DIAGNOSIS — I110 Hypertensive heart disease with heart failure: Secondary | ICD-10-CM | POA: Diagnosis present

## 2019-10-06 DIAGNOSIS — S22040A Wedge compression fracture of fourth thoracic vertebra, initial encounter for closed fracture: Secondary | ICD-10-CM

## 2019-10-06 DIAGNOSIS — S22039A Unspecified fracture of third thoracic vertebra, initial encounter for closed fracture: Secondary | ICD-10-CM

## 2019-10-06 DIAGNOSIS — Z20822 Contact with and (suspected) exposure to covid-19: Secondary | ICD-10-CM | POA: Diagnosis present

## 2019-10-06 DIAGNOSIS — Z825 Family history of asthma and other chronic lower respiratory diseases: Secondary | ICD-10-CM

## 2019-10-06 DIAGNOSIS — I10 Essential (primary) hypertension: Secondary | ICD-10-CM | POA: Diagnosis present

## 2019-10-06 DIAGNOSIS — Z79899 Other long term (current) drug therapy: Secondary | ICD-10-CM

## 2019-10-06 DIAGNOSIS — W19XXXA Unspecified fall, initial encounter: Secondary | ICD-10-CM

## 2019-10-06 DIAGNOSIS — J449 Chronic obstructive pulmonary disease, unspecified: Secondary | ICD-10-CM | POA: Diagnosis present

## 2019-10-06 LAB — URINALYSIS, COMPLETE (UACMP) WITH MICROSCOPIC
Bilirubin Urine: NEGATIVE
Glucose, UA: NEGATIVE mg/dL
Hgb urine dipstick: NEGATIVE
Ketones, ur: NEGATIVE mg/dL
Nitrite: NEGATIVE
Protein, ur: NEGATIVE mg/dL
Specific Gravity, Urine: 1.011 (ref 1.005–1.030)
WBC, UA: >50 WBC/hpf — ABNORMAL HIGH (ref 0–5)
pH: 5 (ref 5.0–8.0)

## 2019-10-06 LAB — COMPREHENSIVE METABOLIC PANEL
ALT: 24 U/L (ref 0–44)
AST: 39 U/L (ref 15–41)
Albumin: 4.1 g/dL (ref 3.5–5.0)
Alkaline Phosphatase: 56 U/L (ref 38–126)
Anion gap: 12 (ref 5–15)
BUN: 20 mg/dL (ref 8–23)
CO2: 32 mmol/L (ref 22–32)
Calcium: 8.8 mg/dL — ABNORMAL LOW (ref 8.9–10.3)
Chloride: 79 mmol/L — ABNORMAL LOW (ref 98–111)
Creatinine, Ser: 0.67 mg/dL (ref 0.61–1.24)
GFR calc Af Amer: >60 mL/min (ref >60)
GFR calc non Af Amer: >60 mL/min (ref >60)
Glucose, Bld: 143 mg/dL — ABNORMAL HIGH (ref 70–99)
Potassium: 4.3 mmol/L (ref 3.5–5.1)
Sodium: 123 mmol/L — ABNORMAL LOW (ref 135–145)
Total Bilirubin: 0.8 mg/dL (ref 0.3–1.2)
Total Protein: 7.5 g/dL (ref 6.5–8.1)

## 2019-10-06 LAB — CBC
HCT: 27.5 % — ABNORMAL LOW (ref 39.0–52.0)
Hemoglobin: 8.6 g/dL — ABNORMAL LOW (ref 13.0–17.0)
MCH: 27 pg (ref 26.0–34.0)
MCHC: 31.3 g/dL (ref 30.0–36.0)
MCV: 86.2 fL (ref 80.0–100.0)
Platelets: 215 10*3/uL (ref 150–400)
RBC: 3.19 MIL/uL — ABNORMAL LOW (ref 4.22–5.81)
RDW: 16.7 % — ABNORMAL HIGH (ref 11.5–15.5)
WBC: 13.6 10*3/uL — ABNORMAL HIGH (ref 4.0–10.5)
nRBC: 0 % (ref 0.0–0.2)

## 2019-10-06 LAB — OSMOLALITY, URINE: Osmolality, Ur: 364 mOsm/kg (ref 300–900)

## 2019-10-06 LAB — BLOOD GAS, VENOUS
Acid-Base Excess: 8.6 mmol/L — ABNORMAL HIGH (ref 0.0–2.0)
Bicarbonate: 36.3 mmol/L — ABNORMAL HIGH (ref 20.0–28.0)
O2 Saturation: 84.4 %
Patient temperature: 37
pCO2, Ven: 72 mmHg (ref 44.0–60.0)
pH, Ven: 7.31 (ref 7.250–7.430)
pO2, Ven: 54 mmHg — ABNORMAL HIGH (ref 32.0–45.0)

## 2019-10-06 LAB — TSH: TSH: 0.944 u[IU]/mL (ref 0.350–4.500)

## 2019-10-06 LAB — ETHANOL: Alcohol, Ethyl (B): <10 mg/dL (ref <10)

## 2019-10-06 LAB — SARS CORONAVIRUS 2 BY RT PCR (HOSPITAL ORDER, PERFORMED IN ~~LOC~~ HOSPITAL LAB): SARS Coronavirus 2: NEGATIVE

## 2019-10-06 LAB — SODIUM, URINE, RANDOM: Sodium, Ur: 29 mmol/L

## 2019-10-06 LAB — TROPONIN I (HIGH SENSITIVITY)
Troponin I (High Sensitivity): 7 ng/L (ref <18)
Troponin I (High Sensitivity): 12 ng/L (ref <18)

## 2019-10-06 LAB — LACTIC ACID, PLASMA: Lactic Acid, Venous: 1 mmol/L (ref 0.5–1.9)

## 2019-10-06 LAB — OSMOLALITY: Osmolality: 265 mOsm/kg — ABNORMAL LOW (ref 275–295)

## 2019-10-06 LAB — MAGNESIUM: Magnesium: 1.9 mg/dL (ref 1.7–2.4)

## 2019-10-06 LAB — PROTIME-INR
INR: 1 (ref 0.8–1.2)
Prothrombin Time: 12.4 seconds (ref 11.4–15.2)

## 2019-10-06 LAB — SAMPLE TO BLOOD BANK

## 2019-10-06 LAB — BRAIN NATRIURETIC PEPTIDE: B Natriuretic Peptide: 302.2 pg/mL — ABNORMAL HIGH (ref 0.0–100.0)

## 2019-10-06 MED ORDER — SODIUM CHLORIDE 0.9 % IV SOLN
1.0000 g | INTRAVENOUS | Status: DC
  Administered 2019-10-07 – 2019-10-12 (×6): 1 g via INTRAVENOUS
  Filled 2019-10-06 (×3): qty 10
  Filled 2019-10-06: qty 1
  Filled 2019-10-06: qty 10
  Filled 2019-10-06: qty 1
  Filled 2019-10-06: qty 10

## 2019-10-06 MED ORDER — ENOXAPARIN SODIUM 40 MG/0.4ML ~~LOC~~ SOLN
40.0000 mg | SUBCUTANEOUS | Status: DC
  Administered 2019-10-07 – 2019-10-14 (×8): 40 mg via SUBCUTANEOUS
  Filled 2019-10-06 (×8): qty 0.4

## 2019-10-06 MED ORDER — IOHEXOL 300 MG/ML  SOLN
100.0000 mL | Freq: Once | INTRAMUSCULAR | Status: AC | PRN
  Administered 2019-10-06: 100 mL via INTRAVENOUS

## 2019-10-06 MED ORDER — SODIUM CHLORIDE 0.9 % IV SOLN
INTRAVENOUS | Status: DC
  Administered 2019-10-08: 75 mL via INTRAVENOUS

## 2019-10-06 MED ORDER — FUROSEMIDE 10 MG/ML IJ SOLN
40.0000 mg | Freq: Once | INTRAMUSCULAR | Status: AC
  Administered 2019-10-06: 40 mg via INTRAVENOUS
  Filled 2019-10-06: qty 4

## 2019-10-06 NOTE — ED Provider Notes (Signed)
[facility] Emergency Department Provider Note  ____________________________________________   First MD Initiated Contact with Patient 10/06/19 1934     (approximate)  I have reviewed the triage vital signs and the nursing notes.   HISTORY  Chief Complaint Fall   HPI Douglas Mathis is a 84 y.o. male with a past medical history of BPH, CHF, COPD on 2 L at baseline, HTN, HDL, anemia who presents for assessment via EMS from home for assessment after report of a fall this afternoon.  Patient reported lives at home alone.  Patient is unsure if she had LOC and is somewhat confused.  He does not recall exactly how he fell.  Per EMS he did require 4 L nasal cannula to maintain his SPO2 saturations.  Patient denies any pain.  Further history is limited from the patient is not oriented to place, recent events, year.     I was able to speak with the patient's daughter who stated the patient is typically alert and oriented and is on 3 L of nasal cannula baseline.  She states she is on this morning and he seemed okay but did not know who is the emergency room from a fall.    Past Medical History:  Diagnosis Date  . Anemia   . BPH (benign prostatic hyperplasia)   . CHF (congestive heart failure) (HCC)   . COPD (chronic obstructive pulmonary disease) (HCC)   . Hypercholesterolemia   . Hypertension     Patient Active Problem List   Diagnosis Date Noted  . Dehydration with hyponatremia   . COPD with acute bronchitis (HCC) 06/25/2019  . GERD (gastroesophageal reflux disease) 06/25/2019  . BPH (benign prostatic hyperplasia) 06/25/2019  . Hypotension   . Acute on chronic respiratory failure with hypercapnia (HCC)   . Hyponatremia   . Fall   . Generalized weakness   . Syncope   . Acute respiratory failure with hypoxia and hypercarbia (HCC) 06/03/2019  . Shock (HCC) 06/03/2019  . Chronic diastolic (congestive) heart failure (HCC) 03/31/2018  . HTN (hypertension)  03/31/2018  . Eyelid edema, left 03/31/2018  . COPD (chronic obstructive pulmonary disease) (HCC) 02/26/2018  . COPD exacerbation (HCC) 10/25/2017    Past Surgical History:  Procedure Laterality Date  . cataracts    . ESOPHAGOGASTRODUODENOSCOPY N/A 07/10/2015   Procedure: ESOPHAGOGASTRODUODENOSCOPY (EGD);  Surgeon: Scot Jun, MD;  Location: [facility] ENDOSCOPY;  Service: Endoscopy;  Laterality: N/A;  . ESOPHAGOGASTRODUODENOSCOPY N/A 01/18/2018   Procedure: ESOPHAGOGASTRODUODENOSCOPY (EGD);  Surgeon: Toledo, Boykin Nearing, MD;  Location: [facility] ENDOSCOPY;  Service: Gastroenterology;  Laterality: N/A;    Prior to Admission medications   Medication Sig Start Date End Date Taking? Authorizing Provider  amLODipine (NORVASC) 10 MG tablet Take 10 mg by mouth daily. 10/02/19  Yes [provider]  Ascorbic Acid (VITAMIN C) 500 MG CAPS Take 1 capsule by mouth 2 (two) times daily. 08/21/19  Yes [provider]  carvedilol (COREG) 6.25 MG tablet Take 1 tablet (6.25 mg total) by mouth 2 (two) times daily. 03/22/18 10/06/19 Yes Pyreddy, Pavan, MD  DOK 100 MG capsule Take 100 mg by mouth 2 (two) times daily. 06/23/19  Yes [provider]  ferrous sulfate 325 (65 FE) MG tablet Take 1 tablet (325 mg total) by mouth 2 (two) times daily with a meal. 07/22/15  Yes Alford Highland, MD  Fluticasone-Umeclidin-Vilant 100-62.5-25 MCG/INH AEPB Inhale 1 puff into the lungs daily. 12/13/17  Yes [provider]  furosemide (LASIX) 40 MG  tablet Take 40 mg by mouth 2 (two) times daily. 10/02/19  Yes [provider]  lisinopril (ZESTRIL) 5 MG tablet Take 5 mg by mouth daily. 08/31/19  Yes [provider]  montelukast (SINGULAIR) 10 MG tablet Take 10 mg by mouth daily. 10/22/17  Yes [provider]  Multiple Vitamin (MULTIVITAMIN WITH MINERALS) TABS tablet Take 1 tablet by mouth daily. 06/29/19  Yes Enedina Finner, MD  pantoprazole (PROTONIX) 40 MG tablet Take 40 mg by mouth  daily.  12/08/17  Yes [provider]  tamsulosin (FLOMAX) 0.4 MG CAPS capsule Take 0.4 mg by mouth daily after breakfast.    Yes [provider]  feeding supplement, ENSURE ENLIVE, (ENSURE ENLIVE) LIQD Take 237 mLs by mouth 3 (three) times daily between meals. 06/29/19   Enedina Finner, MD  ipratropium-albuterol (DUONEB) 0.5-2.5 (3) MG/3ML SOLN Take 3 mLs by nebulization every 6 (six) hours as needed. 06/23/19   [provider]  pravastatin (PRAVACHOL) 40 MG tablet Take 1 tablet (40 mg total) by mouth daily at 6 PM. Patient not taking: Reported on 10/06/2019 06/29/19   Enedina Finner, MD  predniSONE (DELTASONE) 10 MG tablet Take 50 mg daily--taper by 10 mg daily then stop Patient not taking: Reported on 07/05/2019 06/30/19   Enedina Finner, MD  [facility] HFA [id number] [phone] Base) MCG/ACT inhaler Inhale 2 puffs into the lungs 4 (four) times daily as needed for wheezing or shortness of breath.  08/04/17   [provider]    Allergies Patient has no known allergies.  Family History  Problem Relation Age of Onset  . Diabetes Mother   . CAD Mother   . Bone cancer Brother   . Bronchitis Father     Social History Social History   Tobacco Use  . Smoking status: Current Every Day Smoker    Packs/day: 0.50  . Smokeless tobacco: Never Used  Substance Use Topics  . Alcohol use: Not Currently    Comment: used to drink a bottle on the weekend with friends  . Drug use: Never    Review of Systems  Review of Systems  Unable to perform ROS: Mental status change      ____________________________________________   PHYSICAL EXAM:  VITAL SIGNS: ED Triage Vitals  Enc Vitals Group     BP 10/06/19 1931 139/61     Pulse Rate 10/06/19 1931 77     Resp 10/06/19 1931 19     Temp 10/06/19 1931 (!) 97.5 F (36.4 C)     Temp Source 10/06/19 1931 Oral     SpO2 10/06/19 1931 100 %     Weight 10/06/19 1933 166 lb (75.3 kg)     Height 10/06/19 1933 5\' 6"  (1.676 m)     Head Circumference  --      Peak Flow --      Pain Score 10/06/19 1933 0     Pain Loc --      Pain Edu? --      Excl. in GC? --    Vitals:   10/06/19 2100 10/06/19 2130  BP: (!) 115/40 (!) 112/54  Pulse: 67 63  Resp: 20 18  Temp:    SpO2: 100% 100%   Physical Exam Vitals and nursing note reviewed.  Constitutional:      General: He is not in acute distress. HENT:     Head: Normocephalic and atraumatic.     Right Ear: External ear normal.     Left Ear: External ear normal.  Nose: Nose normal.     Mouth/Throat:     Mouth: Mucous membranes are dry.  Eyes:     Conjunctiva/sclera: Conjunctivae normal.  Cardiovascular:     Rate and Rhythm: Normal rate and regular rhythm.     Pulses: Normal pulses.  Pulmonary:     Effort: Pulmonary effort is normal.  Abdominal:     General: There is no distension.     Tenderness: There is no abdominal tenderness. There is no right CVA tenderness or left CVA tenderness.  Musculoskeletal:     Cervical back: No rigidity.     Right lower leg: Edema present.     Left lower leg: Edema present.  Skin:    General: Skin is warm.     Capillary Refill: Capillary refill takes less than 2 seconds.  Neurological:     Mental Status: He is disoriented and confused.  Psychiatric:        Mood and Affect: Mood normal.     No tenderness over the C/T/L-spine.  2+ bilateral radial and DP pulses.  There is noted over the bilateral shoulders, elbows, wrists, hips, knees, or ankles.  There are no effusions, deformities, or other obvious signs of trauma to the extremities, chest, abdomen, or back.  Patient has a cataract in his left pupil he states he does not see out of his left eye.  EOMI.  Patient does have significant purulent drainage from both eyes. ____________________________________________   LABS (all labs ordered are listed, but only abnormal results are displayed)  Labs Reviewed  COMPREHENSIVE METABOLIC PANEL - Abnormal; Notable for the following components:       Result Value   Sodium 123 (*)    Chloride 79 (*)    Glucose, Bld 143 (*)    Calcium 8.8 (*)    All other components within normal limits  CBC - Abnormal; Notable for the following components:   WBC 13.6 (*)    RBC 3.19 (*)    Hemoglobin 8.6 (*)    HCT 27.5 (*)    RDW 16.7 (*)    All other components within normal limits  BRAIN NATRIURETIC PEPTIDE - Abnormal; Notable for the following components:   B Natriuretic Peptide 302.2 (*)    All other components within normal limits  BLOOD GAS, VENOUS - Abnormal; Notable for the following components:   pCO2, Ven 72 (*)    pO2, Ven 54.0 (*)    Bicarbonate 36.3 (*)    Acid-Base Excess 8.6 (*)    All other components within normal limits  URINALYSIS, COMPLETE (UACMP) WITH MICROSCOPIC - Abnormal; Notable for the following components:   Color, Urine YELLOW (*)    APPearance HAZY (*)    Leukocytes,Ua LARGE (*)    WBC, UA >50 (*)    Bacteria, UA MANY (*)    All other components within normal limits  SARS CORONAVIRUS 2 BY RT PCR (HOSPITAL ORDER, PERFORMED IN [facility])  ETHANOL  LACTIC ACID, PLASMA  PROTIME-INR  MAGNESIUM  TSH  OSMOLALITY, URINE  AMMONIA  OSMOLALITY  SODIUM, URINE, RANDOM  SAMPLE TO BLOOD BANK  TROPONIN I (HIGH SENSITIVITY)  TROPONIN I (HIGH SENSITIVITY)   ____________________________________________  EKG  Sinus rhythm with a ventricular rate of 73, right bundle branch block, Q waves in anterior and septal leads that are largely unchanged when compared to prior with a PVC noted on today's EKG as well.  Otherwise no clear evidence of acute ischemia or other significant underlying arrhythmia. ____________________________________________  RADIOLOGY   Official radiology report(s): CT HEAD WO CONTRAST  Result Date: 10/06/2019 CLINICAL DATA:  Fall EXAM: CT HEAD WITHOUT CONTRAST TECHNIQUE: Contiguous axial images were obtained from the base of the skull through the vertex without intravenous contrast.  COMPARISON:  06/26/2019 FINDINGS: Brain: No acute intracranial abnormality. Specifically, no hemorrhage, hydrocephalus, mass lesion, acute infarction, or significant intracranial injury. Vascular: No hyperdense vessel or unexpected calcification. Skull: No acute calvarial abnormality. Sinuses/Orbits: Visualized paranasal sinuses and mastoids clear. Orbital soft tissues unremarkable. Other: None IMPRESSION: No acute intracranial abnormality. Electronically Signed   By: Charlett Nose M.D.   On: 10/06/2019 22:13   CT CERVICAL SPINE WO CONTRAST  Result Date: 10/06/2019 CLINICAL DATA:  Neck pain EXAM: CT CERVICAL SPINE WITHOUT CONTRAST TECHNIQUE: Multidetector CT imaging of the cervical spine was performed without intravenous contrast. Multiplanar CT image reconstructions were also generated. COMPARISON:  06/10/2016 FINDINGS: Alignment: Normal Skull base and vertebrae: No acute fracture. No primary bone lesion or focal pathologic process. Soft tissues and spinal canal: No prevertebral fluid or swelling. No visible canal hematoma. Disc levels:  Diffuse degenerative disc disease and facet disease. Upper chest: No acute findings Other: None IMPRESSION: Degenerative disc and facet disease.  No acute bony abnormality. Electronically Signed   By: Charlett Nose M.D.   On: 10/06/2019 22:15   DG Pelvis Portable  Result Date: 10/06/2019 CLINICAL DATA:  Mechanical fall EXAM: PORTABLE PELVIS 1-2 VIEWS COMPARISON:  None. FINDINGS: There is no evidence of pelvic fracture or diastasis. No pelvic bone lesions are seen. Slightly limited evaluation of the right pubic rami due to positioning. There are vascular calcifications. IMPRESSION: No definite acute osseous abnormality Electronically Signed   By: Jasmine Pang M.D.   On: 10/06/2019 19:56   CT CHEST ABDOMEN PELVIS W CONTRAST  Result Date: 10/06/2019 CLINICAL DATA:  Fall change in mental status EXAM: CT CHEST, ABDOMEN, AND PELVIS WITH CONTRAST TECHNIQUE: Multidetector CT  imaging of the chest, abdomen and pelvis was performed following the standard protocol during bolus administration of intravenous contrast. CONTRAST:  OMNIPAQUE IOHEXOL 300 MG/ML  SOLN COMPARISON:  None. FINDINGS: Cardiovascular: There is mild cardiomegaly. Coronary artery calcifications are present. There is scattered aortic atherosclerosis noted. Calcifications are also seen within the mitral valve and aortic valve. No significant pericardial fluid/thickening. Great vessels are normal in course and caliber. No evidence of acute thoracic aortic injury. No central pulmonary emboli. Mediastinum/Nodes: No pneumomediastinum. No mediastinal hematoma. Unremarkable esophagus. No axillary, mediastinal or hilar lymphadenopathy. Lungs/Pleura:Again noted is areas of pleural thickening and calcifications at the left lung base. There is stable areas of bronchiectasis and scarring at the left lung base. No large airspace consolidation or pleural effusion are seen. There is centrilobular emphysematous changes seen at both lung apices with scarring. No pneumothorax. Musculoskeletal: There is a obliquely oriented fracture seen through the T3 spinous process and the right T3 inferior articulating facet. There is an acute superior compression fracture of the T4 vertebral body with slight buckling of the posterosuperior cortex. No retropulsion of fragments however is seen. Abdomen/pelvis: Hepatobiliary: Homogeneous hepatic attenuation without traumatic injury. No focal lesion. The patient is status post cholecystectomy. A small amount of pneumobilia is present which could be from prior intervention. For. Pancreas: No evidence for traumatic injury. Portions are partially obscured by adjacent bowel loops and paucity of intra-abdominal fat. No ductal dilatation or inflammation. Spleen: Homogeneous attenuation without traumatic injury. Normal in size. Adrenals/Urinary Tract: No adrenal hemorrhage. Kidneys demonstrate symmetric  enhancement and excretion on  delayed phase imaging. No evidence or renal injury. Ureters are well opacified proximal through mid portion. Bladder is physiologically distended without wall thickening. Stomach/Bowel: Suboptimally assessed without enteric contrast, allowing for this, no evidence of bowel injury. Stomach physiologically distended. There are no dilated or thickened small or large bowel loops. Moderate to large amount of colonic stool is present. Scattered colonic diverticula are noted. No evidence of mesenteric hematoma. No free air free fluid. Vascular/Lymphatic: No acute vascular injury. The abdominal aorta and IVC are intact. No evidence of retroperitoneal, abdominal, or pelvic adenopathy. Reproductive: No acute abnormality. Heterogeneously enlarged prostate gland is noted. Other: No focal contusion or abnormality of the abdominal wall. Musculoskeletal: No acute fracture of the lumbar spine or bony pelvis. IMPRESSION: Nondisplaced T3 spinous process and right inferior articulating facet fracture. Probable acute superior compression fracture of the T4 vertebral body with less than 25% loss in height. No other acute intrathoracic, abdominal, or pelvic injury. Aortic Atherosclerosis (ICD10-I70.0). Electronically Signed   By: Jonna Clark M.D.   On: 10/06/2019 22:21   DG Chest Port 1 View  Result Date: 10/06/2019 CLINICAL DATA:  84 year old male with fall. EXAM: PORTABLE CHEST 1 VIEW COMPARISON:  Chest radiograph dated 06/25/2019. FINDINGS: Background of emphysema. There is a small, likely chronic left pleural effusion and left pleural thickening along the left lung base. Right lung base reticulonodular densities appears similar to prior radiograph, and likely chronic. No new consolidation. There is no pneumothorax. Mild cardiomegaly. Atherosclerotic calcification of the aorta. No acute osseous pathology. IMPRESSION: 1. No acute cardiopulmonary process.  No interval change. 2. Emphysema and chronic  left pleural thickening. Electronically Signed   By: Elgie Collard M.D.   On: 10/06/2019 19:55    ____________________________________________   PROCEDURES  Procedure(s) performed (including Critical Care):  Procedures   ____________________________________________   INITIAL IMPRESSION / ASSESSMENT AND PLAN / ED COURSE        Patient presents with above-stated history exam for assessment after an unwitnessed fall.  Patient is afebrile and hemodynamically stable on arrival.  Patient is confused and unable to contribute significantly to history.  No evidence of significant injury on exam or focal deficits.  CT chest, C-spine, and head show evidence of spinous process fracture and a compression fracture but no evidence of intracranial bleeding or other visceral injury.  In addition patient does not appear to be intoxicated.  Patient is noted to be hyponatremic as noted above with an elevated BNP and appears slightly volume overloaded on exam with bilateral lower extremity edema.  Patient does not appear septic on exam has no evidence of fever or elevated white blood cell count I am low suspicion for acute infectious process at this time.  ECG and troponins are not consistent with ACS at this time.  He is noted to be very slightly hypercarbic but do not believe he warrants BiPAP at this time.  Will plan to admit to medicine service for further evaluation management.   ____________________________________________   FINAL CLINICAL IMPRESSION(S) / ED DIAGNOSES  Final diagnoses:  Fall  AMS (altered mental status)  Conjunctivitis of both eyes, unspecified conjunctivitis type  Closed fracture of transverse process of thoracic vertebra, initial encounter (HCC)  Compression fracture of T4 vertebra, initial encounter (HCC)  Hyponatremia     ED Discharge Orders    None       Note:  This document was prepared using Dragon voice recognition software and may include unintentional  dictation errors.   Gilles Chiquito, MD  10/06/19 2301  

## 2019-10-06 NOTE — ED Notes (Signed)
Daughter Merdis Delay states the patient's daughter [name] is the healthcare power of attorney for Mr. Tiso.  She can be called with any questions.  Additionally  Bernice's number is [phone] and another daughter Willette Brace [phone] can also be called.

## 2019-10-06 NOTE — H&P (Signed)
History and Physical    Douglas Mathis:[id number] DOB: [birth year] DOA: 10/06/2019  PCP: [facility]  Patient coming from: Home  I have personally briefly reviewed patient's old medical records in [facility] Link  Chief Complaint: Fall and altered mental status  HPI: Douglas Mathis is a 84 y.o. male with medical history significant for diastolic congestive heart failure, hypertension, COPD chronically on 3 L of O2 who presents for a fall and altered mental status.  Patient was unable to provide any history due to sodium mental status.  History was obtained through the daughter [name] who is his HCPOA.  She says that patient lives alone at a senior center and has neighbors that check on him.  Yesterday he sustained a fall in the bathroom and thinks that perhaps he tripped on his oxygen cord.  Then today he was found face down by EMS and they suspect that he fell because he tripped on his O2 cord.  Prior to that, daughter last saw him normal in the morning and states that he was still himself and making breakfast.  He does need to walk with a walker and is compliant with it.  She endorsed that he has been eating and drinking well since she buys his groceries.  He smokes cigarettes but does not drink any alcohol.  She says he was otherwise in his normal state of health and denies any signs of infection.  ED Course: He was afebrile, normotensive initially required up to 4 L via nasal cannula but was able to be turned back down to his home 3 L and remained at 99% oxygen saturation.  Lab work shows leukocytosis of 13.6 which appears to be chronic.  He also was hyponatremic down to 123 which he appears to have chronic hyponatremia around the 130s to high 120s.  Creatinine was normal at 0.67.  UA shows large leuk, negative nitrite and many WBC and bacteria.  CT head and CT cervical spine without any acute findings.  CT abdomen and pelvis showed nondisplaced T3 spinous  process and right inferior articular facet fracture.  There is a probable acute superior compression fracture of the T4 vertebral body.  Review of Systems: Unable to obtain given patient's altered mental status  Past Medical History:  Diagnosis Date  . Anemia   . BPH (benign prostatic hyperplasia)   . CHF (congestive heart failure) (HCC)   . COPD (chronic obstructive pulmonary disease) (HCC)   . Hypercholesterolemia   . Hypertension     Past Surgical History:  Procedure Laterality Date  . cataracts    . ESOPHAGOGASTRODUODENOSCOPY N/A 07/10/2015   Procedure: ESOPHAGOGASTRODUODENOSCOPY (EGD);  Surgeon: Scot Jun, MD;  Location: [facility] ENDOSCOPY;  Service: Endoscopy;  Laterality: N/A;  . ESOPHAGOGASTRODUODENOSCOPY N/A 01/18/2018   Procedure: ESOPHAGOGASTRODUODENOSCOPY (EGD);  Surgeon: Toledo, Boykin Nearing, MD;  Location: [facility] ENDOSCOPY;  Service: Gastroenterology;  Laterality: N/A;     reports that he has been smoking. He has been smoking about 0.50 packs per day. He has never used smokeless tobacco. He reports previous alcohol use. He reports that he does not use drugs. Social History  No Known Allergies  Family History  Problem Relation Age of Onset  . Diabetes Mother   . CAD Mother   . Bone cancer Brother   . Bronchitis Father      Prior to Admission medications   Medication Sig Start Date End Date Taking? Authorizing Provider  amLODipine (NORVASC) 10 MG tablet Take  10 mg by mouth daily. 10/02/19  Yes [provider]  Ascorbic Acid (VITAMIN C) 500 MG CAPS Take 1 capsule by mouth 2 (two) times daily. 08/21/19  Yes [provider]  carvedilol (COREG) 6.25 MG tablet Take 1 tablet (6.25 mg total) by mouth 2 (two) times daily. 03/22/18 10/06/19 Yes Pyreddy, Pavan, MD  DOK 100 MG capsule Take 100 mg by mouth 2 (two) times daily. 06/23/19  Yes [provider]  ferrous sulfate 325 (65 FE) MG tablet Take 1 tablet (325 mg total) by mouth 2 (two) times daily  with a meal. 07/22/15  Yes Alford Highland, MD  Fluticasone-Umeclidin-Vilant 100-62.5-25 MCG/INH AEPB Inhale 1 puff into the lungs daily. 12/13/17  Yes [provider]  furosemide (LASIX) 40 MG tablet Take 40 mg by mouth 2 (two) times daily. 10/02/19  Yes [provider]  lisinopril (ZESTRIL) 5 MG tablet Take 5 mg by mouth daily. 08/31/19  Yes [provider]  montelukast (SINGULAIR) 10 MG tablet Take 10 mg by mouth daily. 10/22/17  Yes [provider]  Multiple Vitamin (MULTIVITAMIN WITH MINERALS) TABS tablet Take 1 tablet by mouth daily. 06/29/19  Yes Enedina Finner, MD  pantoprazole (PROTONIX) 40 MG tablet Take 40 mg by mouth daily.  12/08/17  Yes [provider]  tamsulosin (FLOMAX) 0.4 MG CAPS capsule Take 0.4 mg by mouth daily after breakfast.    Yes [provider]  feeding supplement, ENSURE ENLIVE, (ENSURE ENLIVE) LIQD Take 237 mLs by mouth 3 (three) times daily between meals. 06/29/19   Enedina Finner, MD  ipratropium-albuterol (DUONEB) 0.5-2.5 (3) MG/3ML SOLN Take 3 mLs by nebulization every 6 (six) hours as needed. 06/23/19   [provider]  pravastatin (PRAVACHOL) 40 MG tablet Take 1 tablet (40 mg total) by mouth daily at 6 PM. Patient not taking: Reported on 10/06/2019 06/29/19   Enedina Finner, MD  predniSONE (DELTASONE) 10 MG tablet Take 50 mg daily--taper by 10 mg daily then stop Patient not taking: Reported on 07/05/2019 06/30/19   Enedina Finner, MD  [facility] HFA [id number] [phone] Base) MCG/ACT inhaler Inhale 2 puffs into the lungs 4 (four) times daily as needed for wheezing or shortness of breath.  08/04/17   [provider]    Physical Exam: Vitals:   10/06/19 1931 10/06/19 1933 10/06/19 2100 10/06/19 2130  BP: 139/61  (!) 115/40 (!) 112/54  Pulse: 77  67 63  Resp: 19  20 18   Temp: (!) 97.5 F (36.4 C)     TempSrc: Oral     SpO2: 100%  100% 100%  Weight:  75.3 kg    Height:  5\' 6"  (1.676 m)      Constitutional: NAD, calm,  comfortable, elderly gentleman laying flat in bed asleep.  Patient awoke easily to voice but otherwise could not carry conversation and would just moan. Vitals:   10/06/19 1931 10/06/19 1933 10/06/19 2100 10/06/19 2130  BP: 139/61  (!) 115/40 (!) 112/54  Pulse: 77  67 63  Resp: 19  20 18   Temp: (!) 97.5 F (36.4 C)     TempSrc: Oral     SpO2: 100%  100% 100%  Weight:  75.3 kg    Height:  5\' 6"  (1.676 m)     Eyes: PERRL, crusting of bilateral lid and mild erythematous conjunctiva ENMT: Mucous membranes are moist.  Neck: normal, supple, Respiratory: Anterior wheezing but no crackles. Normal respiratory effort on 3 L. No accessory muscle use.  Cardiovascular: Regular rate and  rhythm, no murmurs / rubs / gallops. No extremity edema.   Abdomen: no tenderness, no masses palpated. . Bowel sounds positive.  Musculoskeletal: no clubbing / cyanosis. No joint deformity upper and lower extremities.Normal muscle tone.  Skin: no rashes, lesions, ulcers. No induration Neurologic: Patient appears lethargic and drowsy.  He awoke easily to voice but would fall back asleep.  He was not able to follow commands.  When asked questions he would only moan.   Psychiatric: Unable to assess due to limitations   Labs on Admission: I have personally reviewed following labs and imaging studies  CBC: Recent Labs  Lab 10/06/19 1939  WBC 13.6*  HGB 8.6*  HCT 27.5*  MCV 86.2  PLT 215   Basic Metabolic Panel: Recent Labs  Lab 10/06/19 1939  NA 123*  K 4.3  CL 79*  CO2 32  GLUCOSE 143*  BUN 20  CREATININE 0.67  CALCIUM 8.8*  MG 1.9   GFR: Estimated Creatinine Clearance: 64.2 mL/min (by C-G formula based on SCr of 0.67 mg/dL). Liver Function Tests: Recent Labs  Lab 10/06/19 1939  AST 39  ALT 24  ALKPHOS 56  BILITOT 0.8  PROT 7.5  ALBUMIN 4.1   No results for input(s): LIPASE, AMYLASE in the last 168 hours. No results for input(s): AMMONIA in the last 168 hours. Coagulation  Profile: Recent Labs  Lab 10/06/19 1939  INR 1.0   Cardiac Enzymes: No results for input(s): CKTOTAL, CKMB, CKMBINDEX, TROPONINI in the last 168 hours. BNP (last 3 results) No results for input(s): PROBNP in the last 8760 hours. HbA1C: No results for input(s): HGBA1C in the last 72 hours. CBG: No results for input(s): GLUCAP in the last 168 hours. Lipid Profile: No results for input(s): CHOL, HDL, LDLCALC, TRIG, CHOLHDL, LDLDIRECT in the last 72 hours. Thyroid Function Tests: Recent Labs    10/06/19 1939  TSH 0.944   Anemia Panel: No results for input(s): VITAMINB12, FOLATE, FERRITIN, TIBC, IRON, RETICCTPCT in the last 72 hours. Urine analysis:    Component Value Date/Time   COLORURINE YELLOW (A) 10/06/2019 2216   APPEARANCEUR HAZY (A) 10/06/2019 2216   APPEARANCEUR Cloudy 10/05/2013 0414   LABSPEC 1.011 10/06/2019 2216   LABSPEC 1.017 10/05/2013 0414   PHURINE 5.0 10/06/2019 2216   GLUCOSEU NEGATIVE 10/06/2019 2216   GLUCOSEU Negative 10/05/2013 0414   HGBUR NEGATIVE 10/06/2019 2216   BILIRUBINUR NEGATIVE 10/06/2019 2216   BILIRUBINUR Negative 10/05/2013 0414   KETONESUR NEGATIVE 10/06/2019 2216   PROTEINUR NEGATIVE 10/06/2019 2216   NITRITE NEGATIVE 10/06/2019 2216   LEUKOCYTESUR LARGE (A) 10/06/2019 2216   LEUKOCYTESUR 3+ 10/05/2013 0414    Radiological Exams on Admission: CT HEAD WO CONTRAST  Result Date: 10/06/2019 CLINICAL DATA:  Fall EXAM: CT HEAD WITHOUT CONTRAST TECHNIQUE: Contiguous axial images were obtained from the base of the skull through the vertex without intravenous contrast. COMPARISON:  06/26/2019 FINDINGS: Brain: No acute intracranial abnormality. Specifically, no hemorrhage, hydrocephalus, mass lesion, acute infarction, or significant intracranial injury. Vascular: No hyperdense vessel or unexpected calcification. Skull: No acute calvarial abnormality. Sinuses/Orbits: Visualized paranasal sinuses and mastoids clear. Orbital soft tissues  unremarkable. Other: None IMPRESSION: No acute intracranial abnormality. Electronically Signed   By: Charlett Nose M.D.   On: 10/06/2019 22:13   CT CERVICAL SPINE WO CONTRAST  Result Date: 10/06/2019 CLINICAL DATA:  Neck pain EXAM: CT CERVICAL SPINE WITHOUT CONTRAST TECHNIQUE: Multidetector CT imaging of the cervical spine was performed without intravenous contrast. Multiplanar CT image reconstructions were also generated.  COMPARISON:  06/10/2016 FINDINGS: Alignment: Normal Skull base and vertebrae: No acute fracture. No primary bone lesion or focal pathologic process. Soft tissues and spinal canal: No prevertebral fluid or swelling. No visible canal hematoma. Disc levels:  Diffuse degenerative disc disease and facet disease. Upper chest: No acute findings Other: None IMPRESSION: Degenerative disc and facet disease.  No acute bony abnormality. Electronically Signed   By: Charlett Nose M.D.   On: 10/06/2019 22:15   DG Pelvis Portable  Result Date: 10/06/2019 CLINICAL DATA:  Mechanical fall EXAM: PORTABLE PELVIS 1-2 VIEWS COMPARISON:  None. FINDINGS: There is no evidence of pelvic fracture or diastasis. No pelvic bone lesions are seen. Slightly limited evaluation of the right pubic rami due to positioning. There are vascular calcifications. IMPRESSION: No definite acute osseous abnormality Electronically Signed   By: Jasmine Pang M.D.   On: 10/06/2019 19:56   CT CHEST ABDOMEN PELVIS W CONTRAST  Result Date: 10/06/2019 CLINICAL DATA:  Fall change in mental status EXAM: CT CHEST, ABDOMEN, AND PELVIS WITH CONTRAST TECHNIQUE: Multidetector CT imaging of the chest, abdomen and pelvis was performed following the standard protocol during bolus administration of intravenous contrast. CONTRAST:  OMNIPAQUE IOHEXOL 300 MG/ML  SOLN COMPARISON:  None. FINDINGS: Cardiovascular: There is mild cardiomegaly. Coronary artery calcifications are present. There is scattered aortic atherosclerosis noted. Calcifications are  also seen within the mitral valve and aortic valve. No significant pericardial fluid/thickening. Great vessels are normal in course and caliber. No evidence of acute thoracic aortic injury. No central pulmonary emboli. Mediastinum/Nodes: No pneumomediastinum. No mediastinal hematoma. Unremarkable esophagus. No axillary, mediastinal or hilar lymphadenopathy. Lungs/Pleura:Again noted is areas of pleural thickening and calcifications at the left lung base. There is stable areas of bronchiectasis and scarring at the left lung base. No large airspace consolidation or pleural effusion are seen. There is centrilobular emphysematous changes seen at both lung apices with scarring. No pneumothorax. Musculoskeletal: There is a obliquely oriented fracture seen through the T3 spinous process and the right T3 inferior articulating facet. There is an acute superior compression fracture of the T4 vertebral body with slight buckling of the posterosuperior cortex. No retropulsion of fragments however is seen. Abdomen/pelvis: Hepatobiliary: Homogeneous hepatic attenuation without traumatic injury. No focal lesion. The patient is status post cholecystectomy. A small amount of pneumobilia is present which could be from prior intervention. For. Pancreas: No evidence for traumatic injury. Portions are partially obscured by adjacent bowel loops and paucity of intra-abdominal fat. No ductal dilatation or inflammation. Spleen: Homogeneous attenuation without traumatic injury. Normal in size. Adrenals/Urinary Tract: No adrenal hemorrhage. Kidneys demonstrate symmetric enhancement and excretion on delayed phase imaging. No evidence or renal injury. Ureters are well opacified proximal through mid portion. Bladder is physiologically distended without wall thickening. Stomach/Bowel: Suboptimally assessed without enteric contrast, allowing for this, no evidence of bowel injury. Stomach physiologically distended. There are no dilated or thickened  small or large bowel loops. Moderate to large amount of colonic stool is present. Scattered colonic diverticula are noted. No evidence of mesenteric hematoma. No free air free fluid. Vascular/Lymphatic: No acute vascular injury. The abdominal aorta and IVC are intact. No evidence of retroperitoneal, abdominal, or pelvic adenopathy. Reproductive: No acute abnormality. Heterogeneously enlarged prostate gland is noted. Other: No focal contusion or abnormality of the abdominal wall. Musculoskeletal: No acute fracture of the lumbar spine or bony pelvis. IMPRESSION: Nondisplaced T3 spinous process and right inferior articulating facet fracture. Probable acute superior compression fracture of the T4 vertebral body with less  than 25% loss in height. No other acute intrathoracic, abdominal, or pelvic injury. Aortic Atherosclerosis (ICD10-I70.0). Electronically Signed   By: Jonna Clark M.D.   On: 10/06/2019 22:21   DG Chest Port 1 View  Result Date: 10/06/2019 CLINICAL DATA:  84 year old male with fall. EXAM: PORTABLE CHEST 1 VIEW COMPARISON:  Chest radiograph dated 06/25/2019. FINDINGS: Background of emphysema. There is a small, likely chronic left pleural effusion and left pleural thickening along the left lung base. Right lung base reticulonodular densities appears similar to prior radiograph, and likely chronic. No new consolidation. There is no pneumothorax. Mild cardiomegaly. Atherosclerotic calcification of the aorta. No acute osseous pathology. IMPRESSION: 1. No acute cardiopulmonary process.  No interval change. 2. Emphysema and chronic left pleural thickening. Electronically Signed   By: Elgie Collard M.D.   On: 10/06/2019 19:55      Assessment/Plan  Acute metabolic encephalopathy Unclear etiology although his UA does show potential UTI.  He is also hyponatremic but this appears to be chronic. CT head negative Keep for observation overnight  Suspected UTI UA shows large leuk, negative nitrite  many WBC and bacteria Start IV Rocephin for now with urine culture  Hyponatremia Sodium of 123.  Patient appears to have chronic hyponatremia in the past ranging from the high 120s to 130s Urine osmolality and urine sodium lab suggest possible hypovolemic hyponatremia will keep on low-dose IV fluids and repeat in the morning  Recurrent fall Patient has had 2 falls in the past 2 days and appears to be mechanical from him tripping on his oxygen cord Will need PT eval  T3 spinous process right inferior articulate facet fracture acute superior compression fracture of T4 vertebral body Patient denies any pain. PT eval  COPD with chronic hypoxemia on 3 L Patient presented initially needing 4 L but was able to wean down to 3 L with good O2 sat Maintain O2 sat between 80% to 92% He has wheezing but does not feel that he is in acute exacerbation Continue bronchodilator  Diastolic congestive heart failure Appears euvolemic on exam Hold Lasix for now as he appears to be hypovolemic  Bilateral conjunctivitis Patient with mild erythema and crusting around the eye.  Will give erythromycin ointment  Hypertension Continue amlodipine, lisinopril and Coreg  DVT prophylaxis:.Lovenox Code Status: Full Family Communication: Plan discussed with daughter [name]) who is HCPOA disposition Plan: Home with observation Consults called:  Admission status: Observation  Status is: Observation  The patient remains OBS appropriate and will d/c before 2 midnights.  Dispo: The patient is from: Home              Anticipated d/c is to: Home              Anticipated d/c date is: 1 day              Patient currently is not medically stable to d/c.         Anselm Jungling DO [facility]   If 7PM-7AM, please contact night-coverage [email or web address]   10/06/2019, 11:42 PM

## 2019-10-06 NOTE — ED Notes (Signed)
Patient transported to CT 

## 2019-10-06 NOTE — ED Triage Notes (Signed)
Pt arrives from home via [facility]. Pt had mechanical fall this afternoon at home, tripping in his oxygen tubing and falling forward. Pt unsure of LOC. Pt was 88% on 2L [place] on arrival pt put on 4L  and O2 sats came to low 90's. Pt reporting productive cough w/ clear phlegm x 2 days. Pt in Af[facility] during transport.

## 2019-10-07 DIAGNOSIS — E871 Hypo-osmolality and hyponatremia: Secondary | ICD-10-CM | POA: Diagnosis present

## 2019-10-07 DIAGNOSIS — H109 Unspecified conjunctivitis: Secondary | ICD-10-CM | POA: Diagnosis present

## 2019-10-07 DIAGNOSIS — Z9049 Acquired absence of other specified parts of digestive tract: Secondary | ICD-10-CM | POA: Diagnosis not present

## 2019-10-07 DIAGNOSIS — Z66 Do not resuscitate: Secondary | ICD-10-CM | POA: Diagnosis present

## 2019-10-07 DIAGNOSIS — I110 Hypertensive heart disease with heart failure: Secondary | ICD-10-CM | POA: Diagnosis present

## 2019-10-07 DIAGNOSIS — I5032 Chronic diastolic (congestive) heart failure: Secondary | ICD-10-CM | POA: Diagnosis not present

## 2019-10-07 DIAGNOSIS — W19XXXA Unspecified fall, initial encounter: Secondary | ICD-10-CM | POA: Diagnosis present

## 2019-10-07 DIAGNOSIS — I5033 Acute on chronic diastolic (congestive) heart failure: Secondary | ICD-10-CM | POA: Diagnosis present

## 2019-10-07 DIAGNOSIS — N390 Urinary tract infection, site not specified: Secondary | ICD-10-CM

## 2019-10-07 DIAGNOSIS — S22049A Unspecified fracture of fourth thoracic vertebra, initial encounter for closed fracture: Secondary | ICD-10-CM | POA: Diagnosis present

## 2019-10-07 DIAGNOSIS — B309 Viral conjunctivitis, unspecified: Secondary | ICD-10-CM | POA: Diagnosis not present

## 2019-10-07 DIAGNOSIS — R296 Repeated falls: Secondary | ICD-10-CM | POA: Diagnosis present

## 2019-10-07 DIAGNOSIS — Y92009 Unspecified place in unspecified non-institutional (private) residence as the place of occurrence of the external cause: Secondary | ICD-10-CM | POA: Diagnosis not present

## 2019-10-07 DIAGNOSIS — W19XXXD Unspecified fall, subsequent encounter: Secondary | ICD-10-CM

## 2019-10-07 DIAGNOSIS — Z9981 Dependence on supplemental oxygen: Secondary | ICD-10-CM | POA: Diagnosis not present

## 2019-10-07 DIAGNOSIS — N400 Enlarged prostate without lower urinary tract symptoms: Secondary | ICD-10-CM | POA: Diagnosis present

## 2019-10-07 DIAGNOSIS — R4182 Altered mental status, unspecified: Secondary | ICD-10-CM | POA: Diagnosis not present

## 2019-10-07 DIAGNOSIS — Z79899 Other long term (current) drug therapy: Secondary | ICD-10-CM | POA: Diagnosis not present

## 2019-10-07 DIAGNOSIS — E875 Hyperkalemia: Secondary | ICD-10-CM | POA: Diagnosis not present

## 2019-10-07 DIAGNOSIS — Z20822 Contact with and (suspected) exposure to covid-19: Secondary | ICD-10-CM | POA: Diagnosis present

## 2019-10-07 DIAGNOSIS — I4510 Unspecified right bundle-branch block: Secondary | ICD-10-CM | POA: Diagnosis present

## 2019-10-07 DIAGNOSIS — J9811 Atelectasis: Secondary | ICD-10-CM | POA: Diagnosis present

## 2019-10-07 DIAGNOSIS — S22039A Unspecified fracture of third thoracic vertebra, initial encounter for closed fracture: Secondary | ICD-10-CM

## 2019-10-07 DIAGNOSIS — Z825 Family history of asthma and other chronic lower respiratory diseases: Secondary | ICD-10-CM | POA: Diagnosis not present

## 2019-10-07 DIAGNOSIS — E7800 Pure hypercholesterolemia, unspecified: Secondary | ICD-10-CM | POA: Diagnosis present

## 2019-10-07 DIAGNOSIS — F17210 Nicotine dependence, cigarettes, uncomplicated: Secondary | ICD-10-CM | POA: Diagnosis present

## 2019-10-07 DIAGNOSIS — S22039D Unspecified fracture of third thoracic vertebra, subsequent encounter for fracture with routine healing: Secondary | ICD-10-CM | POA: Diagnosis not present

## 2019-10-07 DIAGNOSIS — J449 Chronic obstructive pulmonary disease, unspecified: Secondary | ICD-10-CM | POA: Diagnosis present

## 2019-10-07 DIAGNOSIS — R410 Disorientation, unspecified: Secondary | ICD-10-CM | POA: Diagnosis not present

## 2019-10-07 DIAGNOSIS — J9621 Acute and chronic respiratory failure with hypoxia: Secondary | ICD-10-CM | POA: Diagnosis present

## 2019-10-07 DIAGNOSIS — J438 Other emphysema: Secondary | ICD-10-CM

## 2019-10-07 DIAGNOSIS — S22038S Other fracture of third thoracic vertebra, sequela: Secondary | ICD-10-CM

## 2019-10-07 DIAGNOSIS — A419 Sepsis, unspecified organism: Secondary | ICD-10-CM | POA: Diagnosis present

## 2019-10-07 DIAGNOSIS — G9341 Metabolic encephalopathy: Secondary | ICD-10-CM | POA: Diagnosis present

## 2019-10-07 DIAGNOSIS — I10 Essential (primary) hypertension: Secondary | ICD-10-CM | POA: Diagnosis not present

## 2019-10-07 LAB — BASIC METABOLIC PANEL
Anion gap: 9 (ref 5–15)
BUN: 15 mg/dL (ref 8–23)
CO2: 34 mmol/L — ABNORMAL HIGH (ref 22–32)
Calcium: 8.1 mg/dL — ABNORMAL LOW (ref 8.9–10.3)
Chloride: 85 mmol/L — ABNORMAL LOW (ref 98–111)
Creatinine, Ser: 0.48 mg/dL — ABNORMAL LOW (ref 0.61–1.24)
GFR calc Af Amer: >60 mL/min (ref >60)
GFR calc non Af Amer: >60 mL/min (ref >60)
Glucose, Bld: 86 mg/dL (ref 70–99)
Potassium: 3.8 mmol/L (ref 3.5–5.1)
Sodium: 128 mmol/L — ABNORMAL LOW (ref 135–145)

## 2019-10-07 MED ORDER — TAMSULOSIN HCL 0.4 MG PO CAPS
0.4000 mg | ORAL_CAPSULE | Freq: Every day | ORAL | Status: DC
  Administered 2019-10-07 – 2019-10-16 (×10): 0.4 mg via ORAL
  Filled 2019-10-07 (×10): qty 1

## 2019-10-07 MED ORDER — MONTELUKAST SODIUM 10 MG PO TABS
10.0000 mg | ORAL_TABLET | Freq: Every day | ORAL | Status: DC
  Administered 2019-10-07 – 2019-10-16 (×10): 10 mg via ORAL
  Filled 2019-10-07 (×11): qty 1

## 2019-10-07 MED ORDER — LISINOPRIL 5 MG PO TABS
5.0000 mg | ORAL_TABLET | Freq: Every day | ORAL | Status: DC
  Administered 2019-10-07 – 2019-10-12 (×6): 5 mg via ORAL
  Filled 2019-10-07 (×6): qty 1

## 2019-10-07 MED ORDER — AMLODIPINE BESYLATE 10 MG PO TABS
10.0000 mg | ORAL_TABLET | Freq: Every day | ORAL | Status: DC
  Administered 2019-10-07 – 2019-10-16 (×9): 10 mg via ORAL
  Filled 2019-10-07 (×10): qty 1

## 2019-10-07 MED ORDER — FERROUS SULFATE 325 (65 FE) MG PO TABS
325.0000 mg | ORAL_TABLET | Freq: Two times a day (BID) | ORAL | Status: DC
  Administered 2019-10-07 – 2019-10-16 (×18): 325 mg via ORAL
  Filled 2019-10-07 (×19): qty 1

## 2019-10-07 MED ORDER — CARVEDILOL 3.125 MG PO TABS
6.2500 mg | ORAL_TABLET | Freq: Two times a day (BID) | ORAL | Status: DC
  Administered 2019-10-07 – 2019-10-12 (×10): 6.25 mg via ORAL
  Filled 2019-10-07 (×11): qty 2

## 2019-10-07 MED ORDER — ERYTHROMYCIN 5 MG/GM OP OINT
TOPICAL_OINTMENT | Freq: Every day | OPHTHALMIC | Status: DC
  Administered 2019-10-07 – 2019-10-13 (×7): 1 via OPHTHALMIC
  Filled 2019-10-07: qty 1

## 2019-10-07 MED ORDER — PANTOPRAZOLE SODIUM 40 MG PO TBEC
40.0000 mg | DELAYED_RELEASE_TABLET | Freq: Every day | ORAL | Status: DC
  Administered 2019-10-07 – 2019-10-16 (×10): 40 mg via ORAL
  Filled 2019-10-07 (×10): qty 1

## 2019-10-07 MED ORDER — FLUTICASONE-UMECLIDIN-VILANT 100-62.5-25 MCG/INH IN AEPB: 1.0000 | INHALATION_SPRAY | Freq: Every day | RESPIRATORY_TRACT | Status: DC

## 2019-10-07 MED ORDER — UMECLIDINIUM BROMIDE 62.5 MCG/INH IN AEPB
1.0000 | INHALATION_SPRAY | Freq: Every day | RESPIRATORY_TRACT | Status: DC
  Administered 2019-10-07 – 2019-10-16 (×10): 1 via RESPIRATORY_TRACT
  Filled 2019-10-07: qty 7

## 2019-10-07 MED ORDER — IPRATROPIUM-ALBUTEROL 0.5-2.5 (3) MG/3ML IN SOLN: 3.0000 mL | RESPIRATORY_TRACT | Status: DC | PRN

## 2019-10-07 MED ORDER — FLUTICASONE FUROATE-VILANTEROL 100-25 MCG/INH IN AEPB
1.0000 | INHALATION_SPRAY | Freq: Every day | RESPIRATORY_TRACT | Status: DC
  Administered 2019-10-07 – 2019-10-16 (×10): 1 via RESPIRATORY_TRACT
  Filled 2019-10-07: qty 28

## 2019-10-07 NOTE — Progress Notes (Signed)
PT Cancellation Note  Patient Details Name: LEJON AFZAL MRN: [id number] DOB: 1935/12/02   Cancelled Treatment:    Reason Eval/Treat Not Completed: Patient declined, no reason specified   Ezekiel Ina, PT DPT 10/07/2019, 3:34 PM

## 2019-10-07 NOTE — Progress Notes (Signed)
PROGRESS NOTE  Douglas Mathis KZS:[id number] DOB: 10-30-1935 DOA: 10/06/2019 PCP: [facility]  Brief History   Douglas Mathis is a 84 y.o. male with medical history significant for diastolic congestive heart failure, hypertension, COPD chronically on 3 L of O2 who presents for a fall and altered mental status.  Patient was unable to provide any history due to sodium mental status.  History was obtained through the daughter Douglas Mathis who is his HCPOA.  She says that patient lives alone at a senior center and has neighbors that check on him.  Yesterday he sustained a fall in the bathroom and thinks that perhaps he tripped on his oxygen cord.  Then today he was found face down by EMS and they suspect that he fell because he tripped on his O2 cord.  Prior to that, daughter last saw him normal in the morning and states that he was still himself and making breakfast.  He does need to walk with a walker and is compliant with it.  She endorsed that he has been eating and drinking well since she buys his groceries.  He smokes cigarettes but does not drink any alcohol.  She says he was otherwise in his normal state of health and denies any signs of infection.  ED Course: He was afebrile, normotensive initially required up to 4 L via nasal cannula but was able to be turned back down to his home 3 L and remained at 99% oxygen saturation.  Lab work shows leukocytosis of 13.6 which appears to be chronic.  He also was hyponatremic down to 123 which he appears to have chronic hyponatremia around the 130s to high 120s.  Creatinine was normal at 0.67.  UA shows large leuk, negative nitrite and many WBC and bacteria.  CT head and CT cervical spine without any acute findings.  CT abdomen and pelvis showed nondisplaced T3 spinous process and right inferior articular facet fracture.  There is a probable acute superior compression fracture of the T4 vertebral body.  [facility] were  consulted to admit the patient for further evaluation and treatment.  The patient was admitted to a telemetry bed. He has been kept NPO pending swallow evaluation. Chemistry will be followed.  Consultants  . None  Procedures  . None  Antibiotics   Anti-infectives (From admission, onward)   Start     Dose/Rate Route Frequency Ordered Stop   10/07/19 0000  cefTRIAXone (ROCEPHIN) 1 g in sodium chloride 0.9 % 100 mL IVPB     Discontinue     1 g 200 mL/hr over 30 Minutes Intravenous Every 24 hours 10/06/19 2349      .  Subjective  The patient is resting comfortably. He remains confused.  Objective   Vitals:  Vitals:   10/07/19 0513 10/07/19 0800  BP: (!) 121/48 (!) 122/48  Pulse: 63 60  Resp: 20 19  Temp: (!) 97.5 F (36.4 C) 98 F (36.7 C)  SpO2: 98% 97%   Exam:  Constitutional:  . The patient is awake, alert, and oriented x 3. No acute distress. Respiratory:  . No increased work of breathing. . No wheezes, rales, or rhonchi . No tactile fremitus Cardiovascular:  . Regular rate and rhythm . No murmurs, ectopy, or gallups. . No lateral PMI. No thrills. Abdomen:  . Abdomen is soft, non-tender, non-distended . No hernias, masses, or organomegaly . Normoactive bowel sounds.  Musculoskeletal:  . No cyanosis, clubbing, or edema Skin:  . No  rashes, lesions, ulcers . palpation of skin: no induration or nodules Neurologic:  . The patient is unable to cooperate with exam. Psychiatric: The patient is confused. Unable to evaluate due to inability to cooperate with exam.  I have personally reviewed the following:   Today's Data  . Vitals, CMP, CBC  Imaging  . CXR . Pelvis x-ray  Cardiology Data  . EKG  Scheduled Meds: . amLODipine  10 mg Oral Daily  . carvedilol  6.25 mg Oral BID  . enoxaparin (LOVENOX) injection  40 mg Subcutaneous Q24H  . erythromycin   Both Eyes QHS  . ferrous sulfate  325 mg Oral BID WC  . fluticasone furoate-vilanterol  1 puff  Inhalation Daily   And  . umeclidinium bromide  1 puff Inhalation Daily  . lisinopril  5 mg Oral Daily  . montelukast  10 mg Oral Daily  . pantoprazole  40 mg Oral Daily  . tamsulosin  0.4 mg Oral QPC breakfast   Continuous Infusions: . sodium chloride 75 mL/hr at 10/07/19 1014  . cefTRIAXone (ROCEPHIN)  IV 1 g (10/07/19 3151)    Principal Problem:   AMS (altered mental status) Active Problems:   COPD (chronic obstructive pulmonary disease) (HCC)   Chronic diastolic (congestive) heart failure (HCC)   HTN (hypertension)   Hyponatremia   Fall   Spinal fracture of T3 vertebra (HCC)   Acute lower UTI   Conjunctivitis   LOS: 0 days   A & P   Acute metabolic encephalopathy Unclear etiology although his UA does show potential UTI.  He is also hyponatremic but this appears to be chronic. CT head negative Keep for observation overnight  Suspected UTI UA shows large leuk, negative nitrite many WBC and bacteria Start IV Rocephin for now with urine culture  Hyponatremia Sodium of 123.  Patient appears to have chronic hyponatremia in the past ranging from the high 120s to 130s Urine osmolality and urine sodium lab suggest possible hypovolemic hyponatremia will keep on low-dose IV fluids and repeat in the morning  Recurrent fall Patient has had 2 falls in the past 2 days and appears to be mechanical from him tripping on his oxygen cord Will need PT eval  T3 spinous process right inferior articulate facet fracture acute superior compression fracture of T4 vertebral body Patient denies any pain. PT eval  COPD with chronic hypoxemia on 3 L Patient presented initially needing 4 L but was able to wean down to 3 L with good O2 sat Maintain O2 sat between 80% to 92% He has wheezing but does not feel that he is in acute exacerbation Continue bronchodilator  Diastolic congestive heart failure Appears euvolemic on exam Hold Lasix for now as he appears to be  hypovolemic  Bilateral conjunctivitis Patient with mild erythema and crusting around the eye.  Will give erythromycin ointment  Hypertension Continue amlodipine, lisinopril and Coreg  DVT prophylaxis:.Lovenox Code Status: Full Family Communication: Plan discussed with daughter Douglas Mathis) who is HCPOA disposition Plan: Home with observation Consults called:  Admission status: Observation  Status is: Observation  The patient remains OBS appropriate and will d/c before 2 midnights.  Dispo: The patient is from: Home  Anticipated d/c is to: Home  Anticipated d/c date is: 1 day  Patient currently is not medically stable to d/c.  Marikay Roads, DO [facility] Direct contact: see [email or web address]  7PM-7AM contact night coverage as above 10/07/2019, 1:21 PM  LOS: 0 days

## 2019-10-08 DIAGNOSIS — R6520 Severe sepsis without septic shock: Secondary | ICD-10-CM

## 2019-10-08 DIAGNOSIS — A419 Sepsis, unspecified organism: Secondary | ICD-10-CM

## 2019-10-08 LAB — BASIC METABOLIC PANEL
Anion gap: 10 (ref 5–15)
BUN: 17 mg/dL (ref 8–23)
CO2: 33 mmol/L — ABNORMAL HIGH (ref 22–32)
Calcium: 8.1 mg/dL — ABNORMAL LOW (ref 8.9–10.3)
Chloride: 87 mmol/L — ABNORMAL LOW (ref 98–111)
Creatinine, Ser: 0.64 mg/dL (ref 0.61–1.24)
GFR calc Af Amer: >60 mL/min (ref >60)
GFR calc non Af Amer: >60 mL/min (ref >60)
Glucose, Bld: 64 mg/dL — ABNORMAL LOW (ref 70–99)
Potassium: 4.3 mmol/L (ref 3.5–5.1)
Sodium: 130 mmol/L — ABNORMAL LOW (ref 135–145)

## 2019-10-08 LAB — CBC WITH DIFFERENTIAL/PLATELET
Abs Immature Granulocytes: 0.11 10*3/uL — ABNORMAL HIGH (ref 0.00–0.07)
Basophils Absolute: 0 10*3/uL (ref 0.0–0.1)
Basophils Relative: 0 %
Eosinophils Absolute: 0 10*3/uL (ref 0.0–0.5)
Eosinophils Relative: 0 %
HCT: 25.4 % — ABNORMAL LOW (ref 39.0–52.0)
Hemoglobin: 7.9 g/dL — ABNORMAL LOW (ref 13.0–17.0)
Immature Granulocytes: 1 %
Lymphocytes Relative: 6 %
Lymphs Abs: 0.6 10*3/uL — ABNORMAL LOW (ref 0.7–4.0)
MCH: 27.4 pg (ref 26.0–34.0)
MCHC: 31.1 g/dL (ref 30.0–36.0)
MCV: 88.2 fL (ref 80.0–100.0)
Monocytes Absolute: 0.8 10*3/uL (ref 0.1–1.0)
Monocytes Relative: 8 %
Neutro Abs: 8.2 10*3/uL — ABNORMAL HIGH (ref 1.7–7.7)
Neutrophils Relative %: 85 %
Platelets: 216 10*3/uL (ref 150–400)
RBC: 2.88 MIL/uL — ABNORMAL LOW (ref 4.22–5.81)
RDW: 17.2 % — ABNORMAL HIGH (ref 11.5–15.5)
WBC: 9.8 10*3/uL (ref 4.0–10.5)
nRBC: 0 % (ref 0.0–0.2)

## 2019-10-08 MED ORDER — ORAL CARE MOUTH RINSE
15.0000 mL | Freq: Two times a day (BID) | OROMUCOSAL | Status: DC
  Administered 2019-10-08 – 2019-10-16 (×14): 15 mL via OROMUCOSAL

## 2019-10-08 MED ORDER — TRAMADOL HCL 50 MG PO TABS
50.0000 mg | ORAL_TABLET | Freq: Four times a day (QID) | ORAL | Status: DC | PRN
  Administered 2019-10-10 – 2019-10-13 (×3): 50 mg via ORAL
  Filled 2019-10-08 (×3): qty 1

## 2019-10-08 MED ORDER — ACETAMINOPHEN 325 MG PO TABS
650.0000 mg | ORAL_TABLET | Freq: Four times a day (QID) | ORAL | Status: DC | PRN
  Administered 2019-10-08 – 2019-10-16 (×11): 650 mg via ORAL
  Filled 2019-10-08 (×11): qty 2

## 2019-10-08 NOTE — Progress Notes (Signed)
PROGRESS NOTE  Douglas Mathis [id number]:[id number] DOB: 05-Aug-1935 DOA: 10/06/2019 [facility]: [facility]  Brief History   Douglas Mathis is a 84 y.o. male with medical history significant for diastolic congestive heart failure, hypertension, COPD chronically on 3 L of O2 who presents for a fall and altered mental status.  Patient was unable to provide any history due to sodium mental status.  History was obtained through the daughter Douglas Mathis who is his HCPOA.  She says that patient lives alone at a senior center and has neighbors that check on him.  Yesterday he sustained a fall in the bathroom and thinks that perhaps he tripped on his oxygen cord.  Then today he was found face down by EMS and they suspect that he fell because he tripped on his O2 cord.  Prior to that, daughter last saw him normal in the morning and states that he was still himself and making breakfast.  He does need to walk with a walker and is compliant with it.  She endorsed that he has been eating and drinking well since she buys his groceries.  He smokes cigarettes but does not drink any alcohol.  She says he was otherwise in his normal state of health and denies any signs of infection.  ED Course: He was afebrile, normotensive initially required up to 4 L via nasal cannula but was able to be turned back down to his home 3 L and remained at 99% oxygen saturation.  Lab work shows leukocytosis of 13.6 which appears to be chronic.  He also was hyponatremic down to 123 which he appears to have chronic hyponatremia around the 130s to high 120s.  Creatinine was normal at 0.67.  UA shows large leuk, negative nitrite and many WBC and bacteria.  CT head and CT cervical spine without any acute findings.  CT abdomen and pelvis showed nondisplaced T3 spinous process and right inferior articular facet fracture.  There is a probable acute superior compression fracture of the T4 vertebral body.  [facility] were  consulted to admit the patient for further evaluation and treatment.  The patient was admitted to a telemetry bed. He has been kept NPO pending swallow evaluation. Chemistry will be followed.  Consultants  . None  Procedures  . None  Antibiotics   Anti-infectives (From admission, onward)   Start     Dose/Rate Route Frequency Ordered Stop   10/07/19 0000  cefTRIAXone (ROCEPHIN) 1 g in sodium chloride 0.9 % 100 mL IVPB     Discontinue     1 g 200 mL/hr over 30 Minutes Intravenous Every 24 hours 10/06/19 2349       Subjective  The patient is resting comfortably. Sensorium somewhat confused.  Objective   Vitals:  Vitals:   10/08/19 0757 10/08/19 1535  BP: (!) 114/43 (!) 131/42  Pulse: 71 (!) 59  Resp: 16 15  Temp: 97.9 F (36.6 C) 98.4 F (36.9 C)  SpO2: 92% 92%   Exam:  Constitutional:  . The patient is awake, alert, and oriented x 3. No acute distress. Respiratory:  . No increased work of breathing. . No wheezes, rales, or rhonchi . No tactile fremitus Cardiovascular:  . Regular rate and rhythm . No murmurs, ectopy, or gallups. . No lateral PMI. No thrills. Abdomen:  . Abdomen is soft, non-tender, non-distended . No hernias, masses, or organomegaly . Normoactive bowel sounds.  Musculoskeletal:  . No cyanosis, clubbing, or edema Skin:  . No rashes,  lesions, ulcers . palpation of skin: no induration or nodules Neurologic:  . The patient is unable to cooperate with exam. Psychiatric: The patient is confused. Unable to evaluate due to inability to cooperate with exam.  I have personally reviewed the following:   Today's Data  . Vitals, CMP, CBC  Imaging  . CXR . Pelvis x-ray  Cardiology Data  . EKG  Scheduled Meds: . amLODipine  10 mg Oral Daily  . carvedilol  6.25 mg Oral BID  . enoxaparin (LOVENOX) injection  40 mg Subcutaneous Q24H  . erythromycin   Both Eyes QHS  . ferrous sulfate  325 mg Oral BID WC  . fluticasone furoate-vilanterol  1 puff  Inhalation Daily   And  . umeclidinium bromide  1 puff Inhalation Daily  . lisinopril  5 mg Oral Daily  . mouth rinse  15 mL Mouth Rinse BID  . montelukast  10 mg Oral Daily  . pantoprazole  40 mg Oral Daily  . tamsulosin  0.4 mg Oral QPC breakfast   Continuous Infusions: . sodium chloride 75 mL (10/08/19 0957)  . cefTRIAXone (ROCEPHIN)  IV 1 g (10/07/19 2346)    Principal Problem:   AMS (altered mental status) Active Problems:   COPD (chronic obstructive pulmonary disease) (HCC)   Chronic diastolic (congestive) heart failure (HCC)   HTN (hypertension)   Hyponatremia   Fall   Spinal fracture of T3 vertebra (HCC)   Acute lower UTI   Conjunctivitis   Sepsis (HCC)   LOS: 1 day   A & P   Acute metabolic encephalopathy: Improving. Unknown how much of his cognitive deficit is chronic. Hyponatremia is correcting. UTI is being treated with Rocephin.  UTI: UA shows large leuk, negative nitrite many WBC and bacteria. Start IV Rocephin for now with urine culture pending.  Hyponatremia: Resolving. 123 upon admission. 130 this morning.  Patient appears to have chronic hyponatremia in the past ranging from the high 120s to 130s. Urine osmolality and urine sodium lab suggest possible hypovolemic hyponatremia. Will stop IV fluids and monitor sodium.  Recurrent fall: Patient has had 2 falls in the past 2 days and appears to be mechanical from him tripping on his oxygen cord. PT has evaluated the patient and has recommended SNF placement. TOC has been consulted.  T3 spinous process, right inferior articulate facet fracture, and acute superior compression fracture of T4 vertebral body: The patient denies any pain. Per PT the patient will require SNF placement.  COPD with chronic hypoxemia on 3 L: Patient presented initially needing 4 L but was able to wean down to 3 L with good O2 sat Maintain O2 sat between 80% to 92%. He has wheezing but does not feel that he is in acute  exacerbation. Continue bronchodilators.  Diastolic congestive heart failure: Appears euvolemic on exam.  Hold Lasix for now as he appears to be hypovolemic. Monitor volume status carefully.  Bilateral conjunctivitis: Patient with mild erythema and crusting around the eye. Improving with erythromycin ointment.  Essential Hypertension: Continue amlodipine, lisinopril and Coreg  I have seen and examined this patient myself. I have spent 35 minutes in his evaluation and care.  DVT prophylaxis:.Lovenox Code Status: Full Family Communication: None available. Disposition Plan:   Status is: Inpatient  The patient remains Inpatient appropriate and will d/c after 2 midnights.  Dispo: The patient is from: Home  Anticipated d/c is to: SNF  Anticipated d/c date is: 2 day  Patient currently is not medically stable to d/c.  Kaimana Neuzil, DO [facility] Direct contact: see [email or web address]  7PM-7AM contact night coverage as above 10/08/2019, 5:34 PM  LOS: 0 days

## 2019-10-08 NOTE — Evaluation (Signed)
Physical Therapy Evaluation Patient Details Name: Douglas Mathis MRN: [id number] DOB: April 24, 1935 Today's Date: 10/08/2019   History of Present Illness  84 y.o. male with medical history significant for diastolic congestive heart failure, hypertension, COPD chronically on 3 L of O2 who presents for a fall and altered mental status. Recurrent fall with 2 falls in the past 2 days prior to admission, appears to be mechanical tripping on oxygen cord. T 3 spinous process right inferior articulate facet fracture, acute superior compression fracture of T4 vertebral body.    Clinical Impression  PT evaluation completed. Patient cooperative during session, able to follow single step commands, but difficult to understand speech at times. Patient required Min A for standing from bed with posterior lean. Patient needs Min guard assistance for ambulating a short distance of 20 ft with rolling walker. Activity tolerance is limited by fatigue. Sp02 immediately after walking was 80% on 3 L02. Several minutes required to increase to 90% in seated position. Patient reports he wears oxygen at home, but only intermittently. Recommend PT to address functional limitations listed below to maximize function and safety.  As patient has had multiple falls at home and lives alone, SNF is recommended for short term rehab at discharge.     Follow Up Recommendations SNF (would need supervision for mobility if going home )    Equipment Recommendations  Rolling walker with 5" wheels    Recommendations for Other Services       Precautions / Restrictions Precautions Precautions: Fall Restrictions Weight Bearing Restrictions: No      Mobility  Bed Mobility Overal bed mobility: Needs Assistance Bed Mobility: Supine to Sit;Sit to Supine     Supine to sit: Supervision Sit to supine: Supervision   General bed mobility comments: extra time required to complete tasks. no physical assistance required, supervision for  safety   Transfers Overall transfer level: Needs assistance Equipment used: Rolling walker (2 wheeled) Transfers: Sit to/from Stand Sit to Stand: Min assist         General transfer comment: Min assistance as patient with posterior lean in standing position   Ambulation/Gait Ambulation/Gait assistance: Min guard Gait Distance (Feet): 20 Feet Assistive device: Rolling walker (2 wheeled) Gait Pattern/deviations: Narrow base of support Gait velocity: decreased   General Gait Details: Min guard for safety. Patient fatigued with walking, and Sp02 80% measured on 3 L after walking. Several minutes required for Sp02 to increase to 90% while patient seated after walking. DO in room and requested to turn oxygen up to 4 L02. Nurse also alerted   Stairs            Wheelchair Mobility    Modified Rankin (Stroke Patients Only)       Balance Overall balance assessment: Needs assistance Sitting-balance support: Feet supported Sitting balance-Leahy Scale: Fair Sitting balance - Comments: posterior lean initially. Supervision for safety    Standing balance support: Bilateral upper extremity supported Standing balance-Leahy Scale: Fair Standing balance comment: patient relying on rolling walker for support in standing position                              Pertinent Vitals/Pain Pain Assessment: No/denies pain    Home Living Family/patient expects to be discharged to:: Private residence Living Arrangements: Alone Available Help at Discharge: Family;Available PRN/intermittently Type of Home: Apartment Home Access: Elevator     Home Layout: One level Home Equipment: Walker - 2 wheels  Prior Function Level of Independence: Independent with assistive device(s);Needs assistance   Gait / Transfers Assistance Needed: Mod I with four wheeled walker for ambulation.      Comments: patient reports he uses oxygen only intermittently at home      Hand Dominance         Extremity/Trunk Assessment   Upper Extremity Assessment Upper Extremity Assessment: Overall WFL for tasks assessed    Lower Extremity Assessment Lower Extremity Assessment: Generalized weakness       Communication   Communication:  (difficult to understand at times )  Cognition Arousal/Alertness: Awake/alert Behavior During Therapy: WFL for tasks assessed/performed Overall Cognitive Status: Within Functional Limits for tasks assessed                                        General Comments      Exercises     Assessment/Plan    PT Assessment Patient needs continued PT services  PT Problem List Decreased strength;Decreased balance;Decreased activity tolerance;Decreased mobility;Decreased cognition;Cardiopulmonary status limiting activity;Decreased knowledge of precautions;Decreased safety awareness;Decreased knowledge of use of DME       PT Treatment Interventions DME instruction;Gait training;Functional mobility training;Therapeutic activities;Therapeutic exercise;Balance training;Neuromuscular re-education;Patient/family education    PT Goals (Current goals can be found in the Care Plan section)  Acute Rehab PT Goals Patient Stated Goal: to go home  PT Goal Formulation: With patient Time For Goal Achievement: 10/22/19 Potential to Achieve Goals: Good    Frequency Min 2X/week   Barriers to discharge Decreased caregiver support patient lives alone with intermittent support from family/friends/neighbors     Co-evaluation               AM-PAC PT "6 Clicks" Mobility  Outcome Measure Help needed turning from your back to your side while in a flat bed without using bedrails?: A Little Help needed moving from lying on your back to sitting on the side of a flat bed without using bedrails?: A Little Help needed moving to and from a bed to a chair (including a wheelchair)?: A Little Help needed standing up from a chair using your arms (e.g.,  wheelchair or bedside chair)?: A Little Help needed to walk in hospital room?: A Little Help needed climbing 3-5 steps with a railing? : A Lot 6 Click Score: 17    End of Session Equipment Utilized During Treatment: Gait belt;Oxygen Activity Tolerance: Patient limited by fatigue Patient left: in bed;with call bell/phone within reach;with bed alarm set Nurse Communication: Mobility status;Other (comment) (oxygen turned up to 4 L per DO request ) PT Visit Diagnosis: Unsteadiness on feet (R26.81);Muscle weakness (generalized) (M62.81);Repeated falls (R29.6)    Time: 8115-7262 PT Time Calculation (min) (ACUTE ONLY): 27 min   Charges:   PT Evaluation $PT Eval Moderate Complexity: 1 Mod PT Treatments $Therapeutic Activity: 8-22 mins        Donna Bernard, PT, MPT   Ina Homes 10/08/2019, 11:49 AM

## 2019-10-09 DIAGNOSIS — S22039D Unspecified fracture of third thoracic vertebra, subsequent encounter for fracture with routine healing: Secondary | ICD-10-CM

## 2019-10-09 LAB — BASIC METABOLIC PANEL
Anion gap: 11 (ref 5–15)
BUN: 15 mg/dL (ref 8–23)
CO2: 31 mmol/L (ref 22–32)
Calcium: 8.4 mg/dL — ABNORMAL LOW (ref 8.9–10.3)
Chloride: 86 mmol/L — ABNORMAL LOW (ref 98–111)
Creatinine, Ser: 0.72 mg/dL (ref 0.61–1.24)
GFR calc Af Amer: >60 mL/min (ref >60)
GFR calc non Af Amer: >60 mL/min (ref >60)
Glucose, Bld: 63 mg/dL — ABNORMAL LOW (ref 70–99)
Potassium: 4.2 mmol/L (ref 3.5–5.1)
Sodium: 128 mmol/L — ABNORMAL LOW (ref 135–145)

## 2019-10-09 LAB — CBC
HCT: 25.8 % — ABNORMAL LOW (ref 39.0–52.0)
Hemoglobin: 7.9 g/dL — ABNORMAL LOW (ref 13.0–17.0)
MCH: 27.3 pg (ref 26.0–34.0)
MCHC: 30.6 g/dL (ref 30.0–36.0)
MCV: 89.3 fL (ref 80.0–100.0)
Platelets: 217 10*3/uL (ref 150–400)
RBC: 2.89 MIL/uL — ABNORMAL LOW (ref 4.22–5.81)
RDW: 17.4 % — ABNORMAL HIGH (ref 11.5–15.5)
WBC: 10.4 10*3/uL (ref 4.0–10.5)
nRBC: 0 % (ref 0.0–0.2)

## 2019-10-09 LAB — AMMONIA: Ammonia: 36 umol/L — ABNORMAL HIGH (ref 9–35)

## 2019-10-09 NOTE — Progress Notes (Addendum)
Physical Therapy Treatment Patient Details Name: Douglas FANIEL MRN: [id number] DOB: 1935/04/05 Today's Date: 10/09/2019    History of Present Illness 84 y.o. male with medical history significant for diastolic congestive heart failure, hypertension, COPD chronically on 3 L of O2 who presents for a fall and altered mental status. Recurrent fall with 2 falls in the past 2 days prior to admission, appears to be mechanical tripping on oxygen cord. T 3 spinous process right inferior articulate facet fracture, acute superior compression fracture of T4 vertebral body    PT Comments    Patient is pleasant and cooperative, agreeable to PT. Patient needs supervision for bed mobility with extra time required to complete tasks. Min guard assistance for transfers with cues for technique. Min guard to ambulate 56ft with rolling walker with activity tolerance limited by fatigue and shortness of breath. Sp02 83% initially after walking and takes several minutes to increase to 91% with cues for breathing techniques. Patient able to participate in seated LE strengthening therapeutic exercises. Current discharge recommendation of SNF is appropriate given patient lives alone, risk for falls with history of multiple recent falls at home, and generalized deconditioning. Recommend to continue PT to maximize function and safety to facilitate independence.    Follow Up Recommendations  SNF     Equipment Recommendations  Rolling walker with 5" wheels    Recommendations for Other Services       Precautions / Restrictions Precautions Precautions: Fall Restrictions Weight Bearing Restrictions: No    Mobility  Bed Mobility Overal bed mobility: Needs Assistance Bed Mobility: Supine to Sit     Supine to sit: Supervision     General bed mobility comments: supervision for safety. extra time required to complete tasks   Transfers Overall transfer level: Needs assistance Equipment used: Rolling walker (2  wheeled) Transfers: Sit to/from Stand Sit to Stand: Min guard         General transfer comment: verbal cues for safety and technique   Ambulation/Gait Ambulation/Gait assistance: Min guard Gait Distance (Feet): 16 Feet Assistive device: Rolling walker (2 wheeled) Gait Pattern/deviations: Trunk flexed;Narrow base of support Gait velocity: decreased    General Gait Details: Min guard and cues for safety with using rolling walker. Sp02 decreased to 84% with ambulation and patient required several minute seated rest break and cues for breathing techniques to increase to 91%. Patient is fatigued with activity    Stairs             Wheelchair Mobility    Modified Rankin (Stroke Patients Only)       Balance Overall balance assessment: Needs assistance Sitting-balance support: Feet supported Sitting balance-Leahy Scale: Fair Sitting balance - Comments: posterior lean initially. Supervision for safety    Standing balance support: Bilateral upper extremity supported Standing balance-Leahy Scale: Fair Standing balance comment: patient relying on rolling walker for support in standing position                             Cognition Arousal/Alertness: Awake/alert Behavior During Therapy: WFL for tasks assessed/performed Overall Cognitive Status: Within Functional Limits for tasks assessed                                        Exercises General Exercises - Lower Extremity Ankle Circles/Pumps: AROM;Strengthening;Both;10 reps;Seated Heel Slides: Strengthening;Both;10 reps;Seated;AAROM Hip ABduction/ADduction: AAROM;Strengthening;Both;10 reps;Seated Straight Leg Raises:  AAROM;Strengthening;Both;Seated;10 reps    General Comments        Pertinent Vitals/Pain Pain Assessment: No/denies pain    Home Living                      Prior Function            PT Goals (current goals can now be found in the care plan section) Acute  Rehab PT Goals Patient Stated Goal: to go home  PT Goal Formulation: With patient Time For Goal Achievement: 10/22/19 Potential to Achieve Goals: Good Progress towards PT goals: Progressing toward goals    Frequency    Min 2X/week      PT Plan Current plan remains appropriate    Co-evaluation              AM-PAC PT "6 Clicks" Mobility   Outcome Measure  Help needed turning from your back to your side while in a flat bed without using bedrails?: A Little Help needed moving from lying on your back to sitting on the side of a flat bed without using bedrails?: A Little Help needed moving to and from a bed to a chair (including a wheelchair)?: A Little Help needed standing up from a chair using your arms (e.g., wheelchair or bedside chair)?: A Little Help needed to walk in hospital room?: A Little Help needed climbing 3-5 steps with a railing? : A Lot 6 Click Score: 17    End of Session Equipment Utilized During Treatment: Gait belt;Oxygen Activity Tolerance: Patient limited by fatigue Patient left: in chair;with call bell/phone within reach;with chair alarm set Nurse Communication: Mobility status PT Visit Diagnosis: Unsteadiness on feet (R26.81);Muscle weakness (generalized) (M62.81);Repeated falls (R29.6)     Time: 5701-7793 PT Time Calculation (min) (ACUTE ONLY): 25 min  Charges:               PT Treatments   $Therapeutic Activity: 8-22 mins   $Therapeutic Exercise: 8-22 mins        Donna Bernard, PT, MPT    Ina Homes 10/09/2019, 9:47 AM

## 2019-10-09 NOTE — Progress Notes (Signed)
Daughter Corrie Dandy called and requests that patient go home at time of discharge, stating "There are neighbors that can check on him and I am close by". Patient remains NPO except meds, awaiting speech consult.

## 2019-10-09 NOTE — Progress Notes (Signed)
PROGRESS NOTE  Douglas Mathis NLZ:[id number] DOB: [birth year] DOA: 10/06/2019 PCP: [facility]  Brief History   Douglas Mathis is a 84 y.o. male with medical history significant for diastolic congestive heart failure, hypertension, COPD chronically on 3 L of O2 who presents for a fall and altered mental status.  Patient was unable to provide any history due to sodium mental status.  History was obtained through the daughter [name] who is his HCPOA.  She says that patient lives alone at a senior center and has neighbors that check on him.  Yesterday he sustained a fall in the bathroom and thinks that perhaps he tripped on his oxygen cord.  Then today he was found face down by EMS and they suspect that he fell because he tripped on his O2 cord.  Prior to that, daughter last saw him normal in the morning and states that he was still himself and making breakfast.  He does need to walk with a walker and is compliant with it.  She endorsed that he has been eating and drinking well since she buys his groceries.  He smokes cigarettes but does not drink any alcohol.  She says he was otherwise in his normal state of health and denies any signs of infection.  ED Course: He was afebrile, normotensive initially required up to 4 L via nasal cannula but was able to be turned back down to his home 3 L and remained at 99% oxygen saturation.  Lab work shows leukocytosis of 13.6 which appears to be chronic.  He also was hyponatremic down to 123 which he appears to have chronic hyponatremia around the 130s to high 120s.  Creatinine was normal at 0.67.  UA shows large leuk, negative nitrite and many WBC and bacteria.  CT head and CT cervical spine without any acute findings.  CT abdomen and pelvis showed nondisplaced T3 spinous process and right inferior articular facet fracture.  There is a probable acute superior compression fracture of the T4 vertebral body.  [facility] were  consulted to admit the patient for further evaluation and treatment.  The patient was admitted to a telemetry bed. He has been kept NPO pending swallow evaluation. Chemistry will be followed.  Consultants   None  Procedures   None  Antibiotics   Anti-infectives (From admission, onward)   Start     Dose/Rate Route Frequency Ordered Stop   10/07/19 0000  cefTRIAXone (ROCEPHIN) 1 g in sodium chloride 0.9 % 100 mL IVPB     Discontinue     1 g 200 mL/hr over 30 Minutes Intravenous Every 24 hours 10/06/19 2349       Subjective  The patient is resting comfortably. Speech is difficult to understand.   Objective   Vitals:  Vitals:   10/08/19 2335 10/09/19 0728  BP: (!) 120/49 (!) 139/43  Pulse: (!) 55 (!) 58  Resp: 16   Temp: 97.8 F (36.6 C) (!) 97.5 F (36.4 C)  SpO2: 95% 100%   Exam:  Constitutional:   The patient is awake, alert, and oriented x 3. No acute distress. Respiratory:   No increased work of breathing.  No wheezes, rales, or rhonchi  No tactile fremitus Cardiovascular:   Regular rate and rhythm  No murmurs, ectopy, or gallups.  No lateral PMI. No thrills. Abdomen:   Abdomen is soft, non-tender, non-distended  No hernias, masses, or organomegaly  Normoactive bowel sounds.  Musculoskeletal:   No cyanosis, clubbing, or edema  Skin:   No rashes, lesions, ulcers  palpation of skin: no induration or nodules Neurologic:   The patient is unable to cooperate with exam. Psychiatric: The patient is confused. Unable to evaluate due to inability to cooperate with exam.  I have personally reviewed the following:   Today's Data   Vitals, BMP, CBC  Imaging   CXR  Pelvis x-ray  Cardiology Data   EKG  Scheduled Meds:  amLODipine  10 mg Oral Daily   carvedilol  6.25 mg Oral BID   enoxaparin (LOVENOX) injection  40 mg Subcutaneous Q24H   erythromycin   Both Eyes QHS   ferrous sulfate  325 mg Oral BID WC   fluticasone  furoate-vilanterol  1 puff Inhalation Daily   And   umeclidinium bromide  1 puff Inhalation Daily   lisinopril  5 mg Oral Daily   mouth rinse  15 mL Mouth Rinse BID   montelukast  10 mg Oral Daily   pantoprazole  40 mg Oral Daily   tamsulosin  0.4 mg Oral QPC breakfast   Continuous Infusions:  cefTRIAXone (ROCEPHIN)  IV 1 g (10/09/19 0045)    Principal Problem:   AMS (altered mental status) Active Problems:   COPD (chronic obstructive pulmonary disease) (HCC)   Chronic diastolic (congestive) heart failure (HCC)   HTN (hypertension)   Hyponatremia   Fall   Spinal fracture of T3 vertebra (HCC)   Acute lower UTI   Conjunctivitis   Sepsis (HCC)   LOS: 2 days   A & P   Acute metabolic encephalopathy: Improving. Unknown how much of his cognitive deficit is chronic. Hyponatremia is correcting. UTI is being treated with Rocephin.  UTI: UA shows large leuk, negative nitrite many WBC and bacteria. Start IV Rocephin for now with urine culture pending.  Hyponatremia: Resolving. 123 upon admission. 128 this morning.  Patient appears to have chronic hyponatremia in the past ranging from the high 120s to 130s. Urine osmolality and urine sodium lab suggest possible hypovolemic hyponatremia. Will stop IV fluids and monitor sodium.  Recurrent fall: Patient has had 2 falls in the past 2 days and appears to be mechanical from him tripping on his oxygen cord. PT has evaluated the patient and has recommended SNF placement. TOC has been consulted.  T3 spinous process, right inferior articulate facet fracture, and acute superior compression fracture of T4 vertebral body: The patient denies any pain. Per PT the patient will require SNF placement.  COPD with chronic hypoxemia on 3 L: Patient presented initially needing 4 L but was able to wean down to 3 L with good O2 sat Maintain O2 sat between 80% to 92%. He has wheezing but does not feel that he is in acute exacerbation. Continue  bronchodilators.  Diastolic congestive heart failure: Appears euvolemic on exam.  Hold Lasix for now as he appears to be hypovolemic. Monitor volume status carefully.  Bilateral conjunctivitis: Patient with mild erythema and crusting around the eye. Improving with erythromycin ointment.  Essential Hypertension: Continue amlodipine, lisinopril and Coreg  I have seen and examined this patient myself. I have spent 32 minutes in his evaluation and care.  DVT prophylaxis:.Lovenox Code Status: Full Family Communication: None available. Disposition Plan:   Status is: Inpatient  The patient remains Inpatient appropriate and will d/c after 2 midnights.  Dispo: The patient is from: Home  Anticipated d/c is to: SNF  Anticipated d/c date is: 2 day  Patient currently is not medically stable to d/c.  Napoleon Monacelli,  DO [facility] Direct contact: see [email or web address]  7PM-7AM contact night coverage as above 10/09/2019, 1:31 PM  LOS: 0 days

## 2019-10-09 NOTE — Evaluation (Signed)
Clinical/Bedside Swallow Evaluation Patient Details  Name: Douglas Mathis MRN: [id number] Date of Birth: Apr 26, 1935  Today's Date: 10/09/2019 Time: SLP Start Time (ACUTE ONLY): 1255 SLP Stop Time (ACUTE ONLY): 1355 SLP Time Calculation (min) (ACUTE ONLY): 60 min  Past Medical History:  Past Medical History:  Diagnosis Date  . Anemia   . BPH (benign prostatic hyperplasia)   . CHF (congestive heart failure) (HCC)   . COPD (chronic obstructive pulmonary disease) (HCC)   . Hypercholesterolemia   . Hypertension    Past Surgical History:  Past Surgical History:  Procedure Laterality Date  . cataracts    . ESOPHAGOGASTRODUODENOSCOPY N/A 07/10/2015   Procedure: ESOPHAGOGASTRODUODENOSCOPY (EGD);  Surgeon: Scot Jun, MD;  Location: [facility] ENDOSCOPY;  Service: Endoscopy;  Laterality: N/A;  . ESOPHAGOGASTRODUODENOSCOPY N/A 01/18/2018   Procedure: ESOPHAGOGASTRODUODENOSCOPY (EGD);  Surgeon: Toledo, Boykin Nearing, MD;  Location: [facility] ENDOSCOPY;  Service: Gastroenterology;  Laterality: N/A;   HPI:  Pt is a 84 y.o.malewith medical history significant fordiastolic congestive heart failure, hypertension, COPD chronically on 3 L of O2 who presents for a fall and altered mental status. History was obtained through the daughter Douglas Mathis who is his HCPOA.  She says that patient lives alone at a senior center and has neighbors that check on him.  Yesterday he sustained a fall in the bathroom and thinks that perhaps he tripped on his oxygen cord.  Then today he was found face down by EMS and they suspect that he fell because he tripped on his O2 cord.  Prior to that, daughter last saw him normal in the morning and states that he was still himself and making breakfast.  He does need to walk with a walker and is compliant with it.  She endorsed that he has been eating and drinking well since she buys his groceries.  He smokes cigarettes but does not drink any alcohol.  She says he was otherwise in his normal  state of health and denies any signs of infection. Pt admitted w/ UTI and Acute metabolic encephalopathy. CXR: "No acute cardiopulmonary process, Emphysema".  Per Head CT previous admit, "Atrophy and chronic microvascular ischemic changes are again noted".  Assessment / Plan / Recommendation Clinical Impression  Pt appears to present w/ adequate oropharyngeal phase swallow function w/ No overt oropharyngeal phase dysphagia noted, No neuromuscular deficits noted.Pt consumed po trials w/ No overt, clinical s/s of aspiration during po trials. Pt appears at reduced risk for aspiration when following general aspiration precautions. Pt is Edentulous and stated he "sometimes" wears a Denture plate at his home. During po trials, pt consumed all consistencies w/ no overt coughing, decline in vocal quality, or change in respiratory presentation during/post trials. Oral phase appeared [facility] w/ timely bolus management and control of bolus propulsion for A-P transfer for swallowing w/ liquids and purees; w/ increased texture of soft solids, pt required extra Time for gumming/mashing of softened solids given w/ puree. Oral clearing achieved w/ all trial consistencies given Time and alternating foods, liquids. OM Exam appeared [facility] w/ no unilateral weakness noted. Speech Clear. Pt fed self w/ setup support.  Recommend a Mech Soft w/ Minced meats consistency diet w/ Gravies to moistened foods; Thin liquidsVIA CUP - pt does not use straws at home. Recommend general aspiration precautions, Pills WHOLE in Puree for safer, easier swallowing as pt described Larger pills causing difficulty to swallow "sometimes". Education given on Pills in Puree; food consistencies and easy to eat options; general aspiration precautions. NSG to  reconsult if any new needs arise. NSG updated/agreed. SLP Visit Diagnosis: Dysphagia, unspecified (R13.10)    Aspiration Risk  Mild aspiration risk;Risk for inadequate nutrition/hydration (reduced  following general precautions)    Diet Recommendation  Mech Soft diet w/ Minced Meats, Gravies to moisten foods; Thin liquids. General aspiration precautions. Tray setup at meals.  Medication Administration: Whole meds with puree (for safer swallowing)    Other  Recommendations Recommended Consults:  (Dietician f/u) Oral Care Recommendations: Oral care BID;Oral care before and after PO;Staff/trained caregiver to provide oral care Other Recommendations:  (n/a)   Follow up Recommendations None      Frequency and Duration  (n/a)   (n/a)       Prognosis Prognosis for Safe Diet Advancement: Fair (-Good) Barriers to Reach Goals: Time post onset;Severity of deficits (lacks dentition)      Swallow Study   General Date of Onset: 10/06/19 HPI: Pt is a 84 y.o.malewith medical history significant fordiastolic congestive heart failure, hypertension, COPD chronically on 3 L of O2 who presents for a fall and altered mental status. History was obtained through the daughter Douglas Mathis who is his HCPOA.  She says that patient lives alone at a senior center and has neighbors that check on him.  Yesterday he sustained a fall in the bathroom and thinks that perhaps he tripped on his oxygen cord.  Then today he was found face down by EMS and they suspect that he fell because he tripped on his O2 cord.  Prior to that, daughter last saw him normal in the morning and states that he was still himself and making breakfast.  He does need to walk with a walker and is compliant with it.  She endorsed that he has been eating and drinking well since she buys his groceries.  He smokes cigarettes but does not drink any alcohol.  She says he was otherwise in his normal state of health and denies any signs of infection. Pt admitted w/ UTI and Acute metabolic encephalopathy. CXR: "No acute cardiopulmonary process, Emphysema".  Type of Study: Bedside Swallow Evaluation Previous Swallow Assessment: none reported Diet Prior to  this Study: NPO Temperature Spikes Noted: No (wbc 10.4) Respiratory Status: Room air History of Recent Intubation: No Behavior/Cognition: Alert;Cooperative;Pleasant mood;Distractible;Requires cueing Oral Cavity Assessment: Dry Oral Care Completed by SLP: Yes Oral Cavity - Dentition: Edentulous Vision: Functional for self-feeding Self-Feeding Abilities: Able to feed self;Needs assist;Needs set up Patient Positioning: Upright in chair (needed min support) Baseline Vocal Quality: Low vocal intensity (muttered/mumbled speech) Volitional Cough: Strong Volitional Swallow: Able to elicit    Oral/Motor/Sensory Function Overall Oral Motor/Sensory Function: Within functional limits   Ice Chips Ice chips: Within functional limits Presentation: Spoon (fed; 2 trials)   Thin Liquid Thin Liquid: Within functional limits Presentation: Self Fed;Cup (~6+ ozs) Other Comments: he declined using a straw -- didn't want to and doesn't use at home    Nectar Thick Nectar Thick Liquid: Not tested   Honey Thick Honey Thick Liquid: Not tested   Puree Puree: Within functional limits Presentation: Self Fed;Spoon (supported; 10 trials)   Solid     Solid: Impaired Presentation: Spoon (fed; 5 trials) Oral Phase Impairments: Impaired mastication (edentulous) Oral Phase Functional Implications: Impaired mastication Pharyngeal Phase Impairments:  (none)       Jerilynn Som, MS, CCC-SLP Gustavo Dispenza 10/09/2019,2:26 PM

## 2019-10-09 NOTE — Progress Notes (Signed)
Ate well with Dys3, thin diet after swallow eval. Up in chair most of day. Large soft BM, black..taking iron. Speech clearer this afternoon. Spoke with daughter with update.

## 2019-10-10 LAB — BASIC METABOLIC PANEL
Anion gap: 10 (ref 5–15)
BUN: 12 mg/dL (ref 8–23)
CO2: 33 mmol/L — ABNORMAL HIGH (ref 22–32)
Calcium: 8.3 mg/dL — ABNORMAL LOW (ref 8.9–10.3)
Chloride: 86 mmol/L — ABNORMAL LOW (ref 98–111)
Creatinine, Ser: 0.57 mg/dL — ABNORMAL LOW (ref 0.61–1.24)
GFR calc Af Amer: >60 mL/min (ref >60)
GFR calc non Af Amer: >60 mL/min (ref >60)
Glucose, Bld: 158 mg/dL — ABNORMAL HIGH (ref 70–99)
Potassium: 4.4 mmol/L (ref 3.5–5.1)
Sodium: 129 mmol/L — ABNORMAL LOW (ref 135–145)

## 2019-10-10 LAB — URINE CULTURE: Culture: NO GROWTH

## 2019-10-10 NOTE — Progress Notes (Signed)
Pt was tried to wean off from O2 but pt desaturate to 85%. O2 was placed again to 2L, SPO2 came back to 95%. RN was notified by PT and Charge Nurse that pt has SPO2 between 50's to 60's. RN assessed the pt, pt is alert and orriented. Fingers were cold and may have affect the reading of the machine. Pt left with SPO2 of 100% at 2L odf O2. Other VS remain stable.

## 2019-10-10 NOTE — Progress Notes (Signed)
PT Cancellation Note  Patient Details Name: Douglas Mathis MRN: [id number] DOB: [birth year]   Cancelled Treatment:    Reason Eval/Treat Not Completed: Medical issues which prohibited therapy (Pt asleep upon entry, 2L donned via [place]. O2 sats in 50s/60s per 2 devices.) Portable device showing severe hypoxia, author obtained a dinamap for 2nd data set, which also showed severe hypoxia. Pt moved to 5L, easily awakened, given cues for breathing. O2 sats steadily improving whilst RN is fetched. Author exits while RNx2 assess patient.   4:36 PM, 10/10/19 Rosamaria Lints, PT, DPT Physical Therapist - [facility]  [phone] (ASCOM)   Mikayla Chiusano C 10/10/2019, 4:34 PM

## 2019-10-10 NOTE — Progress Notes (Signed)
PROGRESS NOTE  [name] TSV:[id number] DOB: 1935-05-20 DOA: 10/06/2019 [facility]: [facility]  Brief History   Douglas Mathis is a 84 y.o. male with medical history significant for diastolic congestive heart failure, hypertension, COPD chronically on 3 L of O2 who presents for a fall and altered mental status.  Patient was unable to provide any history due to sodium mental status.  History was obtained through the daughter Douglas Mathis who is his HCPOA.  She says that patient lives alone at a senior center and has neighbors that check on him.  Yesterday he sustained a fall in the bathroom and thinks that perhaps he tripped on his oxygen cord.  Then today he was found face down by EMS and they suspect that he fell because he tripped on his O2 cord.  Prior to that, daughter last saw him normal in the morning and states that he was still himself and making breakfast.  He does need to walk with a walker and is compliant with it.  She endorsed that he has been eating and drinking well since she buys his groceries.  He smokes cigarettes but does not drink any alcohol.  She says he was otherwise in his normal state of health and denies any signs of infection.  ED Course: He was afebrile, normotensive initially required up to 4 L via nasal cannula but was able to be turned back down to his home 3 L and remained at 99% oxygen saturation.  Lab work shows leukocytosis of 13.6 which appears to be chronic.  He also was hyponatremic down to 123 which he appears to have chronic hyponatremia around the 130s to high 120s.  Creatinine was normal at 0.67.  UA shows large leuk, negative nitrite and many WBC and bacteria.  CT head and CT cervical spine without any acute findings.  CT abdomen and pelvis showed nondisplaced T3 spinous process and right inferior articular facet fracture.  There is a probable acute superior compression fracture of the T4 vertebral body.  [facility] were  consulted to admit the patient for further evaluation and treatment.  The patient was admitted to a telemetry bed. The patient has passed a swallow eval. Chemistry will be followed.  Consultants  . None  Procedures  . None  Antibiotics   Anti-infectives (From admission, onward)   Start     Dose/Rate Route Frequency Ordered Stop   10/07/19 0000  cefTRIAXone (ROCEPHIN) 1 g in sodium chloride 0.9 % 100 mL IVPB     Discontinue     1 g 200 mL/hr over 30 Minutes Intravenous Every 24 hours 10/06/19 2349       Subjective  The patient is resting comfortably. No new complaints. Breathing is better.  Objective   Vitals:  Vitals:   10/10/19 1300 10/10/19 1400  BP:    Pulse:    Resp:    Temp:    SpO2: (!) 85% 95%   Exam:  Constitutional:  . The patient is awake, alert, and oriented x 3. No acute distress. Respiratory:  . No increased work of breathing. . No wheezes, rales, or rhonchi . No tactile fremitus Cardiovascular:  . Regular rate and rhythm . No murmurs, ectopy, or gallups. . No lateral PMI. No thrills. Abdomen:  . Abdomen is soft, non-tender, non-distended . No hernias, masses, or organomegaly . Normoactive bowel sounds.  Musculoskeletal:  . No cyanosis, clubbing, or edema Skin:  . No rashes, lesions, ulcers . palpation of skin:  no induration or nodules Neurologic:  . The patient is unable to cooperate with exam. Psychiatric: The patient is confused. Unable to evaluate due to inability to cooperate with exam.  I have personally reviewed the following:   Today's Data  . Vitals, BMP, Glucose  Imaging  . CXR . Pelvis x-ray  Cardiology Data  . EKG  Scheduled Meds: . amLODipine  10 mg Oral Daily  . carvedilol  6.25 mg Oral BID  . enoxaparin (LOVENOX) injection  40 mg Subcutaneous Q24H  . erythromycin   Both Eyes QHS  . ferrous sulfate  325 mg Oral BID WC  . fluticasone furoate-vilanterol  1 puff Inhalation Daily   And  . umeclidinium bromide  1 puff  Inhalation Daily  . lisinopril  5 mg Oral Daily  . mouth rinse  15 mL Mouth Rinse BID  . montelukast  10 mg Oral Daily  . pantoprazole  40 mg Oral Daily  . tamsulosin  0.4 mg Oral QPC breakfast   Continuous Infusions: . cefTRIAXone (ROCEPHIN)  IV 1 g (10/10/19 0112)    Principal Problem:   AMS (altered mental status) Active Problems:   COPD (chronic obstructive pulmonary disease) (HCC)   Chronic diastolic (congestive) heart failure (HCC)   HTN (hypertension)   Hyponatremia   Fall   Spinal fracture of T3 vertebra (HCC)   Acute lower UTI   Conjunctivitis   Sepsis (HCC)   LOS: 3 days   A & P   Acute metabolic encephalopathy: Improving. Unknown how much of his cognitive deficit is chronic. Hyponatremia is correcting. UTI is being treated with Rocephin.  UTI: UA shows large leuk, negative nitrite many WBC and bacteria. Start IV Rocephin for now with urine culture pending.  Hyponatremia: Resolving. 123 upon admission. 129 this morning.  Patient appears to have chronic hyponatremia in the past ranging from the high 120s to 130s. Urine osmolality and urine sodium lab suggest possible hypovolemic hyponatremia. Will stop IV fluids and monitor sodium.  Recurrent fall: Patient has had 2 falls in the past 2 days and appears to be mechanical from him tripping on his oxygen cord. PT has evaluated the patient and has recommended SNF placement. TOC has been consulted.  T3 spinous process, right inferior articulate facet fracture, and acute superior compression fracture of T4 vertebral body: The patient denies any pain. Per PT the patient will require SNF placement.  COPD with chronic hypoxemia on 3 L: Patient presented initially needing 4 L but was able to wean down to 3 L with good O2 sat Maintain O2 sat between 80% to 92%. He has wheezing but does not feel that he is in acute exacerbation. Continue bronchodilators.  Diastolic congestive heart failure: Appears euvolemic on exam.   Hold Lasix for now as he appears to be hypovolemic. Monitor volume status carefully.  Bilateral conjunctivitis: Patient with mild erythema and crusting around the eye. Improving with erythromycin ointment.  Essential Hypertension: Continue amlodipine, lisinopril and Coreg  I have seen and examined this patient myself. I have spent 30 minutes in his evaluation and care.  DVT prophylaxis:.Lovenox Code Status: Full Family Communication: None available. Disposition Plan:   Status is: Inpatient  The patient remains Inpatient appropriate and will d/c after 2 midnights.  Dispo: The patient is from: Home  Anticipated d/c is to: SNF  Anticipated d/c date is: 2 day  Patient currently is medically stable to d/c, but no safe discharge. SNF placement pending.  Shalla Bulluck, DO [facility] Direct contact:  see [email or web address]  7PM-7AM contact night coverage as above 10/10/2019, 1:31 PM  LOS: 0 days

## 2019-10-10 NOTE — [place] FL2 (Signed)
[place] MEDICAID FL2 LEVEL OF CARE SCREENING TOOL     IDENTIFICATION  Patient Name: Douglas Mathis Birthdate: [birth year] Sex: male Admission Date (Current Location): 10/06/2019  [place] and [place] Number:      Facility and Address:  [facility], [address], [place], [place] [address]      Provider Number:    Attending Physician Name and Address:  Fran Lowes, DO  Relative Name and Phone Number:  [name] [phone]    Current Level of Care: Hospital Recommended Level of Care: Skilled Nursing Facility Prior Approval Number:    Date Approved/Denied:   PASRR Number: [id number]  Discharge Plan: SNF    Current Diagnoses: Patient Active Problem List   Diagnosis Date Noted  . Spinal fracture of T3 vertebra (HCC) 10/07/2019  . Acute lower UTI 10/07/2019  . Conjunctivitis 10/07/2019  . Sepsis (HCC) 10/07/2019  . AMS (altered mental status) 10/06/2019  . Dehydration with hyponatremia   . COPD with acute bronchitis (HCC) 06/25/2019  . GERD (gastroesophageal reflux disease) 06/25/2019  . BPH (benign prostatic hyperplasia) 06/25/2019  . Hypotension   . Acute on chronic respiratory failure with hypercapnia (HCC)   . Hyponatremia   . Fall   . Generalized weakness   . Syncope   . Acute respiratory failure with hypoxia and hypercarbia (HCC) 06/03/2019  . Shock (HCC) 06/03/2019  . Chronic diastolic (congestive) heart failure (HCC) 03/31/2018  . HTN (hypertension) 03/31/2018  . Eyelid edema, left 03/31/2018  . COPD (chronic obstructive pulmonary disease) (HCC) 02/26/2018  . COPD exacerbation (HCC) 10/25/2017    Orientation RESPIRATION BLADDER Height & Weight     Self, Place, Situation  O2 (2L) External catheter Weight: 75.3 kg Height:  5\' 6"  (167.6 cm)  BEHAVIORAL SYMPTOMS/MOOD NEUROLOGICAL BOWEL NUTRITION STATUS      Continent Diet (Dysphagia 3)  AMBULATORY STATUS COMMUNICATION OF NEEDS Skin   Limited Assist Verbally Normal                        Personal Care Assistance Level of Assistance  Bathing, Feeding, Dressing Bathing Assistance: Limited assistance Feeding assistance: Independent Dressing Assistance: Limited assistance     Functional Limitations Info  Sight, Hearing, Speech Sight Info: Adequate Hearing Info: Adequate Speech Info: Adequate    SPECIAL CARE FACTORS FREQUENCY  PT (By licensed PT), OT (By licensed OT)                    Contractures Contractures Info: Not present    Additional Factors Info  Code Status, Allergies Code Status Info: DNR Allergies Info: No known allergies           Current Medications (10/10/2019):  This is the current hospital active medication list Current Facility-Administered Medications  Medication Dose Route Frequency Provider Last Rate Last Admin  . acetaminophen (TYLENOL) tablet 650 mg  650 mg Oral Q6H PRN 10/12/2019, NP   650 mg at 10/08/19 1618  . amLODipine (NORVASC) tablet 10 mg  10 mg Oral Daily Tu, Ching T, DO   10 mg at 10/10/19 10/12/19  . carvedilol (COREG) tablet 6.25 mg  6.25 mg Oral BID Tu, Ching T, DO   6.25 mg at 10/09/19 2251  . cefTRIAXone (ROCEPHIN) 1 g in sodium chloride 0.9 % 100 mL IVPB  1 g Intravenous Q24H Tu, Ching T, DO 200 mL/hr at 10/10/19 0112 1 g at 10/10/19 0112  . enoxaparin (LOVENOX) injection 40 mg  40 mg  Subcutaneous Q24H Tu, Ching T, DO   40 mg at 10/09/19 2251  . erythromycin ophthalmic ointment   Both Eyes QHS Tu, Ching T, DO   1 application at 10/09/19 2251  . ferrous sulfate tablet 325 mg  325 mg Oral BID WC Tu, Ching T, DO   325 mg at 10/10/19 0926  . fluticasone furoate-vilanterol (BREO ELLIPTA) 100-25 MCG/INH 1 puff  1 puff Inhalation Daily Tu, Ching T, DO   1 puff at 10/10/19 0927   And  . umeclidinium bromide (INCRUSE ELLIPTA) 62.5 MCG/INH 1 puff  1 puff Inhalation Daily Tu, Ching T, DO   1 puff at 10/10/19 0927  . ipratropium-albuterol (DUONEB) 0.5-2.5 (3) MG/3ML nebulizer solution 3 mL  3 mL  Nebulization Q4H PRN Tu, Ching T, DO      . lisinopril (ZESTRIL) tablet 5 mg  5 mg Oral Daily Tu, Ching T, DO   5 mg at 10/10/19 0926  . MEDLINE mouth rinse  15 mL Mouth Rinse BID Swayze, Ava, DO   15 mL at 10/10/19 0928  . montelukast (SINGULAIR) tablet 10 mg  10 mg Oral Daily Tu, Ching T, DO   10 mg at 10/10/19 1517  . pantoprazole (PROTONIX) EC tablet 40 mg  40 mg Oral Daily Tu, Ching T, DO   40 mg at 10/10/19 0926  . tamsulosin (FLOMAX) capsule 0.4 mg  0.4 mg Oral QPC breakfast Tu, Ching T, DO   0.4 mg at 10/10/19 0926  . traMADol (ULTRAM) tablet 50 mg  50 mg Oral Q6H PRN Manuela Schwartz, NP   50 mg at 10/10/19 0159     Discharge Medications: Please see discharge summary for a list of discharge medications.  Relevant Imaging Results:  Relevant Lab Results:   Additional Information [facility] [id number]  Trenton Founds, RN

## 2019-10-10 NOTE — TOC Initial Note (Signed)
Transition of Care [facility]) - Initial/Assessment Note    Patient Details  Name: Douglas Mathis MRN: [id number] Date of Birth: 12-May-1935  [facility] CM/SW Contact:    Trenton Founds, RN Phone Number: 10/10/2019, 3:19 PM  Clinical Narrative:  RNCM spoke with daughter [name] by phone to complete assessment. Patient is from home where he lives in an apartment. Corrie Dandy is his HCPOA, she reports that the last 2 times he has come into the hospital he has had to d/c to a rehab facility, she reports he went to [facility]. Corrie Dandy is in agreement with a bed search being initiated, she would prefer he return to [facility] but is agreeable to checking with [facility] and [facility] as well. RNCM verified PASSR, completed FL-2 and initiated bed search.                  Expected Discharge Plan: Skilled Nursing Facility Barriers to Discharge: No Barriers Identified   Patient Goals and CMS Choice        Expected Discharge Plan and Services Expected Discharge Plan: Skilled Nursing Facility     Post Acute Care Choice: Skilled Nursing Facility Living arrangements for the past 2 months: Apartment                                      Prior Living Arrangements/Services Living arrangements for the past 2 months: Apartment Lives with:: Self Patient language and need for interpreter reviewed:: Yes Do you feel safe going back to the place where you live?: Yes      Need for Family Participation in Patient Care: Yes (Comment) Care giver support system in place?: Yes (comment)   Criminal Activity/Legal Involvement Pertinent to Current Situation/Hospitalization: No - Comment as needed  Activities of Daily Living Home Assistive Devices/Equipment: None ADL Screening (condition at time of admission) Patient's cognitive ability adequate to safely complete daily activities?: No Is the patient deaf or have difficulty hearing?: No Does the patient have difficulty seeing,  even when wearing glasses/contacts?: Yes Does the patient have difficulty concentrating, remembering, or making decisions?: Yes Patient able to express need for assistance with ADLs?: Yes Does the patient have difficulty dressing or bathing?: Yes Independently performs ADLs?: Yes (appropriate for developmental age) Does the patient have difficulty walking or climbing stairs?: Yes Weakness of Legs: Both Weakness of Arms/Hands: Both  Permission Sought/Granted                  Emotional Assessment Appearance:: Appears stated age       Alcohol / Substance Use: Not Applicable Psych Involvement: No (comment)  Admission diagnosis:  Hyponatremia [E87.1] Fall [W19.XXXA] Closed fracture of transverse process of thoracic vertebra, initial encounter (HCC) [S22.009A] Conjunctivitis of both eyes, unspecified conjunctivitis type [H10.9] AMS (altered mental status) [R41.82] Compression fracture of T4 vertebra, initial encounter (HCC) [S22.040A] Sepsis (HCC) [A41.9] Patient Active Problem List   Diagnosis Date Noted  . Spinal fracture of T3 vertebra (HCC) 10/07/2019  . Acute lower UTI 10/07/2019  . Conjunctivitis 10/07/2019  . Sepsis (HCC) 10/07/2019  . AMS (altered mental status) 10/06/2019  . Dehydration with hyponatremia   . COPD with acute bronchitis (HCC) 06/25/2019  . GERD (gastroesophageal reflux disease) 06/25/2019  . BPH (benign prostatic hyperplasia) 06/25/2019  . Hypotension   . Acute on chronic respiratory failure with hypercapnia (HCC)   . Hyponatremia   . Fall   .  Generalized weakness   . Syncope   . Acute respiratory failure with hypoxia and hypercarbia (HCC) 06/03/2019  . Shock (HCC) 06/03/2019  . Chronic diastolic (congestive) heart failure (HCC) 03/31/2018  . HTN (hypertension) 03/31/2018  . Eyelid edema, left 03/31/2018  . COPD (chronic obstructive pulmonary disease) (HCC) 02/26/2018  . COPD exacerbation (HCC) 10/25/2017   PCP:  [facility]:   [facility] - [place], [place] - [place] - [address] [place] [address] Phone: [phone] Fax: [phone]  [facility] - [place], [place] - [address] [place] [address] Phone: [phone] Fax: [phone]     Social Determinants of Health (SDOH) Interventions    Readmission Risk Interventions No flowsheet data found.

## 2019-10-11 ENCOUNTER — Encounter: Payer: Self-pay | Admitting: Internal Medicine

## 2019-10-11 LAB — BASIC METABOLIC PANEL
Anion gap: 9 (ref 5–15)
BUN: 7 mg/dL — ABNORMAL LOW (ref 8–23)
CO2: 34 mmol/L — ABNORMAL HIGH (ref 22–32)
Calcium: 8.6 mg/dL — ABNORMAL LOW (ref 8.9–10.3)
Chloride: 86 mmol/L — ABNORMAL LOW (ref 98–111)
Creatinine, Ser: 0.67 mg/dL (ref 0.61–1.24)
GFR calc Af Amer: >60 mL/min (ref >60)
GFR calc non Af Amer: >60 mL/min (ref >60)
Glucose, Bld: 116 mg/dL — ABNORMAL HIGH (ref 70–99)
Potassium: 4.7 mmol/L (ref 3.5–5.1)
Sodium: 129 mmol/L — ABNORMAL LOW (ref 135–145)

## 2019-10-11 LAB — CBC WITH DIFFERENTIAL/PLATELET
Abs Immature Granulocytes: 0.13 10*3/uL — ABNORMAL HIGH (ref 0.00–0.07)
Basophils Absolute: 0.1 10*3/uL (ref 0.0–0.1)
Basophils Relative: 0 %
Eosinophils Absolute: 0 10*3/uL (ref 0.0–0.5)
Eosinophils Relative: 0 %
HCT: 26.8 % — ABNORMAL LOW (ref 39.0–52.0)
Hemoglobin: 8.3 g/dL — ABNORMAL LOW (ref 13.0–17.0)
Immature Granulocytes: 1 %
Lymphocytes Relative: 6 %
Lymphs Abs: 0.7 10*3/uL (ref 0.7–4.0)
MCH: 27.4 pg (ref 26.0–34.0)
MCHC: 31 g/dL (ref 30.0–36.0)
MCV: 88.4 fL (ref 80.0–100.0)
Monocytes Absolute: 1.4 10*3/uL — ABNORMAL HIGH (ref 0.1–1.0)
Monocytes Relative: 11 %
Neutro Abs: 10.2 10*3/uL — ABNORMAL HIGH (ref 1.7–7.7)
Neutrophils Relative %: 82 %
Platelets: 213 10*3/uL (ref 150–400)
RBC: 3.03 MIL/uL — ABNORMAL LOW (ref 4.22–5.81)
RDW: 17.4 % — ABNORMAL HIGH (ref 11.5–15.5)
WBC: 12.4 10*3/uL — ABNORMAL HIGH (ref 4.0–10.5)
nRBC: 0 % (ref 0.0–0.2)

## 2019-10-11 NOTE — Progress Notes (Addendum)
Progress Note    Douglas Mathis  LFY:[id number] DOB: [birth year]  DOA: 10/06/2019 PCP: [facility]      Brief Narrative:    Medical records reviewed and are as summarized below:  Douglas Mathis is a 84 y.o. male with medical history significant fordiastolic congestive heart failure, hypertension, COPD chronically on 3 L of O2 who presented to the hospital for a fall and altered mental status.      Assessment/Plan:   Principal Problem:   AMS (altered mental status) Active Problems:   COPD (chronic obstructive pulmonary disease) (HCC)   Chronic diastolic (congestive) heart failure (HCC)   HTN (hypertension)   Hyponatremia   Fall   Spinal fracture of T3 vertebra (HCC)   Acute lower UTI   Conjunctivitis   Sepsis (HCC)   UTI-urine culture did not show any growth.  Sepsis ruled out Acute metabolic encephalopathy Acute on chronic hyponatremia Recurrent falls at home T3 spinous process and right T3 inferior articulating facet fracture Acute compression fracture of T4 vertebral body COPD with chronic hypoxemic respiratory failure consultants as per minute oxygen Chronic diastolic CHF Bilateral conjunctivitis Hypertension   PLAN  Discontinue IV Rocephin Continue antihypertensives Continue bronchodilators for COPD Continue erythromycin for conjunctivitis    Body mass index is 26.79 kg/m.  Diet Order            DIET DYS 3 Room service appropriate? Yes with Assist; Fluid consistency: Thin  Diet effective now                     Medications:   . amLODipine  10 mg Oral Daily  . carvedilol  6.25 mg Oral BID  . enoxaparin (LOVENOX) injection  40 mg Subcutaneous Q24H  . erythromycin   Both Eyes QHS  . ferrous sulfate  325 mg Oral BID WC  . fluticasone furoate-vilanterol  1 puff Inhalation Daily   And  . umeclidinium bromide  1 puff Inhalation Daily  . lisinopril  5 mg Oral Daily  . mouth rinse  15 mL Mouth Rinse BID   . montelukast  10 mg Oral Daily  . pantoprazole  40 mg Oral Daily  . tamsulosin  0.4 mg Oral QPC breakfast   Continuous Infusions: . cefTRIAXone (ROCEPHIN)  IV 1 g (10/11/19 0046)     Anti-infectives (From admission, onward)   Start     Dose/Rate Route Frequency Ordered Stop   10/07/19 0000  cefTRIAXone (ROCEPHIN) 1 g in sodium chloride 0.9 % 100 mL IVPB     Discontinue     1 g 200 mL/hr over 30 Minutes Intravenous Every 24 hours 10/06/19 2349               Family Communication/Anticipated D/C date and plan/Code Status   DVT prophylaxis: enoxaparin (LOVENOX) injection 40 mg Start: 10/06/19 2330     Code Status: DNR  Family Communication:  Disposition Plan:    Status is: Inpatient  Remains inpatient appropriate because:Unsafe d/c plan and Inpatient level of care appropriate due to severity of illness   Dispo: The patient is from: Home              Anticipated d/c is to: SNF              Anticipated d/c date is: 2 days              Patient currently is not medically stable to d/c.  Subjective:   She is unable to provide a proper history.  Objective:    Vitals:   10/10/19 1538 10/10/19 2333 10/11/19 0738 10/11/19 1546  BP: (!) 134/50 (!) 107/44 (!) 129/44 (!) 133/50  Pulse: 84 68 66 84  Resp: 18 17 18 19   Temp: 98.1 F (36.7 C) 99 F (37.2 C) 98.4 F (36.9 C) 98.2 F (36.8 C)  TempSrc: Oral Oral Oral Oral  SpO2: 92% (!) 89% 93% 100%  Weight:      Height:       No data found.   Intake/Output Summary (Last 24 hours) at 10/11/2019 1735 Last data filed at 10/11/2019 1700 Gross per 24 hour  Intake 360 ml  Output 1250 ml  Net -890 ml   Filed Weights   10/06/19 1933  Weight: 75.3 kg    Exam:  GEN: NAD SKIN: Warm an dry EYES: EOMI ENT: MMM CV: RRR PULM: CTA B ABD: soft, ND, NT, +BS CNS: AAO x 1 (person), non focal EXT: No edema or tenderness   Data Reviewed:   I have personally reviewed following labs and imaging  studies:  Labs: Labs show the following:   Basic Metabolic Panel: Recent Labs  Lab 10/06/19 1939 10/06/19 1939 10/07/19 1407 10/07/19 1407 10/08/19 0503 10/08/19 0503 10/09/19 0334 10/09/19 0334 10/10/19 0448 10/11/19 0450  NA 123*   < > 128*  --  130*  --  128*  --  129* 129*  K 4.3   < > 3.8   < > 4.3   < > 4.2   < > 4.4 4.7  CL 79*   < > 85*  --  87*  --  86*  --  86* 86*  CO2 32   < > 34*  --  33*  --  31  --  33* 34*  GLUCOSE 143*   < > 86  --  64*  --  63*  --  158* 116*  BUN 20   < > 15  --  17  --  15  --  12 7*  CREATININE 0.67   < > 0.48*  --  0.64  --  0.72  --  0.57* 0.67  CALCIUM 8.8*   < > 8.1*  --  8.1*  --  8.4*  --  8.3* 8.6*  MG 1.9  --   --   --   --   --   --   --   --   --    < > = values in this interval not displayed.   GFR Estimated Creatinine Clearance: 64.2 mL/min (by C-G formula based on SCr of 0.67 mg/dL). Liver Function Tests: Recent Labs  Lab 10/06/19 1939  AST 39  ALT 24  ALKPHOS 56  BILITOT 0.8  PROT 7.5  ALBUMIN 4.1   No results for input(s): LIPASE, AMYLASE in the last 168 hours. Recent Labs  Lab 10/09/19 0334  AMMONIA 36*   Coagulation profile Recent Labs  Lab 10/06/19 1939  INR 1.0    CBC: Recent Labs  Lab 10/06/19 1939 10/08/19 0503 10/09/19 0334 10/11/19 0450  WBC 13.6* 9.8 10.4 12.4*  NEUTROABS  --  8.2*  --  10.2*  HGB 8.6* 7.9* 7.9* 8.3*  HCT 27.5* 25.4* 25.8* 26.8*  MCV 86.2 88.2 89.3 88.4  PLT 215 216 217 213   Cardiac Enzymes: No results for input(s): CKTOTAL, CKMB, CKMBINDEX, TROPONINI in the last 168 hours. BNP (last 3 results) No results for  input(s): PROBNP in the last 8760 hours. CBG: No results for input(s): GLUCAP in the last 168 hours. D-Dimer: No results for input(s): DDIMER in the last 72 hours. Hgb A1c: No results for input(s): HGBA1C in the last 72 hours. Lipid Profile: No results for input(s): CHOL, HDL, LDLCALC, TRIG, CHOLHDL, LDLDIRECT in the last 72 hours. Thyroid function  studies: No results for input(s): TSH, T4TOTAL, T3FREE, THYROIDAB in the last 72 hours.  Invalid input(s): FREET3 Anemia work up: No results for input(s): VITAMINB12, FOLATE, FERRITIN, TIBC, IRON, RETICCTPCT in the last 72 hours. Sepsis Labs: Recent Labs  Lab 10/06/19 1939 10/06/19 1941 10/08/19 0503 10/09/19 0334 10/11/19 0450  WBC 13.6*  --  9.8 10.4 12.4*  LATICACIDVEN  --  1.0  --   --   --     Microbiology Recent Results (from the past 240 hour(s))  SARS Coronavirus 2 by RT PCR (hospital order, performed in [facility] hospital lab) Nasopharyngeal Nasopharyngeal Swab     Status: None   Collection Time: 10/06/19  7:25 PM   Specimen: Nasopharyngeal Swab  Result Value Ref Range Status   SARS Coronavirus 2 NEGATIVE NEGATIVE Final    Comment: (NOTE) SARS-CoV-2 target nucleic acids are NOT DETECTED.  The SARS-CoV-2 RNA is generally detectable in upper and lower respiratory specimens during the acute phase of infection. The lowest concentration of SARS-CoV-2 viral copies this assay can detect is 250 copies / mL. A negative result does not preclude SARS-CoV-2 infection and should not be used as the sole basis for treatment or other patient management decisions.  A negative result may occur with improper specimen collection / handling, submission of specimen other than nasopharyngeal swab, presence of viral mutation(s) within the areas targeted by this assay, and inadequate number of viral copies (<250 copies / mL). A negative result must be combined with clinical observations, patient history, and epidemiological information.  Fact Sheet for Patients:   BoilerBrush.com.cy  Fact Sheet for Healthcare Providers: https://pope.com/  This test is not yet approved or  cleared by the [place] FDA and has been authorized for detection and/or diagnosis of SARS-CoV-2 by FDA under an Emergency Use Authorization (EUA).  This EUA will  remain in effect (meaning this test can be used) for the duration of the COVID-19 declaration under Section 564(b)(1) of the Act, 21 U.S.C. section 360bbb-3(b)(1), unless the authorization is terminated or revoked sooner.  Performed at [facility], [address], [place], [place] [address]   Urine Culture     Status: None   Collection Time: 10/09/19 12:10 PM   Specimen: Urine, Random  Result Value Ref Range Status   Specimen Description   Final    URINE, RANDOM Performed at [facility], [address], [place], [place] [address]    Special Requests   Final    NONE Performed at [facility], [address], [place], [place] [address]    Culture   Final    NO GROWTH Performed at [facility], [address] [place], [place] [address]    Report Status 10/10/2019 FINAL  Final    Procedures and diagnostic studies:  No results found.             LOS: 4 days   Kyvon Hu  [facility]   Pager on [email or web address]. If 7PM-7AM, please contact night-coverage at [email or web address]     10/11/2019, 5:35 PM

## 2019-10-11 NOTE — Progress Notes (Signed)
PT Cancellation Note  Patient Details Name: Douglas Mathis MRN: [id number] DOB: December 18, 1935   Cancelled Treatment:     PT attempt. Pt asleep upon arriving. He easily awakes however was unwilling to participate. Pt very lethargic and drifts back to sleep quickly. Given max encouragement to participate without success. Therapist did call daughter to discuss equipment needs and discuss POC. Pt unwilling to go to SNF at DC. He will benefit from having WC and BSC for safety when he returns home + HHPT to continue to progress pt with safe functional mobility. Therapist will return next date to continue efforts to progress pt and encourage OOB activity.    Rushie Chestnut 10/11/2019, 3:00 PM

## 2019-10-11 NOTE — Progress Notes (Addendum)
Speech Language Pathology Treatment: Dysphagia  Patient Details Name: Douglas Mathis MRN: [id number] DOB: 1935-05-10 Today's Date: 10/11/2019 Time: 5462-7035 SLP Time Calculation (min) (ACUTE ONLY): 40 min  Assessment / Plan / Recommendation Clinical Impression  Pt seen for ongoing assessment of swallowing and toleration of diet; education re: swallowing, diet consistency rec'd, and general aspiration precautions including recommendation for safer swallowing of Pills by use of a PUREE -- for all Pill swallowing. Unsure of pt's Baseline Cognitive status; he requires verbal cues for follow through w/ tasks. NSG and pt reported toleration of the current mech soft diet w/ Meats MINCED w/ Gravy to moisten. Pt likes his "hot coffee" too.   Discussed w/ pt the current diet consistency including use of MINCED meats, moistened cooked foods for ease of mastication and overall intake especially when missing most Dentition. Pt consumed trials of thin liquids via Cup and softened solids w/ no overt clinical s/s of aspiration w/ oral intake -- no decline in vocal quality during verbalizations, no decline in respiratory status post intake. Oral phase grossly Douglas Mathis w/ trial consistencies given; oral clearing achieved given Time and alternating sips/bites. Pt is missing most Dentition which impacts effective mastication of solids. Education and discussion w/ pt on reducing risks for aspiration by following general aspiration precautions to include Small sips Slowly, No straws if increased coughing noted, Sit Fully Upright w/ all oral intake, and gumming/mashing foods well b/f swallowing. Recommended reducing distractions including Talking at meals. Also discussed food consistencies and options, preparation, and use of condiments to soften foods also. Encouragement given for ALL Pills to be swallowed using a Puree for safer swallowing. Handouts given.  Recommend continue a Dysphagia level 3 (MINCED meats) diet w/ Thin  liquids via Cup; aspiration precautions; Rreflux precautions; Pills in a Puree for safer swallowing. ST services will sign off at this time as pt appears at his Baseline w/ oropharyngeal phase swallowing; Education completed w/ pt, handouts. NSG to reconsult if new needs arise while admitted. Pt and NSG agreed.    HPI HPI: Pt is a 84 y.o.malewith medical history significant fordiastolic congestive heart failure, hypertension, COPD chronically on 3 L of O2 who presents for a fall and altered mental status. History was obtained through the daughter Douglas Mathis who is his HCPOA.  She says that patient lives alone at a senior center and has neighbors that check on him.  Yesterday he sustained a fall in the bathroom and thinks that perhaps he tripped on his oxygen cord.  Then today he was found face down by EMS and they suspect that he fell because he tripped on his O2 cord.  Prior to that, daughter last saw him normal in the morning and states that he was still himself and making breakfast.  He does need to walk with a walker and is compliant with it.  She endorsed that he has been eating and drinking well since she buys his groceries.  He smokes cigarettes but does not drink any alcohol.  She says he was otherwise in his normal state of health and denies any signs of infection. Pt admitted w/ UTI and Acute metabolic encephalopathy. CXR: "No acute cardiopulmonary process, Emphysema".       SLP Plan  All goals met       Recommendations  Diet recommendations: Dysphagia 3 (mechanical soft);Dysphagia 2 (fine chop);Thin liquid (meats minced w/ gravies) Liquids provided via: Cup Medication Administration: Whole meds with puree (for ease of swallowing) Supervision: Patient able to self  feed;Staff to assist with self feeding;Intermittent supervision to cue for compensatory strategies Compensations: Slow rate;Minimize environmental distractions;Small sips/bites;Lingual sweep for clearance of pocketing;Follow  solids with liquid Postural Changes and/or Swallow Maneuvers: Seated upright 90 degrees;Upright 30-60 min after meal;Out of bed for meals                General recommendations:  (Dietician f/u as needed) Oral Care Recommendations: Oral care BID;Oral care before and after PO;Staff/trained caregiver to provide oral care Follow up Recommendations: None SLP Visit Diagnosis: Dysphagia, unspecified (R13.10) Plan: All goals met       GO                 Douglas Kenner, MS, CCC-SLP Douglas Mathis 10/11/2019, 3:54 PM

## 2019-10-11 NOTE — Care Management Important Message (Signed)
Important Message  Patient Details  Name: Douglas Mathis MRN: [id number] Date of Birth: 1935/12/11   Medicare Important Message Given:  Yes     Bernadette Hoit 10/11/2019, 11:41 AM

## 2019-10-12 DIAGNOSIS — R410 Disorientation, unspecified: Secondary | ICD-10-CM

## 2019-10-12 LAB — CBC WITH DIFFERENTIAL/PLATELET
Abs Immature Granulocytes: 0.13 10*3/uL — ABNORMAL HIGH (ref 0.00–0.07)
Basophils Absolute: 0.1 10*3/uL (ref 0.0–0.1)
Basophils Relative: 0 %
Eosinophils Absolute: 0 10*3/uL (ref 0.0–0.5)
Eosinophils Relative: 0 %
HCT: 27.5 % — ABNORMAL LOW (ref 39.0–52.0)
Hemoglobin: 8.9 g/dL — ABNORMAL LOW (ref 13.0–17.0)
Immature Granulocytes: 1 %
Lymphocytes Relative: 4 %
Lymphs Abs: 0.6 10*3/uL — ABNORMAL LOW (ref 0.7–4.0)
MCH: 28.2 pg (ref 26.0–34.0)
MCHC: 32.4 g/dL (ref 30.0–36.0)
MCV: 87 fL (ref 80.0–100.0)
Monocytes Absolute: 1.4 10*3/uL — ABNORMAL HIGH (ref 0.1–1.0)
Monocytes Relative: 10 %
Neutro Abs: 12.4 10*3/uL — ABNORMAL HIGH (ref 1.7–7.7)
Neutrophils Relative %: 85 %
Platelets: 200 10*3/uL (ref 150–400)
RBC: 3.16 MIL/uL — ABNORMAL LOW (ref 4.22–5.81)
RDW: 17.5 % — ABNORMAL HIGH (ref 11.5–15.5)
WBC: 14.5 10*3/uL — ABNORMAL HIGH (ref 4.0–10.5)
nRBC: 0 % (ref 0.0–0.2)

## 2019-10-12 LAB — BASIC METABOLIC PANEL
Anion gap: 10 (ref 5–15)
BUN: 11 mg/dL (ref 8–23)
CO2: 35 mmol/L — ABNORMAL HIGH (ref 22–32)
Calcium: 8.7 mg/dL — ABNORMAL LOW (ref 8.9–10.3)
Chloride: 85 mmol/L — ABNORMAL LOW (ref 98–111)
Creatinine, Ser: 0.74 mg/dL (ref 0.61–1.24)
GFR calc Af Amer: >60 mL/min (ref >60)
GFR calc non Af Amer: >60 mL/min (ref >60)
Glucose, Bld: 167 mg/dL — ABNORMAL HIGH (ref 70–99)
Potassium: 5.3 mmol/L — ABNORMAL HIGH (ref 3.5–5.1)
Sodium: 130 mmol/L — ABNORMAL LOW (ref 135–145)

## 2019-10-12 MED ORDER — SODIUM ZIRCONIUM CYCLOSILICATE 5 G PO PACK
5.0000 g | PACK | Freq: Once | ORAL | Status: AC
  Administered 2019-10-12: 5 g via ORAL
  Filled 2019-10-12: qty 1

## 2019-10-12 MED ORDER — CARVEDILOL 12.5 MG PO TABS
12.5000 mg | ORAL_TABLET | Freq: Two times a day (BID) | ORAL | Status: DC
  Administered 2019-10-12 – 2019-10-16 (×7): 12.5 mg via ORAL
  Filled 2019-10-12 (×8): qty 1

## 2019-10-12 NOTE — Progress Notes (Signed)
Patient suffers from Weakness which impairs their ability to perform daily activities like ADL's in the home.  A walker will not resolve issue with performing activities of daily living. A wheelchair will allow patient to safely perform daily activities. Patient is not able to propel themselves in the home using a standard weight wheelchair due to weakness. Patient can self propel in the lightweight wheelchair.

## 2019-10-12 NOTE — Progress Notes (Addendum)
Progress Note    Douglas Mathis  QAS:[id number] DOB: [birth year]  DOA: 10/06/2019 PCP: [facility]      Brief Narrative:    Medical records reviewed and are as summarized below:  Douglas Mathis is a 84 y.o. male with medical history significant fordiastolic congestive heart failure, hypertension, COPD chronically on 3 L of O2 who presented to the hospital for a fall and altered mental status.      Assessment/Plan:   Principal Problem:   AMS (altered mental status) Active Problems:   COPD (chronic obstructive pulmonary disease) (HCC)   Chronic diastolic (congestive) heart failure (HCC)   HTN (hypertension)   Hyponatremia   Fall   Spinal fracture of T3 vertebra (HCC)   Acute lower UTI   Conjunctivitis   Sepsis (HCC)   UTI-urine culture did not show any growth.  Sepsis ruled out Acute metabolic encephalopathy-improving Acute on chronic hyponatremia-sodium is at baseline Recurrent falls at home T3 spinous process and right T3 inferior articulating facet fracture Acute compression fracture of T4 vertebral body COPD with chronic hypoxemic respiratory failure on 3L/min oxygen Chronic diastolic CHF Bilateral conjunctivitis Hypertension Hyperkalemia Leukocytosis   PLAN  Discontinue IV Rocephin Treat hyperkalemia with Lokelma.  Discontinue lisinopril. Continue carvedilol (increased dose to 12.5 mg twice daily) and amlodipine Continue bronchodilators for COPD Monitor CBC and BMP Continue erythromycin for conjunctivitis Plan to discharge home with home health tomorrow. Plan of care was discussed with his daughter, Corrie Dandy.    Body mass index is 26.79 kg/m.  Diet Order            DIET DYS 3 Room service appropriate? Yes with Assist; Fluid consistency: Thin  Diet effective now                     Medications:   . amLODipine  10 mg Oral Daily  . carvedilol  12.5 mg Oral BID  . enoxaparin (LOVENOX) injection  40 mg  Subcutaneous Q24H  . erythromycin   Both Eyes QHS  . ferrous sulfate  325 mg Oral BID WC  . fluticasone furoate-vilanterol  1 puff Inhalation Daily   And  . umeclidinium bromide  1 puff Inhalation Daily  . mouth rinse  15 mL Mouth Rinse BID  . montelukast  10 mg Oral Daily  . pantoprazole  40 mg Oral Daily  . tamsulosin  0.4 mg Oral QPC breakfast   Continuous Infusions:    Anti-infectives (From admission, onward)   Start     Dose/Rate Route Frequency Ordered Stop   10/07/19 0000  cefTRIAXone (ROCEPHIN) 1 g in sodium chloride 0.9 % 100 mL IVPB  Status:  Discontinued        1 g 200 mL/hr over 30 Minutes Intravenous Every 24 hours 10/06/19 2349 10/12/19 1132             Family Communication/Anticipated D/C date and plan/Code Status   DVT prophylaxis: enoxaparin (LOVENOX) injection 40 mg Start: 10/06/19 2330     Code Status: DNR  Family Communication: Plan discussed with his daughter, Corrie Dandy. Disposition Plan:    Status is: Inpatient  Remains inpatient appropriate because:Unsafe d/c plan and Inpatient level of care appropriate due to severity of illness   Dispo: The patient is from: Home              Anticipated d/c is to: SNF              Anticipated d/c  date is: 2 days              Patient currently is not medically stable to d/c.           Subjective:   He is unable to provide an adequate history.  He has no complaints.  Objective:    Vitals:   10/11/19 0738 10/11/19 1546 10/11/19 2350 10/12/19 0725  BP: (!) 129/44 (!) 133/50 (!) 126/54 134/78  Pulse: 66 84 66 66  Resp: 18 19 16 19   Temp: 98.4 F (36.9 C) 98.2 F (36.8 C) 98.5 F (36.9 C) 98.9 F (37.2 C)  TempSrc: Oral Oral Oral Oral  SpO2: 93% 100% (!) 89% 97%  Weight:      Height:       No data found.   Intake/Output Summary (Last 24 hours) at 10/12/2019 1247 Last data filed at 10/12/2019 10/14/2019 Gross per 24 hour  Intake 0 ml  Output 950 ml  Net -950 ml   Filed Weights    10/06/19 1933  Weight: 75.3 kg    Exam:  GEN: NAD SKIN: Warm an dry EYES: No pallor or icterus ENT: MMM CV: RRR PULM: No wheezing or rales heard ABD: soft, ND, NT, +BS CNS: AAO x 1 (person), non focal EXT: No edema or tenderness   Data Reviewed:   I have personally reviewed following labs and imaging studies:  Labs: Labs show the following:   Basic Metabolic Panel: Recent Labs  Lab 10/06/19 1939 10/07/19 1407 10/08/19 0503 10/08/19 0503 10/09/19 0334 10/09/19 0334 10/10/19 0448 10/10/19 0448 10/11/19 0450 10/12/19 1002  NA 123*   < > 130*  --  128*  --  129*  --  129* 130*  K 4.3   < > 4.3   < > 4.2   < > 4.4   < > 4.7 5.3*  CL 79*   < > 87*  --  86*  --  86*  --  86* 85*  CO2 32   < > 33*  --  31  --  33*  --  34* 35*  GLUCOSE 143*   < > 64*  --  63*  --  158*  --  116* 167*  BUN 20   < > 17  --  15  --  12  --  7* 11  CREATININE 0.67   < > 0.64  --  0.72  --  0.57*  --  0.67 0.74  CALCIUM 8.8*   < > 8.1*  --  8.4*  --  8.3*  --  8.6* 8.7*  MG 1.9  --   --   --   --   --   --   --   --   --    < > = values in this interval not displayed.   GFR Estimated Creatinine Clearance: 64.2 mL/min (by C-G formula based on SCr of 0.74 mg/dL). Liver Function Tests: Recent Labs  Lab 10/06/19 1939  AST 39  ALT 24  ALKPHOS 56  BILITOT 0.8  PROT 7.5  ALBUMIN 4.1   No results for input(s): LIPASE, AMYLASE in the last 168 hours. Recent Labs  Lab 10/09/19 0334  AMMONIA 36*   Coagulation profile Recent Labs  Lab 10/06/19 1939  INR 1.0    CBC: Recent Labs  Lab 10/06/19 1939 10/08/19 0503 10/09/19 0334 10/11/19 0450 10/12/19 1002  WBC 13.6* 9.8 10.4 12.4* 14.5*  NEUTROABS  --  8.2*  --  10.2*  12.4*  HGB 8.6* 7.9* 7.9* 8.3* 8.9*  HCT 27.5* 25.4* 25.8* 26.8* 27.5*  MCV 86.2 88.2 89.3 88.4 87.0  PLT 215 216 217 213 200   Cardiac Enzymes: No results for input(s): CKTOTAL, CKMB, CKMBINDEX, TROPONINI in the last 168 hours. BNP (last 3 results) No  results for input(s): PROBNP in the last 8760 hours. CBG: No results for input(s): GLUCAP in the last 168 hours. D-Dimer: No results for input(s): DDIMER in the last 72 hours. Hgb A1c: No results for input(s): HGBA1C in the last 72 hours. Lipid Profile: No results for input(s): CHOL, HDL, LDLCALC, TRIG, CHOLHDL, LDLDIRECT in the last 72 hours. Thyroid function studies: No results for input(s): TSH, T4TOTAL, T3FREE, THYROIDAB in the last 72 hours.  Invalid input(s): FREET3 Anemia work up: No results for input(s): VITAMINB12, FOLATE, FERRITIN, TIBC, IRON, RETICCTPCT in the last 72 hours. Sepsis Labs: Recent Labs  Lab 10/06/19 1939 10/06/19 1941 10/08/19 0503 10/09/19 0334 10/11/19 0450 10/12/19 1002  WBC   < >  --  9.8 10.4 12.4* 14.5*  LATICACIDVEN  --  1.0  --   --   --   --    < > = values in this interval not displayed.    Microbiology Recent Results (from the past 240 hour(s))  SARS Coronavirus 2 by RT PCR (hospital order, performed in [facility] hospital lab) Nasopharyngeal Nasopharyngeal Swab     Status: None   Collection Time: 10/06/19  7:25 PM   Specimen: Nasopharyngeal Swab  Result Value Ref Range Status   SARS Coronavirus 2 NEGATIVE NEGATIVE Final    Comment: (NOTE) SARS-CoV-2 target nucleic acids are NOT DETECTED.  The SARS-CoV-2 RNA is generally detectable in upper and lower respiratory specimens during the acute phase of infection. The lowest concentration of SARS-CoV-2 viral copies this assay can detect is 250 copies / mL. A negative result does not preclude SARS-CoV-2 infection and should not be used as the sole basis for treatment or other patient management decisions.  A negative result may occur with improper specimen collection / handling, submission of specimen other than nasopharyngeal swab, presence of viral mutation(s) within the areas targeted by this assay, and inadequate number of viral copies (<250 copies / mL). A negative result must be  combined with clinical observations, patient history, and epidemiological information.  Fact Sheet for Patients:   BoilerBrush.com.cy  Fact Sheet for Healthcare Providers: https://pope.com/  This test is not yet approved or  cleared by the [place] FDA and has been authorized for detection and/or diagnosis of SARS-CoV-2 by FDA under an Emergency Use Authorization (EUA).  This EUA will remain in effect (meaning this test can be used) for the duration of the COVID-19 declaration under Section 564(b)(1) of the Act, 21 U.S.C. section 360bbb-3(b)(1), unless the authorization is terminated or revoked sooner.  Performed at [facility], [address], [place], [place] [address]   Urine Culture     Status: None   Collection Time: 10/09/19 12:10 PM   Specimen: Urine, Random  Result Value Ref Range Status   Specimen Description   Final    URINE, RANDOM Performed at [facility], [address] [place], [place] [address]    Special Requests   Final    NONE Performed at [facility], [address], [place], [place] [address]    Culture   Final    NO GROWTH Performed at [facility], [address], [place], [place] [address]  Report Status 10/10/2019 FINAL  Final    Procedures and diagnostic studies:  No results found.             LOS: 5 days   Javel Hersh  [facility]   Pager on [email or web address]. If 7PM-7AM, please contact night-coverage at [email or web address]     10/12/2019, 12:47 PM

## 2019-10-12 NOTE — Progress Notes (Signed)
Physical Therapy Treatment Patient Details Name: Douglas Mathis MRN: [id number] DOB: 1935/09/03 Today's Date: 10/12/2019    History of Present Illness 84 y.o. male with medical history significant for diastolic congestive heart failure, hypertension, COPD chronically on 3 L of O2 who presents for a fall and altered mental status. Recurrent fall with 2 falls in the past 2 days prior to admission, appears to be mechanical tripping on oxygen cord. T 3 spinous process right inferior articulate facet fracture, acute superior compression fracture of T4 vertebral body    PT Comments    Patient sleeping on arrival to room but easily arousable to voice.  Patient declined PT initially but agreeable to participate with encouragement. Patient needs supervision for bed mobility. Min assistance required for standing with cues for technique. Patient agreeable to ambulate only a short distance with rolling walker and Min guard for safety. Cues for technique and safety with mobility throughout. Sp02 94% on 2 L after getting to chair. Patient is fatigued with activity. Patient participated in LE strengthening  therapeutic exercises in bed. Patient's daughter at bedside for part of PT session. Daughter reports SNF is not an option financially and she plans for patient to discharge home. Recommend HHPT and family supervision/assistance for mobility for safety and fall prevention if SNF is not an option. Wheelchair would be appropriate due to limited standing endurance for ambulation. Recommend to continue PT to maximize function and safety.    Follow Up Recommendations   (SNF recommended, however daughter states he will go home ) Recommend HHPT and family supervision/assistance with mobility      Equipment Recommendations  Wheelchair (measurements PT)    Recommendations for Other Services       Precautions / Restrictions Precautions Precautions: Fall Restrictions Weight Bearing Restrictions: No     Mobility  Bed Mobility Overal bed mobility: Needs Assistance Bed Mobility: Supine to Sit     Supine to sit: Supervision     General bed mobility comments: increased time required to complete tasks. cues for task initation and technique   Transfers Overall transfer level: Needs assistance Equipment used: Rolling walker (2 wheeled) Transfers: Sit to/from Stand Sit to Stand: Min assist         General transfer comment: lifting assistance required for standing from bed. cues for technique. patient with decreased eccentric control with sitting.   Ambulation/Gait Ambulation/Gait assistance: Min guard Gait Distance (Feet): 3 Feet Assistive device: Rolling walker (2 wheeled)   Gait velocity: decreased   General Gait Details: Min guard for safety and cues for technique using rolling walker for fall prevention. Patient declined ambulaing further today. Sp02 94 % after seated rest break.    Stairs             Wheelchair Mobility    Modified Rankin (Stroke Patients Only)       Balance Overall balance assessment: Needs assistance Sitting-balance support: Feet supported Sitting balance-Leahy Scale: Fair     Standing balance support: Bilateral upper extremity supported Standing balance-Leahy Scale: Fair Standing balance comment: patient relying on rolling walker for support in standing position                             Cognition Arousal/Alertness: Awake/alert Behavior During Therapy: WFL for tasks assessed/performed Overall Cognitive Status: Within Functional Limits for tasks assessed  Exercises General Exercises - Lower Extremity Heel Slides: AAROM;Strengthening;Both;10 reps;Supine Hip ABduction/ADduction: AAROM;Strengthening;Both;10 reps;Supine Straight Leg Raises: AAROM;Strengthening;Both;10 reps;Supine Other Exercises Other Exercises: exercises performed 2 sets of 10 reps each with  verbal cues for technique     General Comments        Pertinent Vitals/Pain Pain Assessment: No/denies pain    Home Living                      Prior Function            PT Goals (current goals can now be found in the care plan section) Acute Rehab PT Goals Patient Stated Goal: to go home  PT Goal Formulation: With patient Time For Goal Achievement: 10/22/19 Potential to Achieve Goals: Fair Progress towards PT goals: Progressing toward goals    Frequency    Min 2X/week      PT Plan Equipment recommendations need to be updated    Co-evaluation              AM-PAC PT "6 Clicks" Mobility   Outcome Measure  Help needed turning from your back to your side while in a flat bed without using bedrails?: A Little Help needed moving from lying on your back to sitting on the side of a flat bed without using bedrails?: A Little Help needed moving to and from a bed to a chair (including a wheelchair)?: A Little Help needed standing up from a chair using your arms (e.g., wheelchair or bedside chair)?: A Little Help needed to walk in hospital room?: A Little Help needed climbing 3-5 steps with a railing? : A Lot 6 Click Score: 17    End of Session Equipment Utilized During Treatment: Gait belt;Oxygen Activity Tolerance: Patient limited by fatigue Patient left: in chair;with call bell/phone within reach;with chair alarm set Nurse Communication: Mobility status PT Visit Diagnosis: Unsteadiness on feet (R26.81);Muscle weakness (generalized) (M62.81);Repeated falls (R29.6)     Time: 2956-2130 PT Time Calculation (min) (ACUTE ONLY): 34 min  Charges:  $Therapeutic Exercise: 8-22 mins $Therapeutic Activity: 8-22 mins                     Donna Bernard, PT, MPT    Ina Homes 10/12/2019, 11:43 AM

## 2019-10-13 ENCOUNTER — Encounter: Payer: Self-pay | Admitting: Internal Medicine

## 2019-10-13 ENCOUNTER — Inpatient Hospital Stay: Payer: Medicare Other

## 2019-10-13 LAB — CBC WITH DIFFERENTIAL/PLATELET
Abs Immature Granulocytes: 0.13 10*3/uL — ABNORMAL HIGH (ref 0.00–0.07)
Basophils Absolute: 0.1 10*3/uL (ref 0.0–0.1)
Basophils Relative: 1 %
Eosinophils Absolute: 0 10*3/uL (ref 0.0–0.5)
Eosinophils Relative: 0 %
HCT: 27.1 % — ABNORMAL LOW (ref 39.0–52.0)
Hemoglobin: 8.7 g/dL — ABNORMAL LOW (ref 13.0–17.0)
Immature Granulocytes: 1 %
Lymphocytes Relative: 6 %
Lymphs Abs: 0.7 10*3/uL (ref 0.7–4.0)
MCH: 27.5 pg (ref 26.0–34.0)
MCHC: 32.1 g/dL (ref 30.0–36.0)
MCV: 85.8 fL (ref 80.0–100.0)
Monocytes Absolute: 1.1 10*3/uL — ABNORMAL HIGH (ref 0.1–1.0)
Monocytes Relative: 10 %
Neutro Abs: 9 10*3/uL — ABNORMAL HIGH (ref 1.7–7.7)
Neutrophils Relative %: 82 %
Platelets: 217 10*3/uL (ref 150–400)
RBC: 3.16 MIL/uL — ABNORMAL LOW (ref 4.22–5.81)
RDW: 17.5 % — ABNORMAL HIGH (ref 11.5–15.5)
WBC: 10.9 10*3/uL — ABNORMAL HIGH (ref 4.0–10.5)
nRBC: 0 % (ref 0.0–0.2)

## 2019-10-13 LAB — BASIC METABOLIC PANEL
Anion gap: 9 (ref 5–15)
BUN: 11 mg/dL (ref 8–23)
CO2: 35 mmol/L — ABNORMAL HIGH (ref 22–32)
Calcium: 8.4 mg/dL — ABNORMAL LOW (ref 8.9–10.3)
Chloride: 82 mmol/L — ABNORMAL LOW (ref 98–111)
Creatinine, Ser: 0.6 mg/dL — ABNORMAL LOW (ref 0.61–1.24)
GFR calc Af Amer: >60 mL/min (ref >60)
GFR calc non Af Amer: >60 mL/min (ref >60)
Glucose, Bld: 130 mg/dL — ABNORMAL HIGH (ref 70–99)
Potassium: 5.1 mmol/L (ref 3.5–5.1)
Sodium: 126 mmol/L — ABNORMAL LOW (ref 135–145)

## 2019-10-13 MED ORDER — BISACODYL 10 MG RE SUPP
10.0000 mg | Freq: Once | RECTAL | Status: AC
  Administered 2019-10-13: 10 mg via RECTAL
  Filled 2019-10-13: qty 1

## 2019-10-13 MED ORDER — SENNOSIDES-DOCUSATE SODIUM 8.6-50 MG PO TABS
1.0000 | ORAL_TABLET | Freq: Once | ORAL | Status: AC
  Administered 2019-10-13: 1 via ORAL
  Filled 2019-10-13: qty 1

## 2019-10-13 MED ORDER — IOHEXOL 350 MG/ML SOLN
75.0000 mL | Freq: Once | INTRAVENOUS | Status: AC | PRN
  Administered 2019-10-13: 75 mL via INTRAVENOUS

## 2019-10-13 NOTE — Progress Notes (Signed)
This Clinical research associate called rapid response patient was standing to get into w/c and appeared to have Vagal episode, assist x 2 to the bed. Dylan , RN placed patient on hi-flow , his o2 before was 56% , and came up to 98% on hi-flow per RT Bambi , patient is ok to stay on 5LPM. He is at 90%    10/13/19 1215  Vitals  Temp 98.1 F (36.7 C)  Temp Source Oral  BP (!) 160/61  MAP (mmHg) 89  BP Location Left Arm  BP Method Automatic  Patient Position (if appropriate) Lying  Pulse Rate 60  Resp 16  MEWS COLOR  MEWS Score Color Green  Oxygen Therapy  SpO2 90 %  O2 Device Nasal Cannula  MEWS Score  MEWS Temp 0  MEWS Systolic 0  MEWS Pulse 0  MEWS RR 0  MEWS LOC 0  MEWS Score 0

## 2019-10-13 NOTE — Progress Notes (Signed)
pATIENTS BP LYING, UTA BP STANDING    10/13/19 1814  Vitals  BP (!) 123/50  MAP (mmHg) 73  BP Location Left Arm  BP Method Automatic  Patient Position (if appropriate) Lying  Pulse Rate 68  Pulse Rate Source Monitor  Resp 17  MEWS COLOR  MEWS Score Color Green  Oxygen Therapy  SpO2 96 %  O2 Device Nasal Cannula  MEWS Score  MEWS Temp 0  MEWS Systolic 0  MEWS Pulse 0  MEWS RR 0  MEWS LOC 0  MEWS Score 0

## 2019-10-13 NOTE — Progress Notes (Signed)
Progress Note    Douglas Mathis  Douglas Mathis DOB: 1935-10-16  DOA: 10/06/2019 PCP: [facility]      Brief Narrative:    Medical records reviewed and are as summarized below:  Douglas Mathis is a 84 y.o. male with medical history significant fordiastolic congestive heart failure, hypertension, COPD chronically on 3 L of O2 who presented to the hospital for a fall and altered mental status.      Assessment/Plan:   Principal Problem:   AMS (altered mental status) Active Problems:   COPD (chronic obstructive pulmonary disease) (HCC)   Chronic diastolic (congestive) heart failure (HCC)   HTN (hypertension)   Hyponatremia   Fall   Spinal fracture of T3 vertebra (HCC)   Acute lower UTI   Conjunctivitis   Sepsis (HCC)   S/p syncope, likely vasovagal Acute on chronic hypoxemic respiratory failure UTI-urine culture did not show any growth.  Sepsis ruled out Acute metabolic encephalopathy-improving Acute on chronic hyponatremia-sodium slightly worse today Recurrent falls at home T3 spinous process and right T3 inferior articulating facet fracture Acute compression fracture of T4 vertebral body COPD with chronic hypoxemic respiratory failure on 3 L/min  Oxygen Chronic diastolic CHF Bilateral conjunctivitis Hypertension Hyperkalemia-improved Leukocytosis-improved   PLAN  CTA of the chest was done today and there was no evidence of PE or acute abnormality EKG done today did not show any acute abnormality.  It showed NSR, LAD, RBBB and PACs. Ordered orthostatic vital signs Continue carvedilol and amlodipine Continue bronchodilators for COPD Monitor CBC and BMP Continue erythromycin for conjunctivitis Discharge was canceled today because of syncopal episode and worsening hypoxia. Plan to discharge home tomorrow Plan of care was discussed with his daughter, Douglas Mathis, at the bedside.    Body mass index is 26.79 kg/m.  Diet Order             Diet - low sodium heart healthy           DIET DYS 3           DIET DYS 3 Room service appropriate? Yes with Assist; Fluid consistency: Thin  Diet effective now                     Medications:   . amLODipine  10 mg Oral Daily  . carvedilol  12.5 mg Oral BID  . enoxaparin (LOVENOX) injection  40 mg Subcutaneous Q24H  . erythromycin   Both Eyes QHS  . ferrous sulfate  325 mg Oral BID WC  . fluticasone furoate-vilanterol  1 puff Inhalation Daily   And  . umeclidinium bromide  1 puff Inhalation Daily  . mouth rinse  15 mL Mouth Rinse BID  . montelukast  10 mg Oral Daily  . pantoprazole  40 mg Oral Daily  . tamsulosin  0.4 mg Oral QPC breakfast   Continuous Infusions:    Anti-infectives (From admission, onward)   Start     Dose/Rate Route Frequency Ordered Stop   10/07/19 0000  cefTRIAXone (ROCEPHIN) 1 g in sodium chloride 0.9 % 100 mL IVPB  Status:  Discontinued        1 g 200 mL/hr over 30 Minutes Intravenous Every 24 hours 10/06/19 2349 10/12/19 1132             Family Communication/Anticipated D/C date and plan/Code Status   DVT prophylaxis: enoxaparin (LOVENOX) injection 40 mg Start: 10/06/19 2330     Code Status: DNR  Family Communication:  Plan discussed with his daughter, Douglas Mathis. Disposition Plan:    Status is: Inpatient  Remains inpatient appropriate because:Unsafe d/c plan and Inpatient level of care appropriate due to severity of illness   Dispo: The patient is from: Home              Anticipated d/c is to: SNF              Anticipated d/c date is: 2 days              Patient currently is not medically stable to d/c.           Subjective:   Patient was feeling well this morning and was going to be discharged home today.  According to his nurse, when he got him out of bed into chair, he passed out and oxygen saturation apparently dropped into the 50s and he was bradycardic with heart rate between 30 to 60.  He was placed  back into the bed.  When I went back to see him at the bedside, he was oriented and was able to answer my questions appropriately.   Objective:    Vitals:   10/12/19 1539 10/13/19 0005 10/13/19 0846 10/13/19 1215  BP: (!) 144/54 (!) 113/96 (!) 129/54 (!) 160/61  Pulse: 70 62 73 60  Resp: 18 20 18 16   Temp: 98 F (36.7 C) 98.1 F (36.7 C) 98.1 F (36.7 C) 98.1 F (36.7 C)  TempSrc: Oral Oral Oral Oral  SpO2: 93% 100% 95% 90%  Weight:      Height:       No data found.   Intake/Output Summary (Last 24 hours) at 10/13/2019 1700 Last data filed at 10/13/2019 1029 Gross per 24 hour  Intake 480 ml  Output 650 ml  Net -170 ml   Filed Weights   10/06/19 1933  Weight: 75.3 kg    Exam:  GEN: No acute distress SKIN: Warm an dry EYES: No pallor or icterus ENT: MMM CV: Regular rate and rhythm PULM: Clear to auscultation bilaterally ABD: soft, ND, NT, +BS CNS: AAO x 1 (person), non focal EXT: No edema or tenderness   Data Reviewed:   I have personally reviewed following labs and imaging studies:  Labs: Labs show the following:   Basic Metabolic Panel: Recent Labs  Lab 10/06/19 1939 10/07/19 1407 10/09/19 0334 10/09/19 0334 10/10/19 0448 10/10/19 0448 10/11/19 0450 10/11/19 0450 10/12/19 1002 10/13/19 0808  NA 123*   < > 128*  --  129*  --  129*  --  130* 126*  K 4.3   < > 4.2   < > 4.4   < > 4.7   < > 5.3* 5.1  CL 79*   < > 86*  --  86*  --  86*  --  85* 82*  CO2 32   < > 31  --  33*  --  34*  --  35* 35*  GLUCOSE 143*   < > 63*  --  158*  --  116*  --  167* 130*  BUN 20   < > 15  --  12  --  7*  --  11 11  CREATININE 0.67   < > 0.72  --  0.57*  --  0.67  --  0.74 0.60*  CALCIUM 8.8*   < > 8.4*  --  8.3*  --  8.6*  --  8.7* 8.4*  MG 1.9  --   --   --   --   --   --   --   --   --    < > =  values in this interval not displayed.   GFR Estimated Creatinine Clearance: 64.2 mL/min (A) (by C-G formula based on SCr of 0.6 mg/dL (L)). Liver Function  Tests: Recent Labs  Lab 10/06/19 1939  AST 39  ALT 24  ALKPHOS 56  BILITOT 0.8  PROT 7.5  ALBUMIN 4.1   No results for input(s): LIPASE, AMYLASE in the last 168 hours. Recent Labs  Lab 10/09/19 0334  AMMONIA 36*   Coagulation profile Recent Labs  Lab 10/06/19 1939  INR 1.0    CBC: Recent Labs  Lab 10/08/19 0503 10/09/19 0334 10/11/19 0450 10/12/19 1002 10/13/19 0808  WBC 9.8 10.4 12.4* 14.5* 10.9*  NEUTROABS 8.2*  --  10.2* 12.4* 9.0*  HGB 7.9* 7.9* 8.3* 8.9* 8.7*  HCT 25.4* 25.8* 26.8* 27.5* 27.1*  MCV 88.2 89.3 88.4 87.0 85.8  PLT 216 217 213 200 217   Cardiac Enzymes: No results for input(s): CKTOTAL, CKMB, CKMBINDEX, TROPONINI in the last 168 hours. BNP (last 3 results) No results for input(s): PROBNP in the last 8760 hours. CBG: No results for input(s): GLUCAP in the last 168 hours. D-Dimer: No results for input(s): DDIMER in the last 72 hours. Hgb A1c: No results for input(s): HGBA1C in the last 72 hours. Lipid Profile: No results for input(s): CHOL, HDL, LDLCALC, TRIG, CHOLHDL, LDLDIRECT in the last 72 hours. Thyroid function studies: No results for input(s): TSH, T4TOTAL, T3FREE, THYROIDAB in the last 72 hours.  Invalid input(s): FREET3 Anemia work up: No results for input(s): VITAMINB12, FOLATE, FERRITIN, TIBC, IRON, RETICCTPCT in the last 72 hours. Sepsis Labs: Recent Labs  Lab 10/06/19 1941 10/08/19 0503 10/09/19 0334 10/11/19 0450 10/12/19 1002 10/13/19 0808  WBC  --    < > 10.4 12.4* 14.5* 10.9*  LATICACIDVEN 1.0  --   --   --   --   --    < > = values in this interval not displayed.    Microbiology Recent Results (from the past 240 hour(s))  SARS Coronavirus 2 by RT PCR (hospital order, performed in [facility] hospital lab) Nasopharyngeal Nasopharyngeal Swab     Status: None   Collection Time: 10/06/19  7:25 PM   Specimen: Nasopharyngeal Swab  Result Value Ref Range Status   SARS Coronavirus 2 NEGATIVE NEGATIVE Final     Comment: (NOTE) SARS-CoV-2 target nucleic acids are NOT DETECTED.  The SARS-CoV-2 RNA is generally detectable in upper and lower respiratory specimens during the acute phase of infection. The lowest concentration of SARS-CoV-2 viral copies this assay can detect is 250 copies / mL. A negative result does not preclude SARS-CoV-2 infection and should not be used as the sole basis for treatment or other patient management decisions.  A negative result may occur with improper specimen collection / handling, submission of specimen other than nasopharyngeal swab, presence of viral mutation(s) within the areas targeted by this assay, and inadequate number of viral copies (<250 copies / mL). A negative result must be combined with clinical observations, patient history, and epidemiological information.  Fact Sheet for Patients:   BoilerBrush.com.cy  Fact Sheet for Healthcare Providers: https://pope.com/  This test is not yet approved or  cleared by the [place] FDA and has been authorized for detection and/or diagnosis of SARS-CoV-2 by FDA under an Emergency Use Authorization (EUA).  This EUA will remain in effect (meaning this test can be used) for the duration of the COVID-19 declaration under Section 564(b)(1) of the Act, 21 U.S.C. section 360bbb-3(b)(1), unless the authorization is  terminated or revoked sooner.  Performed at [facility], [address] [place], [place] [address]   Urine Culture     Status: None   Collection Time: 10/09/19 12:10 PM   Specimen: Urine, Random  Result Value Ref Range Status   Specimen Description   Final    URINE, RANDOM Performed at [facility], [address], [place], [place] [address]    Special Requests   Final    NONE Performed at [facility], [address], [place], [place] [address]    Culture   Final    NO GROWTH Performed at [facility],  [address], [place], [place] [address]    Report Status 10/10/2019 FINAL  Final    Procedures and diagnostic studies:  CT ANGIO CHEST PE W OR WO CONTRAST  Result Date: 10/13/2019 CLINICAL DATA:  Syncope, acute hypoxia EXAM: CT ANGIOGRAPHY CHEST WITH CONTRAST TECHNIQUE: Multidetector CT imaging of the chest was performed using the standard protocol during bolus administration of intravenous contrast. Multiplanar CT image reconstructions and MIPs were obtained to evaluate the vascular anatomy. CONTRAST:  92mL OMNIPAQUE IOHEXOL 350 MG/ML SOLN COMPARISON:  CT chest 10/06/2019 FINDINGS: Cardiovascular: Satisfactory opacification of the pulmonary arteries to the segmental level. No evidence of pulmonary embolism. Normal heart size. No pericardial effusion. Thoracic aortic atherosclerosis. Multi vessel coronary artery atherosclerosis. Mediastinum/Nodes: No enlarged mediastinal, hilar, or axillary lymph nodes. Thyroid gland, trachea, and esophagus demonstrate no significant findings. Lungs/Pleura: Bilateral small partially loculated pleural effusions. Bibasilar airspace disease likely reflecting dependent atelectasis. No focal consolidation. No pneumothorax. There is bilateral centrilobular emphysema. Upper Abdomen: Partially visualized pneumobilia. No acute abdominal or pelvic pathology. Musculoskeletal: No acute osseous abnormality. No aggressive osseous lesion. T4 vertebral body compression fracture with approximately 50% anterior height loss similar to the prior examination of 10/13/2019. Review of the MIP images confirms the above findings. IMPRESSION: 1. No evidence of pulmonary embolus. 2. New bilateral small partially loculated pleural effusions with bibasilar airspace disease likely reflecting dependent atelectasis. 3. T4 vertebral body compression fracture with approximately 50% anterior height loss similar to the prior examination of 10/13/2019. 4. Aortic Atherosclerosis (ICD10-I70.0) and Emphysema  (ICD10-J43.9). Electronically Signed   By: Elige Ko   On: 10/13/2019 14:47               LOS: 6 days   Danijela Vessey  [facility]   Pager on [email or web address]. If 7PM-7AM, please contact night-coverage at [email or web address]     10/13/2019, 5:00 PM

## 2019-10-13 NOTE — TOC Progression Note (Signed)
Transition of Care [facility]) - Progression Note    Patient Details  Name: Douglas Mathis MRN: [id number] Date of Birth: 07-25-1935  Transition of Care [facility]) CM/SW Contact  Trenton Founds, RN Phone Number: 10/13/2019, 10:57 AM  Clinical Narrative:   RNCM spoke with patient's daughter Corrie Dandy by telephone. RNCM has reached out to both [employer] and [employer] and at this time neither one has a standard size wheel chair. Discussed continuing search but Corrie Dandy reports she will check to see if she can locate on locally. RNCM will remain available for needs.     Expected Discharge Plan: Skilled Nursing Facility Barriers to Discharge: No Barriers Identified  Expected Discharge Plan and Services Expected Discharge Plan: Skilled Nursing Facility     Post Acute Care Choice: Skilled Nursing Facility Living arrangements for the past 2 months: Apartment Expected Discharge Date: 10/13/19                                     Social Determinants of Health (SDOH) Interventions    Readmission Risk Interventions No flowsheet data found.

## 2019-10-13 NOTE — Progress Notes (Signed)
pATIENTS BP WHILE SITTING    10/13/19 1811  Vitals  BP 133/69  MAP (mmHg) 84  BP Location Left Arm  BP Method Automatic  Patient Position (if appropriate) Sitting  Pulse Rate 79  Pulse Rate Source Monitor  Resp 16  MEWS COLOR  MEWS Score Color Green  Oxygen Therapy  SpO2 98 %  O2 Device Nasal Cannula  MEWS Score  MEWS Temp 0  MEWS Systolic 0  MEWS Pulse 0  MEWS RR 0  MEWS LOC 0  MEWS Score 0

## 2019-10-13 NOTE — Care Management Important Message (Signed)
Important Message  Patient Details  Name: Douglas Mathis MRN: [id number] Date of Birth: [birth year]   Medicare Important Message Given:  Yes     [name] 10/13/2019, 11:29 AM

## 2019-10-13 NOTE — Plan of Care (Signed)
  Problem: Education: Goal: Knowledge of General Education information will improve Description: Including pain rating scale, medication(s)/side effects and non-pharmacologic comfort measures Outcome: Progressing   Problem: Health Behavior/Discharge Planning: Goal: Ability to manage health-related needs will improve Outcome: Progressing   Problem: Clinical Measurements: Goal: Ability to maintain clinical measurements within normal limits will improve Outcome: Progressing Goal: Will remain free from infection Outcome: Progressing Goal: Diagnostic test results will improve Outcome: Progressing Goal: Respiratory complications will improve Outcome: Progressing Goal: Cardiovascular complication will be avoided Outcome: Progressing   Problem: Nutrition: Goal: Adequate nutrition will be maintained Outcome: Progressing   Problem: Coping: Goal: Level of anxiety will decrease Outcome: Progressing   Problem: Elimination: Goal: Will not experience complications related to bowel motility Outcome: Not Progressing Goal: Will not experience complications related to urinary retention Outcome: Progressing   Problem: Pain Managment: Goal: General experience of comfort will improve Outcome: Progressing   Problem: Safety: Goal: Ability to remain free from injury will improve Outcome: Progressing   Problem: Skin Integrity: Goal: Risk for impaired skin integrity will decrease Outcome: Progressing

## 2019-10-13 NOTE — Progress Notes (Signed)
Ch arrived at room in response to RR. Pt was being stabilized. Pt's daughter arrived during visit. Ch let Douglas Mathis know to ask for Ch to RN when further support is needed. Douglas Mathis tearful and anxious at this time. Ch will follow-up later.

## 2019-10-13 NOTE — Progress Notes (Signed)
Rapid Response Event Note   Reason for Call:  Vasovagal Episode, hypoxia, AMS   Initial Focused Assessment:   Upon exam, patient is obtunded, SPO2 59%, HR variable 30s-60s.   Interventions:   - Patient placed on NRB. - HOB elevated. - MD paged to bedside.  Plan of Care:   - Previously scheduled discharge is postponed. - Patient placed on telemetry. - CXR ordered by MD. - SPO2 goal 90%. - Patient remains on 5L [place].   Event Summary:   Patient stabilized.  MD Notified: Dr. Myriam Forehand Call Time: 1159 hrs Arrival Time: 1202 hrs. End Time: 1220 hrs.  Rosana Fret, RN  Follow up:  Patient remains on [place] O2  With SPO2 @ 90%. No need to transfer at this time.

## 2019-10-14 LAB — CBC WITH DIFFERENTIAL/PLATELET
Abs Immature Granulocytes: 0.12 10*3/uL — ABNORMAL HIGH (ref 0.00–0.07)
Basophils Absolute: 0.1 10*3/uL (ref 0.0–0.1)
Basophils Relative: 0 %
Eosinophils Absolute: 0 10*3/uL (ref 0.0–0.5)
Eosinophils Relative: 0 %
HCT: 27.3 % — ABNORMAL LOW (ref 39.0–52.0)
Hemoglobin: 8.8 g/dL — ABNORMAL LOW (ref 13.0–17.0)
Immature Granulocytes: 1 %
Lymphocytes Relative: 6 %
Lymphs Abs: 0.7 10*3/uL (ref 0.7–4.0)
MCH: 27.6 pg (ref 26.0–34.0)
MCHC: 32.2 g/dL (ref 30.0–36.0)
MCV: 85.6 fL (ref 80.0–100.0)
Monocytes Absolute: 1.2 10*3/uL — ABNORMAL HIGH (ref 0.1–1.0)
Monocytes Relative: 11 %
Neutro Abs: 9.7 10*3/uL — ABNORMAL HIGH (ref 1.7–7.7)
Neutrophils Relative %: 82 %
Platelets: 231 10*3/uL (ref 150–400)
RBC: 3.19 MIL/uL — ABNORMAL LOW (ref 4.22–5.81)
RDW: 17.3 % — ABNORMAL HIGH (ref 11.5–15.5)
WBC: 11.8 10*3/uL — ABNORMAL HIGH (ref 4.0–10.5)
nRBC: 0 % (ref 0.0–0.2)

## 2019-10-14 LAB — BASIC METABOLIC PANEL
Anion gap: 10 (ref 5–15)
BUN: 12 mg/dL (ref 8–23)
CO2: 37 mmol/L — ABNORMAL HIGH (ref 22–32)
Calcium: 8.8 mg/dL — ABNORMAL LOW (ref 8.9–10.3)
Chloride: 83 mmol/L — ABNORMAL LOW (ref 98–111)
Creatinine, Ser: 0.66 mg/dL (ref 0.61–1.24)
GFR calc Af Amer: >60 mL/min (ref >60)
GFR calc non Af Amer: >60 mL/min (ref >60)
Glucose, Bld: 129 mg/dL — ABNORMAL HIGH (ref 70–99)
Potassium: 5.7 mmol/L — ABNORMAL HIGH (ref 3.5–5.1)
Sodium: 130 mmol/L — ABNORMAL LOW (ref 135–145)

## 2019-10-14 LAB — POTASSIUM: Potassium: 4.8 mmol/L (ref 3.5–5.1)

## 2019-10-14 MED ORDER — FUROSEMIDE 40 MG PO TABS
40.0000 mg | ORAL_TABLET | Freq: Two times a day (BID) | ORAL | Status: DC
  Administered 2019-10-14: 40 mg via ORAL
  Filled 2019-10-14: qty 1

## 2019-10-14 MED ORDER — FUROSEMIDE 40 MG PO TABS: 40.0000 mg | ORAL_TABLET | Freq: Two times a day (BID) | ORAL | Status: DC

## 2019-10-14 MED ORDER — SODIUM ZIRCONIUM CYCLOSILICATE 10 G PO PACK
10.0000 g | PACK | Freq: Once | ORAL | Status: AC
  Administered 2019-10-14: 10 g via ORAL
  Filled 2019-10-14: qty 1

## 2019-10-14 MED ORDER — FUROSEMIDE 10 MG/ML IJ SOLN: 20.0000 mg | Freq: Once | INTRAMUSCULAR | Status: DC

## 2019-10-14 MED ORDER — FUROSEMIDE 10 MG/ML IJ SOLN
40.0000 mg | Freq: Once | INTRAMUSCULAR | Status: DC
  Filled 2019-10-14: qty 4

## 2019-10-14 NOTE — Plan of Care (Signed)

## 2019-10-14 NOTE — Progress Notes (Signed)
I went back to check on the patient because chart review showed that oxygen saturation was 94% on 6 L oxygen per minute via nasal cannula.  He looks stable and he is not in any respiratory distress. Bebe Shaggy, RN and I checked his oxygen saturation and it was 97% on 3 L/min oxygen via nasal cannula, which is his baseline.  Corrie Dandy, her daughter, has been notified of discharge plan for today.

## 2019-10-14 NOTE — Progress Notes (Signed)
Pt's leg hurting, appears restless. RN asking Attending if there is something for restless legs to give. Pt SpO2 94% on 6 L [place]. Pt denies any further needs at this time, call bell within reach

## 2019-10-14 NOTE — Progress Notes (Signed)
Pt taken off nonrebreather and titrated down to 6 L [place]. SpO2 98%

## 2019-10-14 NOTE — Progress Notes (Signed)
Progress Note    Douglas Mathis  JKD:[id number] DOB: [birth year]  DOA: 10/06/2019 PCP: [facility]      Brief Narrative:    Medical records reviewed and are as summarized below:  Douglas Mathis is a 84 y.o. male with medical history significant fordiastolic congestive heart failure, hypertension, COPD chronically on 3 L of O2 who presented to the hospital for a fall and altered mental status.      Assessment/Plan:   Principal Problem:   AMS (altered mental status) Active Problems:   COPD (chronic obstructive pulmonary disease) (HCC)   Chronic diastolic (congestive) heart failure (HCC)   HTN (hypertension)   Hyponatremia   Fall   Spinal fracture of T3 vertebra (HCC)   Acute lower UTI   Conjunctivitis   Sepsis (HCC)   S/p syncope, likely vasovagal Acute on chronic hypoxemic respiratory failure UTI-urine culture did not show any growth.  Sepsis ruled out Acute metabolic encephalopathy-improving Acute on chronic hyponatremia-sodium slightly worse today Recurrent falls at home T3 spinous process and right T3 inferior articulating facet fracture Acute compression fracture of T4 vertebral body COPD with chronic hypoxemic respiratory failure on 3 L/min  Oxygen Chronic diastolic CHF Bilateral conjunctivitis Hypertension Hyperkalemia Leukocytosis-improved   PLAN  Patient has been taking home Lasix for the past 6 days.  Give 40 mg of IV Lasix today and resume oral Lasix tomorrow. Patient was given Lokelma 10 g x 1 dose today for hyperkalemia and hyperkalemia has improved. Continue antihypertensives Continue bronchodilators Discharge was canceled today because reportedly, when his daughter came to pick him up, she did not bring the patient's oxygen tank.  According to his nurse, oxygen saturation dropped to 65% and he became short of breath when he got up to walk.  Unfortunately, patient cannot go home without having his oxygen on  because of chronic hypoxemic respiratory failure. Plan to discharge home tomorrow. Plan of care was discussed with his daughter.    Body mass index is 26.79 kg/m.  Diet Order            Diet - low sodium heart healthy           DIET DYS 3           DIET DYS 3 Room service appropriate? Yes with Assist; Fluid consistency: Thin  Diet effective now                     Medications:   . amLODipine  10 mg Oral Daily  . carvedilol  12.5 mg Oral BID  . enoxaparin (LOVENOX) injection  40 mg Subcutaneous Q24H  . erythromycin   Both Eyes QHS  . ferrous sulfate  325 mg Oral BID WC  . fluticasone furoate-vilanterol  1 puff Inhalation Daily   And  . umeclidinium bromide  1 puff Inhalation Daily  . furosemide  40 mg Intravenous Once  . [START ON 10/15/2019] furosemide  40 mg Oral BID  . mouth rinse  15 mL Mouth Rinse BID  . montelukast  10 mg Oral Daily  . pantoprazole  40 mg Oral Daily  . tamsulosin  0.4 mg Oral QPC breakfast   Continuous Infusions:    Anti-infectives (From admission, onward)   Start     Dose/Rate Route Frequency Ordered Stop   10/07/19 0000  cefTRIAXone (ROCEPHIN) 1 g in sodium chloride 0.9 % 100 mL IVPB  Status:  Discontinued        1  g 200 mL/hr over 30 Minutes Intravenous Every 24 hours 10/06/19 2349 10/12/19 1132             Family Communication/Anticipated D/C date and plan/Code Status   DVT prophylaxis: enoxaparin (LOVENOX) injection 40 mg Start: 10/06/19 2330     Code Status: DNR  Family Communication: Plan discussed with his daughter, Corrie Dandy. Disposition Plan:    Status is: Inpatient  Remains inpatient appropriate because:Unsafe d/c plan and Inpatient level of care appropriate due to severity of illness   Dispo: The patient is from: Home              Anticipated d/c is to: Home              Anticipated d/c date is: 1 day              Patient currently is not medically stable to d/c.           Subjective:   Interval  events noted.  No shortness of breath or chest pain.   Objective:    Vitals:   10/14/19 0237 10/14/19 0825 10/14/19 1534 10/14/19 1622  BP: (!) 135/56 (!) 142/55 (!) 114/45   Pulse: 62 71 63   Resp: 16 20 16    Temp: 97.6 F (36.4 C) 98.2 F (36.8 C) 98.1 F (36.7 C)   TempSrc: Oral Oral Oral   SpO2: (!) 83% 100% 91% 97%  Weight:      Height:       No data found.   Intake/Output Summary (Last 24 hours) at 10/14/2019 1911 Last data filed at 10/14/2019 0451 Gross per 24 hour  Intake --  Output 100 ml  Net -100 ml   Filed Weights   10/06/19 1933  Weight: 75.3 kg    Exam:  GEN: No acute distress SKIN: Warm and dry EYES: Anicteric ENT: MMM CV: Regular rate and rhythm, no murmur start PULM: bibasilar rales, no wheezing ABD: soft, ND, NT, +BS CNS: Alert and oriented to person.  No focal deficits EXT: No tenderness or edema   Data Reviewed:   I have personally reviewed following labs and imaging studies:  Labs: Labs show the following:   Basic Metabolic Panel: Recent Labs  Lab 10/10/19 0448 10/10/19 0448 10/11/19 0450 10/11/19 0450 10/12/19 1002 10/12/19 1002 10/13/19 0808 10/13/19 0808 10/14/19 0543 10/14/19 1525  NA 129*  --  129*  --  130*  --  126*  --  130*  --   K 4.4   < > 4.7   < > 5.3*   < > 5.1   < > 5.7* 4.8  CL 86*  --  86*  --  85*  --  82*  --  83*  --   CO2 33*  --  34*  --  35*  --  35*  --  37*  --   GLUCOSE 158*  --  116*  --  167*  --  130*  --  129*  --   BUN 12  --  7*  --  11  --  11  --  12  --   CREATININE 0.57*  --  0.67  --  0.74  --  0.60*  --  0.66  --   CALCIUM 8.3*  --  8.6*  --  8.7*  --  8.4*  --  8.8*  --    < > = values in this interval not displayed.   GFR Estimated Creatinine Clearance: 64.2  mL/min (by C-G formula based on SCr of 0.66 mg/dL). Liver Function Tests: No results for input(s): AST, ALT, ALKPHOS, BILITOT, PROT, ALBUMIN in the last 168 hours. No results for input(s): LIPASE, AMYLASE in the last 168  hours. Recent Labs  Lab 10/09/19 0334  AMMONIA 36*   Coagulation profile No results for input(s): INR, PROTIME in the last 168 hours.  CBC: Recent Labs  Lab 10/08/19 0503 10/08/19 0503 10/09/19 0334 10/11/19 0450 10/12/19 1002 10/13/19 0808 10/14/19 0543  WBC 9.8   < > 10.4 12.4* 14.5* 10.9* 11.8*  NEUTROABS 8.2*  --   --  10.2* 12.4* 9.0* 9.7*  HGB 7.9*   < > 7.9* 8.3* 8.9* 8.7* 8.8*  HCT 25.4*   < > 25.8* 26.8* 27.5* 27.1* 27.3*  MCV 88.2   < > 89.3 88.4 87.0 85.8 85.6  PLT 216   < > 217 213 200 217 231   < > = values in this interval not displayed.   Cardiac Enzymes: No results for input(s): CKTOTAL, CKMB, CKMBINDEX, TROPONINI in the last 168 hours. BNP (last 3 results) No results for input(s): PROBNP in the last 8760 hours. CBG: No results for input(s): GLUCAP in the last 168 hours. D-Dimer: No results for input(s): DDIMER in the last 72 hours. Hgb A1c: No results for input(s): HGBA1C in the last 72 hours. Lipid Profile: No results for input(s): CHOL, HDL, LDLCALC, TRIG, CHOLHDL, LDLDIRECT in the last 72 hours. Thyroid function studies: No results for input(s): TSH, T4TOTAL, T3FREE, THYROIDAB in the last 72 hours.  Invalid input(s): FREET3 Anemia work up: No results for input(s): VITAMINB12, FOLATE, FERRITIN, TIBC, IRON, RETICCTPCT in the last 72 hours. Sepsis Labs: Recent Labs  Lab 10/11/19 0450 10/12/19 1002 10/13/19 0808 10/14/19 0543  WBC 12.4* 14.5* 10.9* 11.8*    Microbiology Recent Results (from the past 240 hour(s))  SARS Coronavirus 2 by RT PCR (hospital order, performed in [facility] hospital lab) Nasopharyngeal Nasopharyngeal Swab     Status: None   Collection Time: 10/06/19  7:25 PM   Specimen: Nasopharyngeal Swab  Result Value Ref Range Status   SARS Coronavirus 2 NEGATIVE NEGATIVE Final    Comment: (NOTE) SARS-CoV-2 target nucleic acids are NOT DETECTED.  The SARS-CoV-2 RNA is generally detectable in upper and lower respiratory  specimens during the acute phase of infection. The lowest concentration of SARS-CoV-2 viral copies this assay can detect is 250 copies / mL. A negative result does not preclude SARS-CoV-2 infection and should not be used as the sole basis for treatment or other patient management decisions.  A negative result may occur with improper specimen collection / handling, submission of specimen other than nasopharyngeal swab, presence of viral mutation(s) within the areas targeted by this assay, and inadequate number of viral copies (<250 copies / mL). A negative result must be combined with clinical observations, patient history, and epidemiological information.  Fact Sheet for Patients:   BoilerBrush.com.cy  Fact Sheet for Healthcare Providers: https://pope.com/  This test is not yet approved or  cleared by the [place] FDA and has been authorized for detection and/or diagnosis of SARS-CoV-2 by FDA under an Emergency Use Authorization (EUA).  This EUA will remain in effect (meaning this test can be used) for the duration of the COVID-19 declaration under Section 564(b)(1) of the Act, 21 U.S.C. section 360bbb-3(b)(1), unless the authorization is terminated or revoked sooner.  Performed at [facility], [address], [place], [place] [address]   Urine Culture  Status: None   Collection Time: 10/09/19 12:10 PM   Specimen: Urine, Random  Result Value Ref Range Status   Specimen Description   Final    URINE, RANDOM Performed at [facility], [address], [place], [place] [address]    Special Requests   Final    NONE Performed at [facility], [address], [place], [place] [address]    Culture   Final    NO GROWTH Performed at [facility], [address], [place], [place] [address]    Report Status 10/10/2019 FINAL  Final    Procedures and diagnostic studies:  CT ANGIO CHEST PE W  OR WO CONTRAST  Result Date: 10/13/2019 CLINICAL DATA:  Syncope, acute hypoxia EXAM: CT ANGIOGRAPHY CHEST WITH CONTRAST TECHNIQUE: Multidetector CT imaging of the chest was performed using the standard protocol during bolus administration of intravenous contrast. Multiplanar CT image reconstructions and MIPs were obtained to evaluate the vascular anatomy. CONTRAST:  59mL OMNIPAQUE IOHEXOL 350 MG/ML SOLN COMPARISON:  CT chest 10/06/2019 FINDINGS: Cardiovascular: Satisfactory opacification of the pulmonary arteries to the segmental level. No evidence of pulmonary embolism. Normal heart size. No pericardial effusion. Thoracic aortic atherosclerosis. Multi vessel coronary artery atherosclerosis. Mediastinum/Nodes: No enlarged mediastinal, hilar, or axillary lymph nodes. Thyroid gland, trachea, and esophagus demonstrate no significant findings. Lungs/Pleura: Bilateral small partially loculated pleural effusions. Bibasilar airspace disease likely reflecting dependent atelectasis. No focal consolidation. No pneumothorax. There is bilateral centrilobular emphysema. Upper Abdomen: Partially visualized pneumobilia. No acute abdominal or pelvic pathology. Musculoskeletal: No acute osseous abnormality. No aggressive osseous lesion. T4 vertebral body compression fracture with approximately 50% anterior height loss similar to the prior examination of 10/13/2019. Review of the MIP images confirms the above findings. IMPRESSION: 1. No evidence of pulmonary embolus. 2. New bilateral small partially loculated pleural effusions with bibasilar airspace disease likely reflecting dependent atelectasis. 3. T4 vertebral body compression fracture with approximately 50% anterior height loss similar to the prior examination of 10/13/2019. 4. Aortic Atherosclerosis (ICD10-I70.0) and Emphysema (ICD10-J43.9). Electronically Signed   By: Elige Ko   On: 10/13/2019 14:47               LOS: 7 days   Sicily Zaragoza  [facility]   Pager on [email or web address]. If 7PM-7AM, please contact night-coverage at [email or web address]     10/14/2019, 7:11 PM

## 2019-10-14 NOTE — Progress Notes (Signed)
Pt sleeping. 

## 2019-10-15 MED ORDER — FUROSEMIDE 40 MG PO TABS: 40.0000 mg | ORAL_TABLET | Freq: Two times a day (BID) | ORAL | Status: DC

## 2019-10-15 MED ORDER — FUROSEMIDE 10 MG/ML IJ SOLN
20.0000 mg | Freq: Two times a day (BID) | INTRAMUSCULAR | Status: DC
  Administered 2019-10-15 – 2019-10-16 (×3): 20 mg via INTRAVENOUS
  Filled 2019-10-15 (×2): qty 4

## 2019-10-15 MED ORDER — FUROSEMIDE 10 MG/ML IJ SOLN
20.0000 mg | Freq: Once | INTRAMUSCULAR | Status: DC
  Filled 2019-10-15: qty 4

## 2019-10-15 NOTE — Progress Notes (Signed)
BP 130/46 and patient has amlodipine furosemide 40mg  and fursemide cardvedilol 12.5 and he does have crackles in lung sounds. Messaged MD and made him aware. MD said okay to give Carvedilol, hold the Amlodipine, and okay to give Lasix 20mg  IV.

## 2019-10-15 NOTE — Progress Notes (Signed)
Morning IV Lasix was given in afternoon because patient did not have IV access until then. Made MD aware, MD said to go ahead and give. Patients next  Lasix dose is at 1800 and MD said to give at 2100 tonight.

## 2019-10-15 NOTE — Progress Notes (Signed)
Physical Therapy Treatment Patient Details Name: Douglas Mathis MRN: [id number] DOB: January 27, 1936 Today's Date: 10/15/2019    History of Present Illness 84 y.o. male with medical history significant for diastolic congestive heart failure, hypertension, COPD chronically on 3 L of O2 who presents for a fall and altered mental status. Recurrent fall with 2 falls in the past 2 days prior to admission, appears to be mechanical tripping on oxygen cord. T 3 spinous process right inferior articulate facet fracture, acute superior compression fracture of T4 vertebral body    PT Comments    Pt is a pleasant, mildly confused 84 year old M who was admitted for AMS with PMH as described above. Pt limited in activities this session due to low oxygen saturations. Pt encountered supine, HOB elevated, with O2 sat at 86% (handheld pulse oximeter reading) with 3L via [place]. Educated pt to perform pursed lip breathing and further elevated HOB, with no improvement in O2 sat. Notified RN, who gives instruction to increase O2. At 4.5 L O2 via [place] and continued pursed lip breathing, pt's O2 sat increases to 90% after approx 5 minutes. Pt performs bed mobility to roll both directions, requiring min A and tactile cuing for sequencing/hand placement, in order to assist changing bed linens; pt's O2 sat decreasing to 84%. Pt performs scooting to top of bed for better positioning, with mod A +2 (nurse tech assisting), and HOB again elevated. Pt in no distress, and O2 sats at 88% at end of session on 4.5L; RN aware.  Pt demonstrates deficits with activity tolerance, strength and balance limiting functional mobility and requiring continued skilled PT intervention.    Follow Up Recommendations  Other (comment) (SNF recommended, but due to financial constraints, family plans to take home.)     Equipment Recommendations  Wheelchair (measurements PT)    Recommendations for Other Services       Precautions / Restrictions  Precautions Precautions: Fall Restrictions Weight Bearing Restrictions: No    Mobility  Bed Mobility Overal bed mobility: Needs Assistance Bed Mobility: Rolling Rolling: Min assist         General bed mobility comments: able to follow commands to roll L/R, requiring minimal tactile cuing for hand placement/sequencing  Transfers                 General transfer comment: deferred due to decreased o2 saturation  Ambulation/Gait             General Gait Details: Deferred due to low O2 sats with bed mobility, despite increasing O2 to 4.5 L (per RN)   Stairs             Wheelchair Mobility    Modified Rankin (Stroke Patients Only)       Balance       Sitting balance - Comments: Notassessed this session       Standing balance comment: Not assessed this session                            Cognition Arousal/Alertness: Awake/alert Behavior During Therapy: WFL for tasks assessed/performed Overall Cognitive Status: No family/caregiver present to determine baseline cognitive functioning                                 General Comments: difficult to understand due to mumbled speech; mildly confused, stating he is 84 years old when asked  Exercises Total Joint Exercises Ankle Circles/Pumps: 20 reps;AROM;Supine    General Comments        Pertinent Vitals/Pain      Home Living                      Prior Function            PT Goals (current goals can now be found in the care plan section) Acute Rehab PT Goals Patient Stated Goal: to go home  PT Goal Formulation: With patient Time For Goal Achievement: 10/22/19 Potential to Achieve Goals: Fair Progress towards PT goals: PT to reassess next treatment (Unable to assess this session due to need for increased O2)    Frequency    Min 2X/week      PT Plan Current plan remains appropriate    Co-evaluation              AM-PAC PT "6 Clicks"  Mobility   Outcome Measure  Help needed turning from your back to your side while in a flat bed without using bedrails?: A Little Help needed moving from lying on your back to sitting on the side of a flat bed without using bedrails?: A Little Help needed moving to and from a bed to a chair (including a wheelchair)?: A Lot Help needed standing up from a chair using your arms (e.g., wheelchair or bedside chair)?: A Lot Help needed to walk in hospital room?: A Lot Help needed climbing 3-5 steps with a railing? : A Lot 6 Click Score: 14    End of Session Equipment Utilized During Treatment: Gait belt;Oxygen Activity Tolerance: Other (comment) (pt limited secondary to low O2 saturation w/minimal activity) Patient left: with call bell/phone within reach;in bed Nurse Communication: Mobility status;Other (comment) (O2 sat with activity) PT Visit Diagnosis: Unsteadiness on feet (R26.81);Muscle weakness (generalized) (M62.81);Repeated falls (R29.6)     Time: 7341-9379 PT Time Calculation (min) (ACUTE ONLY): 25 min  Charges:  $Therapeutic Activity: 23-37 mins                     Kendal Hymen, PT, DPT 10/15/19, 3:57 PM

## 2019-10-15 NOTE — Progress Notes (Addendum)
Progress Note    Douglas Mathis  ZJQ:[id number] DOB: 1936/01/01  DOA: 10/06/2019 PCP: [facility]      Brief Narrative:    Medical records reviewed and are as summarized below:  Douglas Mathis is a 84 y.o. male with medical history significant fordiastolic congestive heart failure, hypertension, COPD chronically on 3 L of O2 who presented to the hospital for a fall and altered mental status.      Assessment/Plan:   Principal Problem:   AMS (altered mental status) Active Problems:   COPD (chronic obstructive pulmonary disease) (HCC)   Chronic diastolic (congestive) heart failure (HCC)   HTN (hypertension)   Hyponatremia   Fall   Spinal fracture of T3 vertebra (HCC)   Acute lower UTI   Conjunctivitis   Sepsis (HCC)   S/p syncope, likely vasovagal Acute on chronic hypoxemic respiratory failure UTI-urine culture did not show any growth.  Sepsis ruled out Acute metabolic encephalopathy-improving Acute on chronic hyponatremia-sodium slightly worse today Recurrent falls at home T3 spinous process and right T3 inferior articulating facet fracture Acute compression fracture of T4 vertebral body COPD with chronic hypoxemic respiratory failure on 3 L/min  Oxygen Acute on chronic diastolic CHF Bilateral conjunctivitis Hypertension Hyperkalemia Leukocytosis-improved   PLAN  He's still having issues with oxygenation.  Oxygen saturation was 86% 3 L/min oxygen when PT try to work with him.  Oxygen had to be increased to 4.5 L/min during PT session.  Of note, CTA of the chest done on 10/13/2019 did not show any acute pulmonary embolism or abnormality. Continue IV Lasix Continue Coreg but hold amlodipine today because of low diastolic BP Continue bronchodilators  He still recommends discharge to SNF.  However, due to high patient co-pay and financial constraints, his daughter wants to take him home with home health. Monitor patient  overnight. Possibly discharge to home tomorrow.    Body mass index is 26.79 kg/m.  Diet Order            Diet - low sodium heart healthy           DIET DYS 3           DIET DYS 3 Room service appropriate? Yes with Assist; Fluid consistency: Thin  Diet effective now                     Medications:   . amLODipine  10 mg Oral Daily  . carvedilol  12.5 mg Oral BID  . enoxaparin (LOVENOX) injection  40 mg Subcutaneous Q24H  . ferrous sulfate  325 mg Oral BID WC  . fluticasone furoate-vilanterol  1 puff Inhalation Daily   And  . umeclidinium bromide  1 puff Inhalation Daily  . furosemide  20 mg Intravenous BID  . mouth rinse  15 mL Mouth Rinse BID  . montelukast  10 mg Oral Daily  . pantoprazole  40 mg Oral Daily  . tamsulosin  0.4 mg Oral QPC breakfast   Continuous Infusions:    Anti-infectives (From admission, onward)   Start     Dose/Rate Route Frequency Ordered Stop   10/07/19 0000  cefTRIAXone (ROCEPHIN) 1 g in sodium chloride 0.9 % 100 mL IVPB  Status:  Discontinued        1 g 200 mL/hr over 30 Minutes Intravenous Every 24 hours 10/06/19 2349 10/12/19 1132             Family Communication/Anticipated D/C date and plan/Code  Status   DVT prophylaxis: enoxaparin (LOVENOX) injection 40 mg Start: 10/06/19 2330     Code Status: DNR  Family Communication: Plan discussed with patient Disposition Plan:    Status is: Inpatient  Remains inpatient appropriate because:Unsafe d/c plan and Inpatient level of care appropriate due to severity of illness   Dispo: The patient is from: Home              Anticipated d/c is to: Home              Anticipated d/c date is: 1 day              Patient currently is not medically stable to d/c.           Subjective:   Patient is still having problems with oxygenation.  No shortness of breath or chest pain.   Objective:    Vitals:   10/15/19 0829 10/15/19 0948 10/15/19 1524 10/15/19 1545  BP: (!)  146/52 (!) 130/46 (!) 141/60 (!) 141/60  Pulse: 67 70 66 69  Resp: 20  20   Temp: 98.1 F (36.7 C) 98.1 F (36.7 C) 98.6 F (37 C)   TempSrc: Oral Oral Oral   SpO2: 95%  95% (!) 86%  Weight:      Height:       No data found.   Intake/Output Summary (Last 24 hours) at 10/15/2019 1611 Last data filed at 10/15/2019 1408 Gross per 24 hour  Intake 720 ml  Output 650 ml  Net 70 ml   Filed Weights   10/06/19 1933  Weight: 75.3 kg    Exam:  GEN: No acute respiratory distress SKIN: Warm and dry EYES: No pallor or icterus ENT: MMM CV: Regular rate and rhythm. PULM: Air entry adequate bilaterally, bibasilar rales.  No wheezing heard. ABD: Soft and nontender CNS: Alert and oriented to person and place  EXT: No swelling, erythema or edema.   Data Reviewed:   I have personally reviewed following labs and imaging studies:  Labs: Labs show the following:   Basic Metabolic Panel: Recent Labs  Lab 10/10/19 0448 10/10/19 0448 10/11/19 0450 10/11/19 0450 10/12/19 1002 10/12/19 1002 10/13/19 0808 10/13/19 0808 10/14/19 0543 10/14/19 1525  NA 129*  --  129*  --  130*  --  126*  --  130*  --   K 4.4   < > 4.7   < > 5.3*   < > 5.1   < > 5.7* 4.8  CL 86*  --  86*  --  85*  --  82*  --  83*  --   CO2 33*  --  34*  --  35*  --  35*  --  37*  --   GLUCOSE 158*  --  116*  --  167*  --  130*  --  129*  --   BUN 12  --  7*  --  11  --  11  --  12  --   CREATININE 0.57*  --  0.67  --  0.74  --  0.60*  --  0.66  --   CALCIUM 8.3*  --  8.6*  --  8.7*  --  8.4*  --  8.8*  --    < > = values in this interval not displayed.   GFR Estimated Creatinine Clearance: 64.2 mL/min (by C-G formula based on SCr of 0.66 mg/dL). Liver Function Tests: No results for input(s): AST, ALT, ALKPHOS, BILITOT, PROT,  ALBUMIN in the last 168 hours. No results for input(s): LIPASE, AMYLASE in the last 168 hours. Recent Labs  Lab 10/09/19 0334  AMMONIA 36*   Coagulation profile No results for  input(s): INR, PROTIME in the last 168 hours.  CBC: Recent Labs  Lab 10/09/19 0334 10/11/19 0450 10/12/19 1002 10/13/19 0808 10/14/19 0543  WBC 10.4 12.4* 14.5* 10.9* 11.8*  NEUTROABS  --  10.2* 12.4* 9.0* 9.7*  HGB 7.9* 8.3* 8.9* 8.7* 8.8*  HCT 25.8* 26.8* 27.5* 27.1* 27.3*  MCV 89.3 88.4 87.0 85.8 85.6  PLT 217 213 200 217 231   Cardiac Enzymes: No results for input(s): CKTOTAL, CKMB, CKMBINDEX, TROPONINI in the last 168 hours. BNP (last 3 results) No results for input(s): PROBNP in the last 8760 hours. CBG: No results for input(s): GLUCAP in the last 168 hours. D-Dimer: No results for input(s): DDIMER in the last 72 hours. Hgb A1c: No results for input(s): HGBA1C in the last 72 hours. Lipid Profile: No results for input(s): CHOL, HDL, LDLCALC, TRIG, CHOLHDL, LDLDIRECT in the last 72 hours. Thyroid function studies: No results for input(s): TSH, T4TOTAL, T3FREE, THYROIDAB in the last 72 hours.  Invalid input(s): FREET3 Anemia work up: No results for input(s): VITAMINB12, FOLATE, FERRITIN, TIBC, IRON, RETICCTPCT in the last 72 hours. Sepsis Labs: Recent Labs  Lab 10/11/19 0450 10/12/19 1002 10/13/19 0808 10/14/19 0543  WBC 12.4* 14.5* 10.9* 11.8*    Microbiology Recent Results (from the past 240 hour(s))  SARS Coronavirus 2 by RT PCR (hospital order, performed in [facility] hospital lab) Nasopharyngeal Nasopharyngeal Swab     Status: None   Collection Time: 10/06/19  7:25 PM   Specimen: Nasopharyngeal Swab  Result Value Ref Range Status   SARS Coronavirus 2 NEGATIVE NEGATIVE Final    Comment: (NOTE) SARS-CoV-2 target nucleic acids are NOT DETECTED.  The SARS-CoV-2 RNA is generally detectable in upper and lower respiratory specimens during the acute phase of infection. The lowest concentration of SARS-CoV-2 viral copies this assay can detect is 250 copies / mL. A negative result does not preclude SARS-CoV-2 infection and should not be used as the sole basis  for treatment or other patient management decisions.  A negative result may occur with improper specimen collection / handling, submission of specimen other than nasopharyngeal swab, presence of viral mutation(s) within the areas targeted by this assay, and inadequate number of viral copies (<250 copies / mL). A negative result must be combined with clinical observations, patient history, and epidemiological information.  Fact Sheet for Patients:   BoilerBrush.com.cy  Fact Sheet for Healthcare Providers: https://pope.com/  This test is not yet approved or  cleared by the [place] FDA and has been authorized for detection and/or diagnosis of SARS-CoV-2 by FDA under an Emergency Use Authorization (EUA).  This EUA will remain in effect (meaning this test can be used) for the duration of the COVID-19 declaration under Section 564(b)(1) of the Act, 21 U.S.C. section 360bbb-3(b)(1), unless the authorization is terminated or revoked sooner.  Performed at [facility], [address], [place], [place] [address]   Urine Culture     Status: None   Collection Time: 10/09/19 12:10 PM   Specimen: Urine, Random  Result Value Ref Range Status   Specimen Description   Final    URINE, RANDOM Performed at [facility], [address], [place], [place] [address]    Special Requests   Final    NONE Performed at [facility], [address], [place], [place] [address]    Culture   Final    NO GROWTH Performed at [facility], [address], [place], [place] [address]    Report Status 10/10/2019 FINAL  Final    Procedures and diagnostic studies:  No results found.             LOS: 8 days   Jernee Murtaugh  [facility]   Pager on [email or web address]. If 7PM-7AM, please contact night-coverage at [email or web address]     10/15/2019, 4:11 PM

## 2019-10-16 DIAGNOSIS — J9621 Acute and chronic respiratory failure with hypoxia: Secondary | ICD-10-CM

## 2019-10-16 DIAGNOSIS — I5033 Acute on chronic diastolic (congestive) heart failure: Secondary | ICD-10-CM

## 2019-10-16 NOTE — Progress Notes (Signed)
Patietn discharged home with his daughter [name], IV removed from RFA. Discharge instructions and DNR with patient in packet.  Transferred from W?c to vehicle with one assist.

## 2019-10-16 NOTE — Care Management Important Message (Signed)
Important Message  Patient Details  Name: Douglas Mathis MRN: [id number] Date of Birth: 1935-04-25   Medicare Important Message Given:  Yes     [name] 10/16/2019, 11:28 AM

## 2019-10-16 NOTE — Progress Notes (Signed)
Physical Therapy Treatment Patient Details Name: Douglas Mathis MRN: [id number] DOB: 03-Oct-1935 Today's Date: 10/16/2019    History of Present Illness 84 y.o. male with medical history significant for diastolic congestive heart failure, hypertension, COPD chronically on 3 L of O2 who presents for a fall and altered mental status. Recurrent fall with 2 falls in the past 2 days prior to admission, appears to be mechanical tripping on oxygen cord. T 3 spinous process right inferior articulate facet fracture, acute superior compression fracture of T4 vertebral body    PT Comments    PT treatment completed. Sp02 91% at rest on 4 L02. Patient required Min guard assistance for bed mobility with extra time required due to fatigue with activity. Patient unable to stand with 2 attempts. With increased verbal cues for hand placement and technique, patient able to stand with Max A on the third attempt. Sp02 remained between 90-93% with all activity during this session on 4 L02, and no reports of dizziness with any upright activity. Patient fatigued with activity and with generalized deconditioning. SNF is most appropriate discharge recommendation, however family still planning to discharge home. Recommend physical assistance with all OOB mobility at discharge and HHPT.     Follow Up Recommendations  SNF (Family plans home. mobility assist needed and HHPT  )     Equipment Recommendations  Wheelchair (measurements PT)    Recommendations for Other Services       Precautions / Restrictions Precautions Precautions: Fall Precaution Comments:  (monitor 02 satuation during session ) Restrictions Weight Bearing Restrictions: No    Mobility  Bed Mobility Overal bed mobility: Needs Assistance Bed Mobility: Supine to Sit;Sit to Supine     Supine to sit: Min guard Sit to supine: Min guard   General bed mobility comments: Min guard for safety and extra time required to complete tasks. verbal cues for  task initiation   Transfers Overall transfer level: Needs assistance Equipment used: Rolling walker (2 wheeled)   Sit to Stand: Max assist         General transfer comment: attempted sit to stand x 2 bouts unsucessfully. verbal cues for hand placement and technique. On the third try, patient was able to stand with Max A, lifting and lowering assistance provided. Sp02 remained between 90-93% with activity   Ambulation/Gait             General Gait Details: unable to stand long enough to attempt ambulation safely at this time. limited standing tolerance of ~ 10 seconds    Stairs             Wheelchair Mobility    Modified Rankin (Stroke Patients Only)       Balance   Sitting-balance support: Feet supported Sitting balance-Leahy Scale: Fair     Standing balance support: Bilateral upper extremity supported Standing balance-Leahy Scale: Zero Standing balance comment: Max A required to maintain upright standing balance of ~ 10 seconds.                             Cognition Arousal/Alertness: Awake/alert Behavior During Therapy: WFL for tasks assessed/performed Overall Cognitive Status: No family/caregiver present to determine baseline cognitive functioning                                 General Comments: difficult to understand due to mumbled speech; mildly confused  Exercises      General Comments        Pertinent Vitals/Pain Pain Assessment: No/denies pain    Home Living                      Prior Function            PT Goals (current goals can now be found in the care plan section) Acute Rehab PT Goals Patient Stated Goal: to go home  PT Goal Formulation: With patient Time For Goal Achievement: 10/22/19 Potential to Achieve Goals: Fair Progress towards PT goals:  (slow progress towards meeting goals  )    Frequency    Min 2X/week      PT Plan Current plan remains appropriate     Co-evaluation              AM-PAC PT "6 Clicks" Mobility   Outcome Measure  Help needed turning from your back to your side while in a flat bed without using bedrails?: A Little Help needed moving from lying on your back to sitting on the side of a flat bed without using bedrails?: A Little Help needed moving to and from a bed to a chair (including a wheelchair)?: A Lot Help needed standing up from a chair using your arms (e.g., wheelchair or bedside chair)?: A Lot Help needed to walk in hospital room?: Total Help needed climbing 3-5 steps with a railing? : Total 6 Click Score: 12    End of Session Equipment Utilized During Treatment: Gait belt;Oxygen Activity Tolerance: Patient limited by fatigue Patient left: in bed;with call bell/phone within reach;with bed alarm set Nurse Communication: Mobility status PT Visit Diagnosis: Unsteadiness on feet (R26.81);Muscle weakness (generalized) (M62.81);Repeated falls (R29.6)     Time: 5397-6734 PT Time Calculation (min) (ACUTE ONLY): 24 min  Charges:  $Therapeutic Activity: 23-37 mins                     Donna Bernard, PT, MPT    Ina Homes 10/16/2019, 10:07 AM

## 2019-10-16 NOTE — Discharge Summary (Addendum)
Physician Discharge Summary  Douglas Mathis NWG:[id number] DOB: 07-18-1935 DOA: 10/06/2019  [facility]P: [facility]  Admit date: 10/06/2019 Discharge date: 10/17/2019  Discharge disposition: Home with home health    Recommendations for Outpatient Follow-Up:   Follow-up with PCP in 1 week   Discharge Diagnosis:   Principal Problem:   AMS (altered mental status) Active Problems:   COPD (chronic obstructive pulmonary disease) (HCC)   Acute on chronic diastolic (congestive) heart failure (HCC)   HTN (hypertension)   Hyponatremia   Fall   Spinal fracture of T3 vertebra (HCC)   Acute lower UTI   Conjunctivitis   Sepsis (HCC)   Acute on chronic respiratory failure with hypoxia (HCC)    Discharge Condition: Stable.  Diet recommendation:  Diet Order            Diet - low sodium heart healthy           DIET DYS 3                   Code Status: Prior     Hospital Course:   [name] is a 84 y.o. male with medical history significant for hyponatremia, chronicdiastolic congestive heart failure, hypertension, tobacco use disorder, COPD chronically on 3 L of O2 who presented to the hospital for a fall and altered mental status.  It is suspected that he may have tripped on his oxygen cord.  Work-up revealed acute on chronic hyponatremia, acute metabolic encephalopathy, acute lower UTI and T3 and T4 vertebral fracture.  He was treated with IV fluids and empiric IV antibiotics.  He was on Lasix which was held on admission.  He also had bilateral conjunctivitis that was treated with erythromycin eye ointment.  Hospitalization was complicated by vasovagal syncope, bradycardia and acute on chronic hypoxemic respiratory failure when he was being transferred from his bed to the chair for discharge.  CTA was negative for any pneumonia or pulmonary embolism.  However, there was evidence of atelectasis.  His discharge was held and he was treated with  oxygen via nasal cannula (increased up to 15 L/min eventually coming down to 6 L/min).  He was treated with IV Lasix and oxygenation slowly improved back to baseline 3 L/min.  Worsening hypoxemia was likely due to acute on chronic diastolic CHF.  He was evaluated by PT and OT recommended discharge to SNF because of debility and frequent falls.  However, his daughter, Douglas Mathis, insisted on taking the patient home because they could not afford the co-pay for nursing home.  She understands that patient is at high risk for falls.  His condition has improved and he is deemed stable for discharge to home.  Plan was discussed with the patient and his daughter, Douglas Mathis.     Discharge Exam:    Vitals:   10/15/19 1545 10/15/19 1753 10/15/19 2357 10/16/19 0754  BP: (!) 141/60  (!) 121/95 (!) 135/57  Pulse: 69 70 60 63  Resp:   16 19  Temp:   98.5 F (36.9 C) 97.6 F (36.4 C)  TempSrc:   Oral Oral  SpO2: (!) 86% 98% 97% 100%  Weight:      Height:         GEN: NAD SKIN: Warm and dry EYES: EOMI ENT: MMM CV: RRR PULM: Air entry adequate bilaterally, no wheezing heard but he still has bibasilar rales ABD: soft, ND, NT, +BS CNS: AAO x person and place, non focal.  Speech is  slurred at baseline and difficult to understand EXT: No edema or tenderness   The results of significant diagnostics from this hospitalization (including imaging, microbiology, ancillary and laboratory) are listed below for reference.     Procedures and Diagnostic Studies:   CT HEAD WO CONTRAST  Result Date: 10/06/2019 CLINICAL DATA:  Fall EXAM: CT HEAD WITHOUT CONTRAST TECHNIQUE: Contiguous axial images were obtained from the base of the skull through the vertex without intravenous contrast. COMPARISON:  06/26/2019 FINDINGS: Brain: No acute intracranial abnormality. Specifically, no hemorrhage, hydrocephalus, mass lesion, acute infarction, or significant intracranial injury. Vascular: No hyperdense vessel or unexpected  calcification. Skull: No acute calvarial abnormality. Sinuses/Orbits: Visualized paranasal sinuses and mastoids clear. Orbital soft tissues unremarkable. Other: None IMPRESSION: No acute intracranial abnormality. Electronically Signed   By: Charlett Nose M.D.   On: 10/06/2019 22:13   CT CERVICAL SPINE WO CONTRAST  Result Date: 10/06/2019 CLINICAL DATA:  Neck pain EXAM: CT CERVICAL SPINE WITHOUT CONTRAST TECHNIQUE: Multidetector CT imaging of the cervical spine was performed without intravenous contrast. Multiplanar CT image reconstructions were also generated. COMPARISON:  06/10/2016 FINDINGS: Alignment: Normal Skull base and vertebrae: No acute fracture. No primary bone lesion or focal pathologic process. Soft tissues and spinal canal: No prevertebral fluid or swelling. No visible canal hematoma. Disc levels:  Diffuse degenerative disc disease and facet disease. Upper chest: No acute findings Other: None IMPRESSION: Degenerative disc and facet disease.  No acute bony abnormality. Electronically Signed   By: Charlett Nose M.D.   On: 10/06/2019 22:15   DG Pelvis Portable  Result Date: 10/06/2019 CLINICAL DATA:  Mechanical fall EXAM: PORTABLE PELVIS 1-2 VIEWS COMPARISON:  None. FINDINGS: There is no evidence of pelvic fracture or diastasis. No pelvic bone lesions are seen. Slightly limited evaluation of the right pubic rami due to positioning. There are vascular calcifications. IMPRESSION: No definite acute osseous abnormality Electronically Signed   By: Jasmine Pang M.D.   On: 10/06/2019 19:56   CT CHEST ABDOMEN PELVIS W CONTRAST  Result Date: 10/06/2019 CLINICAL DATA:  Fall change in mental status EXAM: CT CHEST, ABDOMEN, AND PELVIS WITH CONTRAST TECHNIQUE: Multidetector CT imaging of the chest, abdomen and pelvis was performed following the standard protocol during bolus administration of intravenous contrast. CONTRAST:  OMNIPAQUE IOHEXOL 300 MG/ML  SOLN COMPARISON:  None. FINDINGS: Cardiovascular:  There is mild cardiomegaly. Coronary artery calcifications are present. There is scattered aortic atherosclerosis noted. Calcifications are also seen within the mitral valve and aortic valve. No significant pericardial fluid/thickening. Great vessels are normal in course and caliber. No evidence of acute thoracic aortic injury. No central pulmonary emboli. Mediastinum/Nodes: No pneumomediastinum. No mediastinal hematoma. Unremarkable esophagus. No axillary, mediastinal or hilar lymphadenopathy. Lungs/Pleura:Again noted is areas of pleural thickening and calcifications at the left lung base. There is stable areas of bronchiectasis and scarring at the left lung base. No large airspace consolidation or pleural effusion are seen. There is centrilobular emphysematous changes seen at both lung apices with scarring. No pneumothorax. Musculoskeletal: There is a obliquely oriented fracture seen through the T3 spinous process and the right T3 inferior articulating facet. There is an acute superior compression fracture of the T4 vertebral body with slight buckling of the posterosuperior cortex. No retropulsion of fragments however is seen. Abdomen/pelvis: Hepatobiliary: Homogeneous hepatic attenuation without traumatic injury. No focal lesion. The patient is status post cholecystectomy. A small amount of pneumobilia is present which could be from prior intervention. For. Pancreas: No evidence for traumatic injury. Portions are partially  obscured by adjacent bowel loops and paucity of intra-abdominal fat. No ductal dilatation or inflammation. Spleen: Homogeneous attenuation without traumatic injury. Normal in size. Adrenals/Urinary Tract: No adrenal hemorrhage. Kidneys demonstrate symmetric enhancement and excretion on delayed phase imaging. No evidence or renal injury. Ureters are well opacified proximal through mid portion. Bladder is physiologically distended without wall thickening. Stomach/Bowel: Suboptimally assessed  without enteric contrast, allowing for this, no evidence of bowel injury. Stomach physiologically distended. There are no dilated or thickened small or large bowel loops. Moderate to large amount of colonic stool is present. Scattered colonic diverticula are noted. No evidence of mesenteric hematoma. No free air free fluid. Vascular/Lymphatic: No acute vascular injury. The abdominal aorta and IVC are intact. No evidence of retroperitoneal, abdominal, or pelvic adenopathy. Reproductive: No acute abnormality. Heterogeneously enlarged prostate gland is noted. Other: No focal contusion or abnormality of the abdominal wall. Musculoskeletal: No acute fracture of the lumbar spine or bony pelvis. IMPRESSION: Nondisplaced T3 spinous process and right inferior articulating facet fracture. Probable acute superior compression fracture of the T4 vertebral body with less than 25% loss in height. No other acute intrathoracic, abdominal, or pelvic injury. Aortic Atherosclerosis (ICD10-I70.0). Electronically Signed   By: Jonna Clark M.D.   On: 10/06/2019 22:21   DG Chest Port 1 View  Result Date: 10/06/2019 CLINICAL DATA:  84 year old male with fall. EXAM: PORTABLE CHEST 1 VIEW COMPARISON:  Chest radiograph dated 06/25/2019. FINDINGS: Background of emphysema. There is a small, likely chronic left pleural effusion and left pleural thickening along the left lung base. Right lung base reticulonodular densities appears similar to prior radiograph, and likely chronic. No new consolidation. There is no pneumothorax. Mild cardiomegaly. Atherosclerotic calcification of the aorta. No acute osseous pathology. IMPRESSION: 1. No acute cardiopulmonary process.  No interval change. 2. Emphysema and chronic left pleural thickening. Electronically Signed   By: Elgie Collard M.D.   On: 10/06/2019 19:55     Labs:   Basic Metabolic Panel: Recent Labs  Lab 10/11/19 0450 10/11/19 0450 10/12/19 1002 10/12/19 1002 10/13/19 0808  10/13/19 0808 10/14/19 0543 10/14/19 1525  NA 129*  --  130*  --  126*  --  130*  --   K 4.7   < > 5.3*   < > 5.1   < > 5.7* 4.8  CL 86*  --  85*  --  82*  --  83*  --   CO2 34*  --  35*  --  35*  --  37*  --   GLUCOSE 116*  --  167*  --  130*  --  129*  --   BUN 7*  --  11  --  11  --  12  --   CREATININE 0.67  --  0.74  --  0.60*  --  0.66  --   CALCIUM 8.6*  --  8.7*  --  8.4*  --  8.8*  --    < > = values in this interval not displayed.   GFR Estimated Creatinine Clearance: 64.2 mL/min (by C-G formula based on SCr of 0.66 mg/dL). Liver Function Tests: No results for input(s): AST, ALT, ALKPHOS, BILITOT, PROT, ALBUMIN in the last 168 hours. No results for input(s): LIPASE, AMYLASE in the last 168 hours. No results for input(s): AMMONIA in the last 168 hours. Coagulation profile No results for input(s): INR, PROTIME in the last 168 hours.  CBC: Recent Labs  Lab 10/11/19 0450 10/12/19 1002 10/13/19 0808 10/14/19 0543  WBC 12.4* 14.5* 10.9* 11.8*  NEUTROABS 10.2* 12.4* 9.0* 9.7*  HGB 8.3* 8.9* 8.7* 8.8*  HCT 26.8* 27.5* 27.1* 27.3*  MCV 88.4 87.0 85.8 85.6  PLT 213 200 217 231   Cardiac Enzymes: No results for input(s): CKTOTAL, CKMB, CKMBINDEX, TROPONINI in the last 168 hours. BNP: Invalid input(s): POCBNP CBG: No results for input(s): GLUCAP in the last 168 hours. D-Dimer No results for input(s): DDIMER in the last 72 hours. Hgb A1c No results for input(s): HGBA1C in the last 72 hours. Lipid Profile No results for input(s): CHOL, HDL, LDLCALC, TRIG, CHOLHDL, LDLDIRECT in the last 72 hours. Thyroid function studies No results for input(s): TSH, T4TOTAL, T3FREE, THYROIDAB in the last 72 hours.  Invalid input(s): FREET3 Anemia work up No results for input(s): VITAMINB12, FOLATE, FERRITIN, TIBC, IRON, RETICCTPCT in the last 72 hours. Microbiology Recent Results (from the past 240 hour(s))  Urine Culture     Status: None   Collection Time: 10/09/19 12:10 PM    Specimen: Urine, Random  Result Value Ref Range Status   Specimen Description   Final    URINE, RANDOM Performed at [facility], [address], [place], [place] [address]    Special Requests   Final    NONE Performed at [facility], [address], [place], [place] [address]    Culture   Final    NO GROWTH Performed at [facility], [address] [place], [place] [address]    Report Status 10/10/2019 FINAL  Final     Discharge Instructions:   Discharge Instructions    DIET DYS 3   Complete by: As directed    Fluid consistency: Thin   Diet - low sodium heart healthy   Complete by: As directed    Face-to-face encounter (required for Medicare/Medicaid patients)   Complete by: As directed    I Kori Goins certify that this patient is under my care and that I, or a nurse practitioner or physician's assistant working with me, had a face-to-face encounter that meets the physician face-to-face encounter requirements with this patient on 10/12/2019. The encounter with the patient was in whole, or in part for the following medical condition(s) which is the primary reason for home health care (List medical condition): COPD, chronic diastolic CHF, generalized weakness   The encounter with the patient was in whole, or in part, for the following medical condition, which is the primary reason for home health care: COPD, chronic diastolic CHF, generalized weakness   I certify that, based on my findings, the following services are medically necessary home health services: Physical therapy   Reason for Medically Necessary Home Health Services: Therapy- Investment banker, operational, Patent examiner   My clinical findings support the need for the above services:  Cognitive impairments, dementia, or mental confusion  that make it unsafe to leave home Unable to leave home safely without assistance and/or assistive device     Further, I certify that my clinical  findings support that this patient is homebound due to: Unable to leave home safely without assistance   For home use only DME lightweight manual wheelchair with seat cushion   Complete by: As directed    Patient suffers from Weakness which impairs their ability to perform daily activities like ADL's in the home.  A walker will not resolve issue with performing activities of daily living. A wheelchair will allow patient to safely perform daily activities. Patient is not able to propel themselves  in the home using a standard weight wheelchair due to weakness. Patient can self propel in the lightweight wheelchair.   Home Health   Complete by: As directed    To provide the following care/treatments:  PT Home Health Aide     Increase activity slowly   Complete by: As directed      Allergies as of 10/16/2019   No Known Allergies     Medication List    STOP taking these medications   lisinopril 5 MG tablet Commonly known as: ZESTRIL   pravastatin 40 MG tablet Commonly known as: PRAVACHOL   predniSONE 10 MG tablet Commonly known as: DELTASONE     TAKE these medications   amLODipine 10 MG tablet Commonly known as: NORVASC Take 10 mg by mouth daily.   carvedilol 6.25 MG tablet Commonly known as: Coreg Take 1 tablet (6.25 mg total) by mouth 2 (two) times daily.   DOK 100 MG capsule Generic drug: docusate sodium Take 100 mg by mouth 2 (two) times daily. Notes to patient: Not given during hospital stay    feeding supplement (ENSURE ENLIVE) Liqd Take 237 mLs by mouth 3 (three) times daily between meals. Notes to patient: Not given during hospital stay    ferrous sulfate 325 (65 FE) MG tablet Take 1 tablet (325 mg total) by mouth 2 (two) times daily with a meal.   Fluticasone-Umeclidin-Vilant 100-62.5-25 MCG/INH Aepb Inhale 1 puff into the lungs daily. Notes to patient: Not given during hospital stay , may take as you were before starting tomorrow morning   furosemide 40 MG  tablet Commonly known as: LASIX Take 40 mg by mouth 2 (two) times daily. Notes to patient: Not given during hospital stay    ipratropium-albuterol 0.5-2.5 (3) MG/3ML Soln Commonly known as: DUONEB Take 3 mLs by nebulization every 6 (six) hours as needed. Notes to patient: Not given during hospital stay    montelukast 10 MG tablet Commonly known as: SINGULAIR Take 10 mg by mouth daily.   multivitamin with minerals Tabs tablet Take 1 tablet by mouth daily. Notes to patient: Not given during hospital stay    pantoprazole 40 MG tablet Commonly known as: PROTONIX Take 40 mg by mouth daily.   ProAir HFA 108 (90 Base) MCG/ACT inhaler Generic drug: albuterol Inhale 2 puffs into the lungs 4 (four) times daily as needed for wheezing or shortness of breath. Notes to patient: Not given during hospital stay    tamsulosin 0.4 MG Caps capsule Commonly known as: FLOMAX Take 0.4 mg by mouth daily after breakfast.   Vitamin C 500 MG Caps Take 1 capsule by mouth 2 (two) times daily. Notes to patient: Not given during hospital stay             Durable Medical Equipment  (From admission, onward)         Start     Ordered   10/12/19 0000  For home use only DME lightweight manual wheelchair with seat cushion       Comments: Patient suffers from Weakness which impairs their ability to perform daily activities like ADL's in the home.  A walker will not resolve issue with performing activities of daily living. A wheelchair will allow patient to safely perform daily activities. Patient is not able to propel themselves in the home using a standard weight wheelchair due to weakness. Patient can self propel in the lightweight wheelchair.   10/12/19 1251  Time coordinating discharge: 32 minutes  Signed:  Cephus Tupy  [facility] 10/17/2019, 5:15 PM   Pager on [email or web address]. If 7PM-7AM, please contact night-coverage at [email or web address]

## 2019-10-17 DIAGNOSIS — J9621 Acute and chronic respiratory failure with hypoxia: Secondary | ICD-10-CM | POA: Diagnosis not present

## 2019-10-20 ENCOUNTER — Inpatient Hospital Stay
Admission: EM | Admit: 2019-10-20 | Discharge: 2019-10-24 | DRG: 189 | Disposition: A | Discharge status: Skilled Nursing Facility | Payer: Medicare Other | Attending: Internal Medicine | Admitting: Internal Medicine

## 2019-10-20 ENCOUNTER — Emergency Department: Payer: Medicare Other

## 2019-10-20 ENCOUNTER — Other Ambulatory Visit: Payer: Self-pay

## 2019-10-20 ENCOUNTER — Encounter: Payer: Self-pay | Admitting: Emergency Medicine

## 2019-10-20 DIAGNOSIS — J439 Emphysema, unspecified: Secondary | ICD-10-CM | POA: Diagnosis present

## 2019-10-20 DIAGNOSIS — Z833 Family history of diabetes mellitus: Secondary | ICD-10-CM

## 2019-10-20 DIAGNOSIS — I5033 Acute on chronic diastolic (congestive) heart failure: Secondary | ICD-10-CM | POA: Diagnosis present

## 2019-10-20 DIAGNOSIS — Z7189 Other specified counseling: Secondary | ICD-10-CM | POA: Diagnosis not present

## 2019-10-20 DIAGNOSIS — E875 Hyperkalemia: Secondary | ICD-10-CM | POA: Diagnosis present

## 2019-10-20 DIAGNOSIS — R296 Repeated falls: Secondary | ICD-10-CM | POA: Diagnosis present

## 2019-10-20 DIAGNOSIS — D509 Iron deficiency anemia, unspecified: Secondary | ICD-10-CM | POA: Diagnosis present

## 2019-10-20 DIAGNOSIS — I110 Hypertensive heart disease with heart failure: Secondary | ICD-10-CM | POA: Diagnosis present

## 2019-10-20 DIAGNOSIS — Z79899 Other long term (current) drug therapy: Secondary | ICD-10-CM | POA: Diagnosis not present

## 2019-10-20 DIAGNOSIS — Z66 Do not resuscitate: Secondary | ICD-10-CM | POA: Diagnosis present

## 2019-10-20 DIAGNOSIS — R269 Unspecified abnormalities of gait and mobility: Secondary | ICD-10-CM | POA: Diagnosis present

## 2019-10-20 DIAGNOSIS — Z8249 Family history of ischemic heart disease and other diseases of the circulatory system: Secondary | ICD-10-CM | POA: Diagnosis not present

## 2019-10-20 DIAGNOSIS — Z9981 Dependence on supplemental oxygen: Secondary | ICD-10-CM | POA: Diagnosis not present

## 2019-10-20 DIAGNOSIS — I452 Bifascicular block: Secondary | ICD-10-CM | POA: Diagnosis present

## 2019-10-20 DIAGNOSIS — J441 Chronic obstructive pulmonary disease with (acute) exacerbation: Secondary | ICD-10-CM | POA: Diagnosis present

## 2019-10-20 DIAGNOSIS — Z20822 Contact with and (suspected) exposure to covid-19: Secondary | ICD-10-CM | POA: Diagnosis present

## 2019-10-20 DIAGNOSIS — Z809 Family history of malignant neoplasm, unspecified: Secondary | ICD-10-CM

## 2019-10-20 DIAGNOSIS — Z9111 Patient's noncompliance with dietary regimen: Secondary | ICD-10-CM

## 2019-10-20 DIAGNOSIS — N400 Enlarged prostate without lower urinary tract symptoms: Secondary | ICD-10-CM | POA: Diagnosis present

## 2019-10-20 DIAGNOSIS — Z515 Encounter for palliative care: Secondary | ICD-10-CM

## 2019-10-20 DIAGNOSIS — J9622 Acute and chronic respiratory failure with hypercapnia: Secondary | ICD-10-CM | POA: Diagnosis present

## 2019-10-20 DIAGNOSIS — J9621 Acute and chronic respiratory failure with hypoxia: Secondary | ICD-10-CM | POA: Diagnosis present

## 2019-10-20 DIAGNOSIS — I10 Essential (primary) hypertension: Secondary | ICD-10-CM | POA: Diagnosis present

## 2019-10-20 DIAGNOSIS — J449 Chronic obstructive pulmonary disease, unspecified: Secondary | ICD-10-CM | POA: Diagnosis not present

## 2019-10-20 DIAGNOSIS — Z825 Family history of asthma and other chronic lower respiratory diseases: Secondary | ICD-10-CM

## 2019-10-20 DIAGNOSIS — E7800 Pure hypercholesterolemia, unspecified: Secondary | ICD-10-CM | POA: Diagnosis present

## 2019-10-20 DIAGNOSIS — K219 Gastro-esophageal reflux disease without esophagitis: Secondary | ICD-10-CM | POA: Diagnosis present

## 2019-10-20 DIAGNOSIS — R4189 Other symptoms and signs involving cognitive functions and awareness: Secondary | ICD-10-CM | POA: Diagnosis present

## 2019-10-20 DIAGNOSIS — E871 Hypo-osmolality and hyponatremia: Secondary | ICD-10-CM | POA: Diagnosis present

## 2019-10-20 DIAGNOSIS — R54 Age-related physical debility: Secondary | ICD-10-CM | POA: Diagnosis present

## 2019-10-20 DIAGNOSIS — F17210 Nicotine dependence, cigarettes, uncomplicated: Secondary | ICD-10-CM | POA: Diagnosis present

## 2019-10-20 DIAGNOSIS — G9341 Metabolic encephalopathy: Secondary | ICD-10-CM | POA: Diagnosis present

## 2019-10-20 LAB — COMPREHENSIVE METABOLIC PANEL
ALT: 24 U/L (ref 0–44)
AST: 31 U/L (ref 15–41)
Albumin: 3.5 g/dL (ref 3.5–5.0)
Alkaline Phosphatase: 60 U/L (ref 38–126)
Anion gap: 14 (ref 5–15)
BUN: 16 mg/dL (ref 8–23)
CO2: 43 mmol/L — ABNORMAL HIGH (ref 22–32)
Calcium: 9.2 mg/dL (ref 8.9–10.3)
Chloride: 87 mmol/L — ABNORMAL LOW (ref 98–111)
Creatinine, Ser: 0.93 mg/dL (ref 0.61–1.24)
GFR calc Af Amer: >60 mL/min (ref >60)
GFR calc non Af Amer: >60 mL/min (ref >60)
Glucose, Bld: 180 mg/dL — ABNORMAL HIGH (ref 70–99)
Potassium: 4.7 mmol/L (ref 3.5–5.1)
Sodium: 144 mmol/L (ref 135–145)
Total Bilirubin: 0.8 mg/dL (ref 0.3–1.2)
Total Protein: 7 g/dL (ref 6.5–8.1)

## 2019-10-20 LAB — CBC WITH DIFFERENTIAL/PLATELET
Abs Immature Granulocytes: 0.22 10*3/uL — ABNORMAL HIGH (ref 0.00–0.07)
Basophils Absolute: 0 10*3/uL (ref 0.0–0.1)
Basophils Relative: 0 %
Eosinophils Absolute: 0 10*3/uL (ref 0.0–0.5)
Eosinophils Relative: 0 %
HCT: 27.8 % — ABNORMAL LOW (ref 39.0–52.0)
Hemoglobin: 8.4 g/dL — ABNORMAL LOW (ref 13.0–17.0)
Immature Granulocytes: 2 %
Lymphocytes Relative: 4 %
Lymphs Abs: 0.5 10*3/uL — ABNORMAL LOW (ref 0.7–4.0)
MCH: 26.5 pg (ref 26.0–34.0)
MCHC: 30.2 g/dL (ref 30.0–36.0)
MCV: 87.7 fL (ref 80.0–100.0)
Monocytes Absolute: 1 10*3/uL (ref 0.1–1.0)
Monocytes Relative: 7 %
Neutro Abs: 12.2 10*3/uL — ABNORMAL HIGH (ref 1.7–7.7)
Neutrophils Relative %: 87 %
Platelets: 345 10*3/uL (ref 150–400)
RBC: 3.17 MIL/uL — ABNORMAL LOW (ref 4.22–5.81)
RDW: 17.6 % — ABNORMAL HIGH (ref 11.5–15.5)
WBC: 13.9 10*3/uL — ABNORMAL HIGH (ref 4.0–10.5)
nRBC: 0 % (ref 0.0–0.2)

## 2019-10-20 LAB — BLOOD GAS, VENOUS
Acid-Base Excess: 17.3 mmol/L — ABNORMAL HIGH (ref 0.0–2.0)
Acid-Base Excess: 18 mmol/L — ABNORMAL HIGH (ref 0.0–2.0)
Bicarbonate: 47 mmol/L — ABNORMAL HIGH (ref 20.0–28.0)
Bicarbonate: 45.4 mmol/L — ABNORMAL HIGH (ref 20.0–28.0)
O2 Saturation: 27.2 %
O2 Saturation: 52 %
Patient temperature: 37
Patient temperature: 37
pCO2, Ven: 100 mmHg (ref 44.0–60.0)
pCO2, Ven: 75 mmHg (ref 44.0–60.0)
pH, Ven: 7.28 (ref 7.250–7.430)
pH, Ven: 7.39 (ref 7.250–7.430)

## 2019-10-20 LAB — BLOOD GAS, ARTERIAL
Acid-Base Excess: 18.1 mmol/L — ABNORMAL HIGH (ref 0.0–2.0)
Bicarbonate: 44.8 mmol/L — ABNORMAL HIGH (ref 20.0–28.0)
Delivery systems: POSITIVE
Expiratory PAP: 6
FIO2: 0.4
Inspiratory PAP: 12
O2 Saturation: 92.2 %
PEEP: 6 cmH2O
Patient temperature: 37
RATE: 12 resp/min
pCO2 arterial: 69 mmHg (ref 32.0–48.0)
pH, Arterial: 7.42 (ref 7.350–7.450)
pO2, Arterial: 63 mmHg — ABNORMAL LOW (ref 83.0–108.0)

## 2019-10-20 LAB — PROTIME-INR
INR: 1 (ref 0.8–1.2)
Prothrombin Time: 12.5 seconds (ref 11.4–15.2)

## 2019-10-20 LAB — CBC
HCT: 25.9 % — ABNORMAL LOW (ref 39.0–52.0)
Hemoglobin: 7.9 g/dL — ABNORMAL LOW (ref 13.0–17.0)
MCH: 26.5 pg (ref 26.0–34.0)
MCHC: 30.5 g/dL (ref 30.0–36.0)
MCV: 86.9 fL (ref 80.0–100.0)
Platelets: 311 10*3/uL (ref 150–400)
RBC: 2.98 MIL/uL — ABNORMAL LOW (ref 4.22–5.81)
RDW: 17.7 % — ABNORMAL HIGH (ref 11.5–15.5)
WBC: 9.1 10*3/uL (ref 4.0–10.5)
nRBC: 0 % (ref 0.0–0.2)

## 2019-10-20 LAB — URINALYSIS, COMPLETE (UACMP) WITH MICROSCOPIC
Bilirubin Urine: NEGATIVE
Glucose, UA: NEGATIVE mg/dL
Ketones, ur: NEGATIVE mg/dL
Nitrite: NEGATIVE
Protein, ur: 30 mg/dL — AB
RBC / HPF: >50 RBC/hpf — ABNORMAL HIGH (ref 0–5)
Specific Gravity, Urine: 1.01 (ref 1.005–1.030)
pH: 5 (ref 5.0–8.0)

## 2019-10-20 LAB — CREATININE, SERUM
Creatinine, Ser: 0.88 mg/dL (ref 0.61–1.24)
GFR calc Af Amer: >60 mL/min (ref >60)
GFR calc non Af Amer: >60 mL/min (ref >60)

## 2019-10-20 LAB — LIPASE, BLOOD: Lipase: 18 U/L (ref 11–51)

## 2019-10-20 LAB — SARS CORONAVIRUS 2 BY RT PCR (HOSPITAL ORDER, PERFORMED IN ~~LOC~~ HOSPITAL LAB): SARS Coronavirus 2: NEGATIVE

## 2019-10-20 LAB — TROPONIN I (HIGH SENSITIVITY): Troponin I (High Sensitivity): 14 ng/L (ref <18)

## 2019-10-20 LAB — LACTIC ACID, PLASMA: Lactic Acid, Venous: 1.3 mmol/L (ref 0.5–1.9)

## 2019-10-20 LAB — APTT: aPTT: 33 seconds (ref 24–36)

## 2019-10-20 LAB — PROCALCITONIN: Procalcitonin: <0.1 ng/mL

## 2019-10-20 LAB — BRAIN NATRIURETIC PEPTIDE: B Natriuretic Peptide: 353.2 pg/mL — ABNORMAL HIGH (ref 0.0–100.0)

## 2019-10-20 MED ORDER — FUROSEMIDE 10 MG/ML IJ SOLN
40.0000 mg | Freq: Two times a day (BID) | INTRAMUSCULAR | Status: DC
  Administered 2019-10-21 – 2019-10-23 (×4): 40 mg via INTRAVENOUS
  Filled 2019-10-20 (×4): qty 4

## 2019-10-20 MED ORDER — ENSURE ENLIVE PO LIQD
237.0000 mL | Freq: Three times a day (TID) | ORAL | Status: DC
  Administered 2019-10-21 – 2019-10-24 (×10): 237 mL via ORAL

## 2019-10-20 MED ORDER — IPRATROPIUM-ALBUTEROL 0.5-2.5 (3) MG/3ML IN SOLN: 3.0000 mL | Freq: Four times a day (QID) | RESPIRATORY_TRACT | Status: DC | PRN

## 2019-10-20 MED ORDER — TAMSULOSIN HCL 0.4 MG PO CAPS
0.4000 mg | ORAL_CAPSULE | Freq: Every day | ORAL | Status: DC
  Administered 2019-10-21 – 2019-10-24 (×4): 0.4 mg via ORAL
  Filled 2019-10-20 (×4): qty 1

## 2019-10-20 MED ORDER — FLUTICASONE FUROATE-VILANTEROL 100-25 MCG/INH IN AEPB
1.0000 | INHALATION_SPRAY | Freq: Every day | RESPIRATORY_TRACT | Status: DC
  Administered 2019-10-20 – 2019-10-24 (×4): 1 via RESPIRATORY_TRACT
  Filled 2019-10-20: qty 28

## 2019-10-20 MED ORDER — PANTOPRAZOLE SODIUM 40 MG PO TBEC
40.0000 mg | DELAYED_RELEASE_TABLET | Freq: Every day | ORAL | Status: DC
  Administered 2019-10-20 – 2019-10-24 (×5): 40 mg via ORAL
  Filled 2019-10-20 (×5): qty 1

## 2019-10-20 MED ORDER — ACETAMINOPHEN 325 MG PO TABS
650.0000 mg | ORAL_TABLET | ORAL | Status: DC | PRN
  Administered 2019-10-20 – 2019-10-24 (×7): 650 mg via ORAL
  Filled 2019-10-20 (×7): qty 2

## 2019-10-20 MED ORDER — ENALAPRIL MALEATE 2.5 MG PO TABS
2.5000 mg | ORAL_TABLET | Freq: Every day | ORAL | Status: DC
  Administered 2019-10-20 – 2019-10-24 (×4): 2.5 mg via ORAL
  Filled 2019-10-20 (×5): qty 1

## 2019-10-20 MED ORDER — FERROUS SULFATE 325 (65 FE) MG PO TABS
325.0000 mg | ORAL_TABLET | Freq: Two times a day (BID) | ORAL | Status: DC
  Administered 2019-10-20 – 2019-10-24 (×8): 325 mg via ORAL
  Filled 2019-10-20 (×9): qty 1

## 2019-10-20 MED ORDER — ENOXAPARIN SODIUM 40 MG/0.4ML ~~LOC~~ SOLN
40.0000 mg | SUBCUTANEOUS | Status: DC
  Administered 2019-10-20 – 2019-10-22 (×3): 40 mg via SUBCUTANEOUS
  Filled 2019-10-20 (×3): qty 0.4

## 2019-10-20 MED ORDER — AMLODIPINE BESYLATE 10 MG PO TABS
10.0000 mg | ORAL_TABLET | Freq: Every day | ORAL | Status: DC
  Administered 2019-10-20 – 2019-10-23 (×4): 10 mg via ORAL
  Filled 2019-10-20 (×3): qty 1
  Filled 2019-10-20: qty 2
  Filled 2019-10-20: qty 1

## 2019-10-20 MED ORDER — SODIUM CHLORIDE 0.9 % IV SOLN: 250.0000 mL | INTRAVENOUS | Status: DC | PRN

## 2019-10-20 MED ORDER — ALBUTEROL SULFATE (2.5 MG/3ML) 0.083% IN NEBU: 3.0000 mL | INHALATION_SOLUTION | Freq: Four times a day (QID) | RESPIRATORY_TRACT | Status: DC | PRN

## 2019-10-20 MED ORDER — SODIUM CHLORIDE 0.9% FLUSH
3.0000 mL | Freq: Two times a day (BID) | INTRAVENOUS | Status: DC
  Administered 2019-10-20 (×2): 3 mL via INTRAVENOUS

## 2019-10-20 MED ORDER — FUROSEMIDE 10 MG/ML IJ SOLN
40.0000 mg | Freq: Once | INTRAMUSCULAR | Status: AC
  Administered 2019-10-20: 40 mg via INTRAVENOUS
  Filled 2019-10-20: qty 4

## 2019-10-20 MED ORDER — SODIUM CHLORIDE 0.9% FLUSH: 3.0000 mL | INTRAVENOUS | Status: DC | PRN

## 2019-10-20 MED ORDER — MONTELUKAST SODIUM 10 MG PO TABS
10.0000 mg | ORAL_TABLET | Freq: Every day | ORAL | Status: DC
  Administered 2019-10-20 – 2019-10-24 (×5): 10 mg via ORAL
  Filled 2019-10-20 (×6): qty 1

## 2019-10-20 MED ORDER — ASCORBIC ACID 500 MG PO TABS
500.0000 mg | ORAL_TABLET | Freq: Two times a day (BID) | ORAL | Status: DC
  Administered 2019-10-20 – 2019-10-24 (×9): 500 mg via ORAL
  Filled 2019-10-20 (×12): qty 1

## 2019-10-20 MED ORDER — ADULT MULTIVITAMIN W/MINERALS CH
1.0000 | ORAL_TABLET | Freq: Every day | ORAL | Status: DC
  Administered 2019-10-20 – 2019-10-24 (×5): 1 via ORAL
  Filled 2019-10-20 (×5): qty 1

## 2019-10-20 MED ORDER — CARVEDILOL 3.125 MG PO TABS
6.2500 mg | ORAL_TABLET | Freq: Two times a day (BID) | ORAL | Status: DC
  Administered 2019-10-20 – 2019-10-24 (×8): 6.25 mg via ORAL
  Filled 2019-10-20 (×4): qty 2
  Filled 2019-10-20: qty 1
  Filled 2019-10-20 (×3): qty 2

## 2019-10-20 MED ORDER — FUROSEMIDE 10 MG/ML IJ SOLN
40.0000 mg | Freq: Two times a day (BID) | INTRAMUSCULAR | Status: DC
  Filled 2019-10-20: qty 4

## 2019-10-20 MED ORDER — UMECLIDINIUM BROMIDE 62.5 MCG/INH IN AEPB
1.0000 | INHALATION_SPRAY | Freq: Every day | RESPIRATORY_TRACT | Status: DC
  Administered 2019-10-20 – 2019-10-21 (×2): 1 via RESPIRATORY_TRACT
  Filled 2019-10-20 (×2): qty 7

## 2019-10-20 MED ORDER — METHYLPREDNISOLONE SODIUM SUCC 125 MG IJ SOLR
60.0000 mg | INTRAMUSCULAR | Status: DC
  Administered 2019-10-20 – 2019-10-21 (×2): 60 mg via INTRAVENOUS
  Filled 2019-10-20 (×2): qty 2

## 2019-10-20 MED ORDER — ONDANSETRON HCL 4 MG/2ML IJ SOLN: 4.0000 mg | Freq: Four times a day (QID) | INTRAMUSCULAR | Status: DC | PRN

## 2019-10-20 MED ORDER — FLUTICASONE-UMECLIDIN-VILANT 100-62.5-25 MCG/INH IN AEPB: 1.0000 | INHALATION_SPRAY | Freq: Every day | RESPIRATORY_TRACT | Status: DC

## 2019-10-20 MED ORDER — DOCUSATE SODIUM 100 MG PO CAPS
100.0000 mg | ORAL_CAPSULE | Freq: Two times a day (BID) | ORAL | Status: DC
  Administered 2019-10-20 – 2019-10-24 (×9): 100 mg via ORAL
  Filled 2019-10-20 (×8): qty 1

## 2019-10-20 NOTE — ED Notes (Signed)
Previous in and out cath unsuccessful, previous RN reported resistance and unable to advance. Suction cath placed on pt at this time

## 2019-10-20 NOTE — ED Notes (Signed)
Pt requesting food, pt informed that he needs to remain on bipap at this time and can not currently have PO intake.

## 2019-10-20 NOTE — TOC Initial Note (Signed)
Transition of Care [facility]) - Initial/Assessment Note    Patient Details  Name: Douglas Mathis MRN: [id number] Date of Birth: 25-Jul-1935  [facility] CM/SW Contact:    [place] Cellar, RN Phone Number: 10/20/2019, 3:24 PM  Clinical Narrative:                 Spoke to patient at bedside. Difficult to determine orientation due to patient is difficult to understand. Patient states he lives with his daughter Douglas Mathis and she takes care of him. Patient is agreeable to short term rehab if needed but does not want to go anywhere for long term. According to previous notes patient was unable to afford copays for SNF. May possibly benefit from [facility] at discharge if unable to work out SNF. TOC will follow .  Expected Discharge Plan: Home w Home Health Services Barriers to Discharge: Continued Medical Work up   Patient Goals and CMS Choice Patient states their goals for this hospitalization and ongoing recovery are:: Feel better      Expected Discharge Plan and Services Expected Discharge Plan: Home w Home Health Services       Living arrangements for the past 2 months: Single Family Home                                      Prior Living Arrangements/Services Living arrangements for the past 2 months: Single Family Home Lives with:: Adult Children Patient language and need for interpreter reviewed:: Yes Do you feel safe going back to the place where you live?: Yes      Need for Family Participation in Patient Care: Yes (Comment) Care giver support system in place?: Yes (comment) Current home services: DME (walker, [facility]) Criminal Activity/Legal Involvement Pertinent to Current Situation/Hospitalization: No - Comment as needed  Activities of Daily Living      Permission Sought/Granted Permission sought to share information with : Family Supports Permission granted to share information with : Yes, Verbal Permission Granted  Share Information with NAME: Douglas Mathis            Emotional Assessment Appearance:: Appears older than stated age Attitude/Demeanor/Rapport: Engaged Affect (typically observed): Accepting Orientation: : Oriented to Self Alcohol / Substance Use: Not Applicable Psych Involvement: No (comment)  Admission diagnosis:  Acute on chronic respiratory failure with hypoxemia (HCC) [J96.21] Patient Active Problem List   Diagnosis Date Noted  . Acute on chronic respiratory failure with hypoxemia (HCC) 10/20/2019  . Acute metabolic encephalopathy   . Acute on chronic respiratory failure with hypoxia (HCC) 10/17/2019  . Spinal fracture of T3 vertebra (HCC) 10/07/2019  . Acute lower UTI 10/07/2019  . Conjunctivitis 10/07/2019  . Sepsis (HCC) 10/07/2019  . AMS (altered mental status) 10/06/2019  . Dehydration with hyponatremia   . COPD with acute bronchitis (HCC) 06/25/2019  . GERD (gastroesophageal reflux disease) 06/25/2019  . BPH (benign prostatic hyperplasia) 06/25/2019  . Hypotension   . Acute on chronic respiratory failure with hypercapnia (HCC)   . Hyponatremia   . Fall   . Generalized weakness   . Syncope   . Acute respiratory failure with hypoxia and hypercarbia (HCC) 06/03/2019  . Shock (HCC) 06/03/2019  . Acute on chronic diastolic CHF (congestive heart failure) (HCC) 03/31/2018  . HTN (hypertension) 03/31/2018  . Eyelid edema, left 03/31/2018  . COPD (chronic obstructive pulmonary disease) (HCC) 02/26/2018  . COPD exacerbation (HCC) 10/25/2017  [facility]P:  [facility]:   [facility] - [place], [place] - [place] - [address] [place] [address] Phone: [phone] Fax: [phone]  [facility] - [place], [place] - [address] [place] [address] Phone: [phone] Fax: [phone]     Social Determinants of Health (SDOH) Interventions    Readmission Risk Interventions No flowsheet data found.

## 2019-10-20 NOTE — TOC Progression Note (Addendum)
Transition of Care [facility]) - Progression Note    Patient Details  Name: Douglas Mathis MRN: [id number] Date of Birth: 1935/11/23  Transition of Care [facility]) CM/SW Contact  [place] Cellar, RN Phone Number: 10/20/2019, 4:24 PM  Clinical Narrative:     Spoke with daughter, Douglas Mathis, who confirmed family is requesting LTC placement under Medicaid services as they are unable to continue caring for patient at home. Patient has had both COVID vaccines. Family prefers [facility], [address] [place], [facility] or [place]. Does not want [facility] or [facility]. Daughter confirmed they have used up all 20 days of SNF and can no longer afford SNF placement.   PASRR completed-[phone] A FL2 pending MD signature.    Expected Discharge Plan: Home w Home Health Services Barriers to Discharge: Continued Medical Work up  Expected Discharge Plan and Services Expected Discharge Plan: Home w Home Health Services       Living arrangements for the past 2 months: Single Family Home                                       Social Determinants of Health (SDOH) Interventions    Readmission Risk Interventions No flowsheet data found.

## 2019-10-20 NOTE — ED Notes (Signed)
Bipap documentation not performed by this RN. Patient not on Bipap.

## 2019-10-20 NOTE — ED Triage Notes (Signed)
Patient presents to Emergency Department via [facility] from [facility] with complaints of being altered (per daughter pt is usually up by this time and making coffee) pt also noted to be SOB. Pt normally on 3 lpm [place] and EMS found pt at 75% and started NRB  Pt at 84% on 3 lpm [place], moved to 6 lpm   Pt has significant UTI hx, congested cough and ronchi heard throughout bases

## 2019-10-20 NOTE — H&P (Signed)
[facility] -  at [facility]   PATIENT NAME: Douglas Mathis    MR#:  [id number]  DATE OF BIRTH:  Jul 07, 1935  DATE OF ADMISSION:  10/20/2019  PRIMARY CARE PHYSICIAN: [facility]   REQUESTING/REFERRING PHYSICIAN: Dr. Juliette Alcide  Patient coming from : home lives with daughter Douglas Mathis ambulates very little on chronic oxygen 3 L nasal cannula  CHIEF COMPLAINT:   Lethargic, unable to wake up, shortness of breath  History is obtained from daughter Douglas Mathis. Patient is currently on BiPAP unable to give any history review of systems he is awake able to say a few words but difficult to understand due to his BiPAP HISTORY OF PRESENT ILLNESS:  Douglas Mathis  is a 84 y.o. male with a known history of chronic diastolic congestive heart failure, hypertension, chronic respiratory failure with COPD on chronically 3 L of oxygen, chronic hypercapnia, BPH comes to the emergency room with lethargic, poor oxygen saturation at home in the 70s according to the daughter and shortness of breath  ED course: afebrile tachypnea heart rate in the 80s blood pressure 134/62 sats were 93% on 6 L however patient was very lethargic and placed on BiPAP by Dr. Juliette Alcide chest x-ray showed pulmonary vascular congestion BNP was 302 venous CO2 100-- placed on BiPAP-- ABG showed CO2 of 69.  Patient is being admitted with acute on chronic hypoxic respiratory failure secondary to acute on chronic diastolic heart failure with chronic respiratory failure due to COPD and hypercapnia.  PAST MEDICAL HISTORY:   Past Medical History:  Diagnosis Date  . Anemia   . BPH (benign prostatic hyperplasia)   . CHF (congestive heart failure) (HCC)   . COPD (chronic obstructive pulmonary disease) (HCC)   . Hypercholesterolemia   . Hypertension     PAST SURGICAL HISTOIRY:   Past Surgical History:  Procedure Laterality Date  . cataracts    . ESOPHAGOGASTRODUODENOSCOPY N/A 07/10/2015    Procedure: ESOPHAGOGASTRODUODENOSCOPY (EGD);  Surgeon: Scot Jun, MD;  Location: [facility] ENDOSCOPY;  Service: Endoscopy;  Laterality: N/A;  . ESOPHAGOGASTRODUODENOSCOPY N/A 01/18/2018   Procedure: ESOPHAGOGASTRODUODENOSCOPY (EGD);  Surgeon: Toledo, Boykin Nearing, MD;  Location: [facility] ENDOSCOPY;  Service: Gastroenterology;  Laterality: N/A;    SOCIAL HISTORY:   Social History   Tobacco Use  . Smoking status: Current Every Day Smoker    Packs/day: 0.50  . Smokeless tobacco: Never Used  Substance Use Topics  . Alcohol use: Not Currently    Comment: used to drink a bottle on the weekend with friends    FAMILY HISTORY:   Family History  Problem Relation Age of Onset  . Diabetes Mother   . CAD Mother   . Bone cancer Brother   . Bronchitis Father     DRUG ALLERGIES:  No Known Allergies  REVIEW OF SYSTEMS:  Review of Systems  Unable to perform ROS: Mental status change     MEDICATIONS AT HOME:   Prior to Admission medications   Medication Sig Start Date End Date Taking? Authorizing Provider  amLODipine (NORVASC) 10 MG tablet Take 10 mg by mouth daily. 10/02/19  Yes [provider]  Ascorbic Acid (VITAMIN C) 500 MG CAPS Take 500 mg by mouth 2 (two) times daily.  08/21/19  Yes [provider]  carvedilol (COREG) 6.25 MG tablet Take 6.25 mg by mouth 2 (two) times daily with a meal.   Yes [provider]  DOK 100 MG capsule Take 100 mg by mouth 2 (two)  times daily. 06/23/19  Yes [provider]  feeding supplement, ENSURE ENLIVE, (ENSURE ENLIVE) LIQD Take 237 mLs by mouth 3 (three) times daily between meals. 06/29/19  Yes Enedina Finner, MD  ferrous sulfate 325 (65 FE) MG tablet Take 1 tablet (325 mg total) by mouth 2 (two) times daily with a meal. 07/22/15  Yes Alford Highland, MD  Fluticasone-Umeclidin-Vilant 100-62.5-25 MCG/INH AEPB Inhale 1 puff into the lungs daily. 12/13/17  Yes [provider]  furosemide (LASIX) 40 MG tablet Take 40 mg  by mouth 2 (two) times daily. 10/02/19  Yes [provider]  ipratropium-albuterol (DUONEB) 0.5-2.5 (3) MG/3ML SOLN Take 3 mLs by nebulization every 6 (six) hours as needed (shortness of breath or wheezing).  06/23/19  Yes [provider]  montelukast (SINGULAIR) 10 MG tablet Take 10 mg by mouth daily. 10/22/17  Yes [provider]  Multiple Vitamin (MULTIVITAMIN WITH MINERALS) TABS tablet Take 1 tablet by mouth daily. 06/29/19  Yes Enedina Finner, MD  pantoprazole (PROTONIX) 40 MG tablet Take 40 mg by mouth daily.  12/08/17  Yes [provider]  PROAIR HFA 108 (90 Base) MCG/ACT inhaler Inhale 2 puffs into the lungs 4 (four) times daily as needed for wheezing or shortness of breath.  08/04/17  Yes [provider]  tamsulosin (FLOMAX) 0.4 MG CAPS capsule Take 0.4 mg by mouth daily after breakfast.    Yes [provider]      VITAL SIGNS:  Blood pressure 123/63, pulse 74, temperature 98.2 F (36.8 C), temperature source Oral, resp. rate 20, height 5' (1.524 m), weight 72.2 kg, SpO2 100 %.  PHYSICAL EXAMINATION:  GENERAL:  84 y.o.-year-old patient lying in the bed with no acute distress. Obese chronically ill limited exam HEENT: Head atraumatic, normocephalic. Oropharynx and nasopharynx clear.   LUNGS: decreased breath sounds bilaterally, no wheezing, rales,rhonchi or crepitation. No use of accessory muscles of respiration. BiPAP CARDIOVASCULAR: S1, S2 normal. No murmurs, rubs, or gallops.  ABDOMEN: Soft, nontender, nondistended. Bowel sounds present. No organomegaly or mass.  EXTREMITIES: No pedal edema, cyanosis, or clubbing.  NEUROLOGIC: moves all extremities spontaneously. Grossly nonfocal  PSYCHIATRIC: The patient is alert on verbal commands however unable to communicate much due to BiPAP. Currently more awake then before SKIN: No obvious rash, lesion, or ulcer. -- Per RN  LABORATORY PANEL:   CBC Recent Labs  Lab 10/20/19 0629  WBC 13.9*   HGB 8.4*  HCT 27.8*  PLT 345   ------------------------------------------------------------------------------------------------------------------  Chemistries  Recent Labs  Lab 10/20/19 0629  NA 144  K 4.7  CL 87*  CO2 43*  GLUCOSE 180*  BUN 16  CREATININE 0.93  CALCIUM 9.2  AST 31  ALT 24  ALKPHOS 60  BILITOT 0.8   ------------------------------------------------------------------------------------------------------------------  Cardiac Enzymes No results for input(s): TROPONINI in the last 168 hours. ------------------------------------------------------------------------------------------------------------------  RADIOLOGY:  DG Chest Port 1 View  Result Date: 10/20/2019 CLINICAL DATA:  Acute dyspnea.  Hypoxia. EXAM: PORTABLE CHEST 1 VIEW COMPARISON:  CT 10/13/2019.  Chest x-ray 10/06/2019. FINDINGS: Mediastinum and hilar structures normal. Stable cardiomegaly. Diffuse bilateral pulmonary infiltrates/edema noted on today's exam. Small bilateral pleural effusions. No pneumothorax. IMPRESSION: 1. Diffuse bilateral pulmonary infiltrates/edema noted on today's exam. Small bilateral pleural effusions also noted. 2.  Stable cardiomegaly Electronically Signed   By: Maisie Fus  Register   On: 10/20/2019 07:02    EKG:    IMPRESSION AND PLAN:   Douglas Mathis  is a 84 y.o. male with a known history of chronic  diastolic congestive heart failure, hypertension, chronic respiratory failure with COPD on chronically 3 L of oxygen, chronic hypercapnia, BPH comes to the emergency room with lethargic, poor oxygen saturation at home in the 70s according to the daughter and shortness of breath  Acute metabolic encephalopathy suspected due to hypoxia and chronic hypercapnia in the setting of acute on chronic diastolic heart failure and COPD exacerbation -admit to step down -IV Lasix 40 mg BID -wean BiPAP of to nasal cannula keep sats greater than 92% -IV Solu-Medrol 60 mg daily wean to  oral -duo nebs as needed -monitor input output, metabolic panel -patient on chronic 3 L nasal cannula at home  Chronic respiratory failure with COPD chronic hypercapnia -old record shows CO2 anywhere from 72-92 -patient more awake at present -wean off BiPAP place on nasal cannula oxygen -PRN bronchodilators  Hypertension continue amlodipine lisinopril on Coreg  History of recurrent falls -patient was assessed by PT during last admission. Recommended rehab. Daughter took patient home since not able to afford co-pay for the facility -TOC for discharge planning -PT OT to see patient  Chronic anemia hemoglobin stable at 8.4  Family Communication : Douglas Mathis daughter on the phone Consults : Code Status : DNR prior to admission patient care is out of facility yellow DNR form DVT prophylaxis : Lovenox admission status inpatient  TOTAL TIME TAKING CARE OF THIS PATIENT: *55** minutes.    Enedina Finner M.D  [facility]     CC: Primary care physician; [facility]

## 2019-10-20 NOTE — Evaluation (Signed)
Physical Therapy Evaluation Patient Details Name: Douglas Mathis MRN: [id number] DOB: September 26, 1935 Today's Date: 10/20/2019   History of Present Illness  Per MD notes: Pt is an 84 y.o. male with a known history of chronic diastolic congestive heart failure, hypertension, chronic respiratory failure with COPD on chronically 3 L of oxygen, chronic hypercapnia, and BPH comes to the emergency room lethargic, poor oxygen saturation at home in the 70s according to the daughter and shortness of breath.  MD assessment includes:  Acute metabolic encephalopathy suspected due to hypoxia and chronic hypercapnia in the setting of acute on chronic diastolic heart failure and COPD exacerbation, chronic respiratory failure with COPD chronic hypercapnia, history of recurrent falls, and chronic anemia.    Clinical Impression  Pt was pleasant and motivated to participate during the session.  Pt required min A with all functional tasks secondary to decreased functional strength and/or poor stability.  Once in standing the pt was only able to amb a max of 2-3 small steps near the EOB before fatiguing and requiring to return to sitting.  Pt's SpO2 remained in the low 90s on 5LO2/min with no adverse symptoms reported during mobility other than fatigue.  Pt would not be safe to return to his prior living situation at this time secondary to significant deficits in functional strength and stability putting patient at very high risk for falls.  Pt will benefit from PT services in a SNF setting upon discharge to safely address deficits listed in patient problem list for decreased caregiver assistance and eventual return to PLOF.       Follow Up Recommendations SNF    Equipment Recommendations  Rolling walker with 5" wheels (Pt stated only owns a rollator, would benefit from a RW)    Recommendations for Other Services       Precautions / Restrictions Precautions Precautions: Fall Restrictions Weight Bearing  Restrictions: No      Mobility  Bed Mobility Overal bed mobility: Needs Assistance Bed Mobility: Supine to Sit;Sit to Supine     Supine to sit: Min assist Sit to supine: Min assist   General bed mobility comments: Min A for BLE and trunk control  Transfers Overall transfer level: Needs assistance Equipment used: Rolling walker (2 wheeled) Transfers: Sit to/from Stand Sit to Stand: Min assist;From elevated surface         General transfer comment: Mod verbal cues for sequencing and min A to come to full standing and for stability upon initial stand  Ambulation/Gait Ambulation/Gait assistance: Min assist Gait Distance (Feet): 2 Feet Assistive device: Rolling walker (2 wheeled) Gait Pattern/deviations: Trunk flexed;Narrow base of support;Decreased step length - right;Decreased step length - left Gait velocity: decreased   General Gait Details: Pt only able to take a max of 2-3 small steps at the EOB before requiring to return to sitting secondary to fatigue; min A for stability to prevent posterior LOB  Stairs            Wheelchair Mobility    Modified Rankin (Stroke Patients Only)       Balance Overall balance assessment: Needs assistance Sitting-balance support: Feet supported Sitting balance-Leahy Scale: Fair     Standing balance support: Bilateral upper extremity supported;During functional activity Standing balance-Leahy Scale: Poor Standing balance comment: Min A for stability during limited ambulation                             Pertinent Vitals/Pain Pain Assessment: No/denies  pain    Home Living Family/patient expects to be discharged to:: Private residence Living Arrangements: Alone Available Help at Discharge: Family;Available PRN/intermittently Type of Home: Apartment Home Access: Elevator;Level entry     Home Layout: One level Home Equipment: Walker - 4 wheels Additional Comments: Pt has three daughters that take turns  checking on the patient intermittently    Prior Function Level of Independence: Needs assistance   Gait / Transfers Assistance Needed: Mod Ind amb limited community distances with a rollator, 4 falls in the last 6 months secondary to LOB  ADL's / Homemaking Assistance Needed: Daughters assist occasionally with ADLs, transportation, and errands        Hand Dominance        Extremity/Trunk Assessment   Upper Extremity Assessment Upper Extremity Assessment: Generalized weakness    Lower Extremity Assessment Lower Extremity Assessment: Generalized weakness       Communication   Communication: Other (comment) (Pt difficult to understand)  Cognition Arousal/Alertness: Awake/alert Behavior During Therapy: WFL for tasks assessed/performed Overall Cognitive Status: No family/caregiver present to determine baseline cognitive functioning                                 General Comments: Pt difficult to understand but followed simple commands consistently      General Comments      Exercises Total Joint Exercises Ankle Circles/Pumps: AROM;Strengthening;10 reps Quad Sets: Strengthening;Both;10 reps Gluteal Sets: Strengthening;Both;10 reps Heel Slides: Strengthening;Both;10 reps Hip ABduction/ADduction: Strengthening;Both;10 reps Straight Leg Raises: Strengthening;Both;10 reps Long Arc Quad: Strengthening;Both;10 reps Other Exercises Other Exercises: HEP education for BLE APs, QS, and GS x 10 each every 1-2 hours daily   Assessment/Plan    PT Assessment Patient needs continued PT services  PT Problem List Decreased strength;Decreased balance;Decreased activity tolerance;Decreased mobility;Decreased knowledge of use of DME       PT Treatment Interventions DME instruction;Gait training;Functional mobility training;Therapeutic activities;Therapeutic exercise;Balance training;Patient/family education    PT Goals (Current goals can be found in the Care Plan  section)  Acute Rehab PT Goals Patient Stated Goal: To get stronger PT Goal Formulation: With patient Time For Goal Achievement: 11/02/19 Potential to Achieve Goals: Fair    Frequency Min 2X/week   Barriers to discharge Inaccessible home environment;Decreased caregiver support      Co-evaluation               AM-PAC PT "6 Clicks" Mobility  Outcome Measure Help needed turning from your back to your side while in a flat bed without using bedrails?: A Little Help needed moving from lying on your back to sitting on the side of a flat bed without using bedrails?: A Little Help needed moving to and from a bed to a chair (including a wheelchair)?: A Little Help needed standing up from a chair using your arms (e.g., wheelchair or bedside chair)?: A Little Help needed to walk in hospital room?: Total Help needed climbing 3-5 steps with a railing? : Total 6 Click Score: 14    End of Session Equipment Utilized During Treatment: Gait belt;Oxygen Activity Tolerance: Patient limited by fatigue Patient left: in bed;with call bell/phone within reach Nurse Communication: Mobility status PT Visit Diagnosis: Unsteadiness on feet (R26.81);Muscle weakness (generalized) (M62.81);History of falling (Z91.81);Difficulty in walking, not elsewhere classified (R26.2)    Time: 4709-6283 PT Time Calculation (min) (ACUTE ONLY): 26 min   Charges:   PT Evaluation $PT Eval Moderate Complexity: 1 Mod PT Treatments $  Therapeutic Exercise: 8-22 mins        D. Elly Modena PT, DPT 10/20/19, 4:22 PM

## 2019-10-20 NOTE — NC FL2 (Signed)
[place] MEDICAID FL2 LEVEL OF CARE SCREENING TOOL     IDENTIFICATION  Patient Name: Douglas Mathis Birthdate: [birth year] Sex: male Admission Date (Current Location): 10/20/2019  [place] and [place] Number:  Chiropodist and Address:  [facility], [address] [place], [place] [address]      Provider Number: [id number]  Attending Physician Name and Address:  Enedina Finner, MD  Relative Name and Phone Number:  [name] [phone]    Current Level of Care: Hospital Recommended Level of Care: Other (Comment), Skilled Nursing Facility (Long Term Care) Prior Approval Number:    Date Approved/Denied:   PASRR Number: [id number]  Discharge Plan: SNF    Current Diagnoses: Patient Active Problem List   Diagnosis Date Noted  . Acute on chronic respiratory failure with hypoxemia (HCC) 10/20/2019  . Acute metabolic encephalopathy   . Acute on chronic respiratory failure with hypoxia (HCC) 10/17/2019  . Spinal fracture of T3 vertebra (HCC) 10/07/2019  . Acute lower UTI 10/07/2019  . Conjunctivitis 10/07/2019  . Sepsis (HCC) 10/07/2019  . AMS (altered mental status) 10/06/2019  . Dehydration with hyponatremia   . COPD with acute bronchitis (HCC) 06/25/2019  . GERD (gastroesophageal reflux disease) 06/25/2019  . BPH (benign prostatic hyperplasia) 06/25/2019  . Hypotension   . Acute on chronic respiratory failure with hypercapnia (HCC)   . Hyponatremia   . Fall   . Generalized weakness   . Syncope   . Acute respiratory failure with hypoxia and hypercarbia (HCC) 06/03/2019  . Shock (HCC) 06/03/2019  . Acute on chronic diastolic CHF (congestive heart failure) (HCC) 03/31/2018  . HTN (hypertension) 03/31/2018  . Eyelid edema, left 03/31/2018  . COPD (chronic obstructive pulmonary disease) (HCC) 02/26/2018  . COPD exacerbation (HCC) 10/25/2017    Orientation RESPIRATION BLADDER Height & Weight     Self, Time, Situation, Place  O2 (2 L  at baseline) Continent Weight: 72.2 kg (weight from bed. pt unable to stand at this time due to bipap use) Height:  5' (152.4 cm)  BEHAVIORAL SYMPTOMS/MOOD NEUROLOGICAL BOWEL NUTRITION STATUS      Continent Diet (Dysphagia)  AMBULATORY STATUS COMMUNICATION OF NEEDS Skin   Extensive Assist Verbally Normal                       Personal Care Assistance Level of Assistance  Bathing, Feeding, Dressing Bathing Assistance: Limited assistance Feeding assistance: Limited assistance Dressing Assistance: Limited assistance     Functional Limitations Info  Sight, Hearing, Speech Sight Info: Adequate Hearing Info: Adequate Speech Info: Adequate    SPECIAL CARE FACTORS FREQUENCY  PT (By licensed PT), OT (By licensed OT)     PT Frequency: min 5xweek OT Frequency: min 5xweek            Contractures Contractures Info: Not present    Additional Factors Info    Code Status Info: DNR Allergies Info: NKA           Current Medications (10/20/2019):  This is the current hospital active medication list Current Facility-Administered Medications  Medication Dose Route Frequency Provider Last Rate Last Admin  . 0.9 %  sodium chloride infusion  250 mL Intravenous PRN Enedina Finner, MD      . acetaminophen (TYLENOL) tablet 650 mg  650 mg Oral Q4H PRN Enedina Finner, MD   650 mg at 10/20/19 1456  . albuterol (PROVENTIL) (2.5 MG/3ML) 0.083% nebulizer solution 3 mL  3 mL Inhalation QID PRN Allena Katz,  Sona, MD      . amLODipine (NORVASC) tablet 10 mg  10 mg Oral Daily Enedina Finner, MD   10 mg at 10/20/19 1511  . carvedilol (COREG) tablet 6.25 mg  6.25 mg Oral BID WC Enedina Finner, MD      . docusate sodium (COLACE) capsule 100 mg  100 mg Oral BID Enedina Finner, MD   100 mg at 10/20/19 1511  . enalapril (VASOTEC) tablet 2.5 mg  2.5 mg Oral Daily Enedina Finner, MD   2.5 mg at 10/20/19 1457  . enoxaparin (LOVENOX) injection 40 mg  40 mg Subcutaneous Q24H Enedina Finner, MD   40 mg at 10/20/19 1458  . feeding  supplement (ENSURE ENLIVE) (ENSURE ENLIVE) liquid 237 mL  237 mL Oral TID BM Enedina Finner, MD      . ferrous sulfate tablet 325 mg  325 mg Oral BID WC Enedina Finner, MD      . fluticasone furoate-vilanterol (BREO ELLIPTA) 100-25 MCG/INH 1 puff  1 puff Inhalation Daily Enedina Finner, MD   1 puff at 10/20/19 1501  . [START ON 10/21/2019] furosemide (LASIX) injection 40 mg  40 mg Intravenous Q12H Enedina Finner, MD      . ipratropium-albuterol (DUONEB) 0.5-2.5 (3) MG/3ML nebulizer solution 3 mL  3 mL Nebulization Q6H PRN Enedina Finner, MD      . methylPREDNISolone sodium succinate (SOLU-MEDROL) 125 mg/2 mL injection 60 mg  60 mg Intravenous Q24H Enedina Finner, MD   60 mg at 10/20/19 1222  . montelukast (SINGULAIR) tablet 10 mg  10 mg Oral Daily Enedina Finner, MD   10 mg at 10/20/19 1457  . multivitamin with minerals tablet 1 tablet  1 tablet Oral Daily Enedina Finner, MD   1 tablet at 10/20/19 1511  . ondansetron (ZOFRAN) injection 4 mg  4 mg Intravenous Q6H PRN Enedina Finner, MD      . pantoprazole (PROTONIX) EC tablet 40 mg  40 mg Oral Daily Enedina Finner, MD   40 mg at 10/20/19 1511  . sodium chloride flush (NS) 0.9 % injection 3 mL  3 mL Intravenous Q12H Enedina Finner, MD   3 mL at 10/20/19 1512  . sodium chloride flush (NS) 0.9 % injection 3 mL  3 mL Intravenous PRN Enedina Finner, MD      . Melene Muller ON 10/21/2019] tamsulosin (FLOMAX) capsule 0.4 mg  0.4 mg Oral QPC breakfast Enedina Finner, MD      . umeclidinium bromide (INCRUSE ELLIPTA) 62.5 MCG/INH 1 puff  1 puff Inhalation Daily Enedina Finner, MD   1 puff at 10/20/19 1502  . vitamin C (ASCORBIC ACID) tablet 500 mg  500 mg Oral BID Enedina Finner, MD   500 mg at 10/20/19 1511   Current Outpatient Medications  Medication Sig Dispense Refill  . amLODipine (NORVASC) 10 MG tablet Take 10 mg by mouth daily.    . Ascorbic Acid (VITAMIN C) 500 MG CAPS Take 500 mg by mouth 2 (two) times daily.     . carvedilol (COREG) 6.25 MG tablet Take 6.25 mg by mouth 2 (two) times daily with a  meal.    . DOK 100 MG capsule Take 100 mg by mouth 2 (two) times daily.    . feeding supplement, ENSURE ENLIVE, (ENSURE ENLIVE) LIQD Take 237 mLs by mouth 3 (three) times daily between meals. 237 mL 12  . ferrous sulfate 325 (65 FE) MG tablet Take 1 tablet (325 mg total) by mouth 2 (two) times daily with a meal. 60  tablet 0  . Fluticasone-Umeclidin-Vilant 100-62.5-25 MCG/INH AEPB Inhale 1 puff into the lungs daily.    . furosemide (LASIX) 40 MG tablet Take 40 mg by mouth 2 (two) times daily.    Marland Kitchen ipratropium-albuterol (DUONEB) 0.5-2.5 (3) MG/3ML SOLN Take 3 mLs by nebulization every 6 (six) hours as needed (shortness of breath or wheezing).     . montelukast (SINGULAIR) 10 MG tablet Take 10 mg by mouth daily.    . Multiple Vitamin (MULTIVITAMIN WITH MINERALS) TABS tablet Take 1 tablet by mouth daily.    . pantoprazole (PROTONIX) 40 MG tablet Take 40 mg by mouth daily.     Marland Kitchen PROAIR HFA 108 (90 Base) MCG/ACT inhaler Inhale 2 puffs into the lungs 4 (four) times daily as needed for wheezing or shortness of breath.   3  . tamsulosin (FLOMAX) 0.4 MG CAPS capsule Take 0.4 mg by mouth daily after breakfast.        Discharge Medications: Please see discharge summary for a list of discharge medications.  Relevant Imaging Results:  Relevant Lab Results:   Additional Information SS# [id number] Seeking LTC placement  [place] Cellar, RN

## 2019-10-20 NOTE — ED Provider Notes (Signed)
[facility] Emergency Department Provider Note   ____________________________________________   First MD Initiated Contact with Patient 10/20/19 [phone]     (approximate)  I have reviewed the triage vital signs and the nursing notes.   HISTORY  Chief Complaint Altered Mental Status and Respiratory Distress    HPI Douglas Mathis is a 84 y.o. male who comes from [facility].  Per his daughter he is usually up and making breakfast and coffee and today he was not.  He was short of breath.  He is normally on 3 L and he was found to be 75% when he was put on 3 L he only went up to 84%.  At 6 L he is 9899%.  He complains of a cough to me.  He is not running a fever.  He is breathing fast but otherwise in no distress.  We are uncertain when he got sick this time but he was recently discharged from the hospital on the 24th after having multiple falls worsening hyponatremia and fracture of his transverse process of the spine         Past Medical History:  Diagnosis Date  . Anemia   . BPH (benign prostatic hyperplasia)   . CHF (congestive heart failure) (HCC)   . COPD (chronic obstructive pulmonary disease) (HCC)   . Hypercholesterolemia   . Hypertension     Patient Active Problem List   Diagnosis Date Noted  . Acute on chronic respiratory failure with hypoxia (HCC) 10/17/2019  . Spinal fracture of T3 vertebra (HCC) 10/07/2019  . Acute lower UTI 10/07/2019  . Conjunctivitis 10/07/2019  . Sepsis (HCC) 10/07/2019  . AMS (altered mental status) 10/06/2019  . Dehydration with hyponatremia   . COPD with acute bronchitis (HCC) 06/25/2019  . GERD (gastroesophageal reflux disease) 06/25/2019  . BPH (benign prostatic hyperplasia) 06/25/2019  . Hypotension   . Acute on chronic respiratory failure with hypercapnia (HCC)   . Hyponatremia   . Fall   . Generalized weakness   . Syncope   . Acute respiratory failure with hypoxia and hypercarbia (HCC)  06/03/2019  . Shock (HCC) 06/03/2019  . Acute on chronic diastolic (congestive) heart failure (HCC) 03/31/2018  . HTN (hypertension) 03/31/2018  . Eyelid edema, left 03/31/2018  . COPD (chronic obstructive pulmonary disease) (HCC) 02/26/2018  . COPD exacerbation (HCC) 10/25/2017    Past Surgical History:  Procedure Laterality Date  . cataracts    . ESOPHAGOGASTRODUODENOSCOPY N/A 07/10/2015   Procedure: ESOPHAGOGASTRODUODENOSCOPY (EGD);  Surgeon: Scot Jun, MD;  Location: [facility] ENDOSCOPY;  Service: Endoscopy;  Laterality: N/A;  . ESOPHAGOGASTRODUODENOSCOPY N/A 01/18/2018   Procedure: ESOPHAGOGASTRODUODENOSCOPY (EGD);  Surgeon: Toledo, Boykin Nearing, MD;  Location: [facility] ENDOSCOPY;  Service: Gastroenterology;  Laterality: N/A;    Prior to Admission medications   Medication Sig Start Date End Date Taking? Authorizing Provider  amLODipine (NORVASC) 10 MG tablet Take 10 mg by mouth daily. 10/02/19   [provider]  Ascorbic Acid (VITAMIN C) 500 MG CAPS Take 1 capsule by mouth 2 (two) times daily. 08/21/19   [provider]  carvedilol (COREG) 6.25 MG tablet Take 1 tablet (6.25 mg total) by mouth 2 (two) times daily. 03/22/18 10/06/19  Pyreddy, Vivien Rota, MD  DOK 100 MG capsule Take 100 mg by mouth 2 (two) times daily. 06/23/19   [provider]  feeding supplement, ENSURE ENLIVE, (ENSURE ENLIVE) LIQD Take 237 mLs by mouth 3 (three) times daily between meals. 06/29/19   Enedina Finner, MD  ferrous sulfate 325 (65 FE) MG tablet Take 1 tablet (325 mg total) by mouth 2 (two) times daily with a meal. 07/22/15   Alford Highland, MD  Fluticasone-Umeclidin-Vilant 100-62.5-25 MCG/INH AEPB Inhale 1 puff into the lungs daily. 12/13/17   [provider]  furosemide (LASIX) 40 MG tablet Take 40 mg by mouth 2 (two) times daily. 10/02/19   [provider]  ipratropium-albuterol (DUONEB) 0.5-2.5 (3) MG/3ML SOLN Take 3 mLs by nebulization every 6 (six) hours as needed. 06/23/19    [provider]  montelukast (SINGULAIR) 10 MG tablet Take 10 mg by mouth daily. 10/22/17   [provider]  Multiple Vitamin (MULTIVITAMIN WITH MINERALS) TABS tablet Take 1 tablet by mouth daily. 06/29/19   Enedina Finner, MD  pantoprazole (PROTONIX) 40 MG tablet Take 40 mg by mouth daily.  12/08/17   [provider]  PROAIR HFA 108 (90 Base) MCG/ACT inhaler Inhale 2 puffs into the lungs 4 (four) times daily as needed for wheezing or shortness of breath.  08/04/17   [provider]  tamsulosin (FLOMAX) 0.4 MG CAPS capsule Take 0.4 mg by mouth daily after breakfast.     [provider]    Allergies Patient has no known allergies.  Family History  Problem Relation Age of Onset  . Diabetes Mother   . CAD Mother   . Bone cancer Brother   . Bronchitis Father     Social History Social History   Tobacco Use  . Smoking status: Current Every Day Smoker    Packs/day: 0.50  . Smokeless tobacco: Never Used  Substance Use Topics  . Alcohol use: Not Currently    Comment: used to drink a bottle on the weekend with friends  . Drug use: Never    Review of Systems  Constitutional: No fever/chills Eyes: No visual changes. ENT: No sore throat. Cardiovascular: Denies chest pain. Respiratory:  shortness of breath. Gastrointestinal: No abdominal pain.  No nausea, no vomiting.  No diarrhea.  No constipation. Genitourinary: Negative for dysuria. Musculoskeletal: Negative for back pain. Skin: Negative for rash. Neurological: Negative for headaches, focal weakness  ____________________________________________   PHYSICAL EXAM:  VITAL SIGNS: ED Triage Vitals  Enc Vitals Group     BP 10/20/19 0639 134/62     Pulse Rate 10/20/19 0639 89     Resp 10/20/19 0639 (!) 28     Temp 10/20/19 0639 98.2 F (36.8 C)     Temp Source 10/20/19 0639 Oral     SpO2 10/20/19 0639 93 %     Weight 10/20/19 0646 155 lb (70.3 kg)     Height 10/20/19 0646 5' (1.524 m)      Head Circumference --      Peak Flow --      Pain Score 10/20/19 0646 0     Pain Loc --      Pain Edu? --      Excl. in GC? --     Constitutional: Alert and oriented. Well appearing and in no acute distress but is breathing fast. Eyes: Conjunctivae are normal.  Head: Atraumatic. Nose: No congestion/rhinnorhea. Mouth/Throat: Mucous membranes are moist.  Oropharynx non-erythematous. Neck: No stridor.  Cardiovascular: Normal rate, regular rhythm. Grossly normal heart sounds.  Good peripheral circulation. Respiratory: Normal respiratory effort.  No retractions. Lungs CTAB!! Gastrointestinal: Soft and nontender. No distention. No abdominal bruits. No CVA tenderness. Musculoskeletal: No lower extremity tenderness nor edema.  . Neurologic:  Normal speech and language. No gross focal neurologic deficits  are appreciated.  Skin:  Skin is warm, dry and intact. No rash noted.  No bedsores on his back   ____________________________________________   LABS (all labs ordered are listed, but only abnormal results are displayed)  Labs Reviewed  SARS CORONAVIRUS 2 BY RT PCR (HOSPITAL ORDER, PERFORMED IN [facility])  CULTURE, BLOOD (SINGLE)  URINE CULTURE  LACTIC ACID, PLASMA  LACTIC ACID, PLASMA  COMPREHENSIVE METABOLIC PANEL  CBC WITH DIFFERENTIAL/PLATELET  PROTIME-INR  APTT  URINALYSIS, COMPLETE (UACMP) WITH MICROSCOPIC  LIPASE, BLOOD  BRAIN NATRIURETIC PEPTIDE  BLOOD GAS, VENOUS  PROCALCITONIN  TROPONIN I (HIGH SENSITIVITY)   ____________________________________________  EKG  EKG read interpreted by me shows normal sinus rhythm rate of 88 right bundle branch block and left anterior hemiblock there is 1 PVC as well.  EKG looks similar to prior elevated QRS duration slightly longer ____________________________________________  RADIOLOGY  ED MD interpretation: Chest x-ray read by radiology reviewed by me does show diffuse bilateral increased markings with apparent  fluid in the fissures radiology feels the cardiomegaly stable.  Official radiology report(s): DG Chest Port 1 View  Result Date: 10/20/2019 CLINICAL DATA:  Acute dyspnea.  Hypoxia. EXAM: PORTABLE CHEST 1 VIEW COMPARISON:  CT 10/13/2019.  Chest x-ray 10/06/2019. FINDINGS: Mediastinum and hilar structures normal. Stable cardiomegaly. Diffuse bilateral pulmonary infiltrates/edema noted on today's exam. Small bilateral pleural effusions. No pneumothorax. IMPRESSION: 1. Diffuse bilateral pulmonary infiltrates/edema noted on today's exam. Small bilateral pleural effusions also noted. 2.  Stable cardiomegaly Electronically Signed   By: Maisie Fus  Register   On: 10/20/2019 07:02    ____________________________________________   PROCEDURES  Procedure(s) performed (including Critical Care):  Procedures   ____________________________________________   INITIAL IMPRESSION / ASSESSMENT AND PLAN / ED COURSE  ----------------------------------------- 10:22 AM on 10/20/2019 -----------------------------------------  Patient had been getting sleepy on the oxygen at 6 L.  VBG showed a pH of 7.28 with a high CO2.  We put him on BiPAP and now he is much more awake.  We are waiting on the Covid test to come back but the machine is gone down.  It might be a few hours to get the report back.  We will have to admit this gentleman 1 where the other though.          Clinical Course as of Oct 19 1020  Fri Oct 20, 2019  [address] [facility]: 26.5 [PM]    Clinical Course User Index [PM] Arnaldo Natal, MD     ____________________________________________   FINAL CLINICAL IMPRESSION(S) / ED DIAGNOSES  Final diagnoses:  None     ED Discharge Orders    None       Note:  This document was prepared using Dragon voice recognition software and may include unintentional dictation errors.    Arnaldo Natal, MD 10/20/19 1630

## 2019-10-21 DIAGNOSIS — N400 Enlarged prostate without lower urinary tract symptoms: Secondary | ICD-10-CM

## 2019-10-21 DIAGNOSIS — I10 Essential (primary) hypertension: Secondary | ICD-10-CM

## 2019-10-21 DIAGNOSIS — J441 Chronic obstructive pulmonary disease with (acute) exacerbation: Secondary | ICD-10-CM

## 2019-10-21 DIAGNOSIS — K219 Gastro-esophageal reflux disease without esophagitis: Secondary | ICD-10-CM

## 2019-10-21 LAB — BASIC METABOLIC PANEL
Anion gap: 16 — ABNORMAL HIGH (ref 5–15)
BUN: 18 mg/dL (ref 8–23)
CO2: 38 mmol/L — ABNORMAL HIGH (ref 22–32)
Calcium: 8.6 mg/dL — ABNORMAL LOW (ref 8.9–10.3)
Chloride: 85 mmol/L — ABNORMAL LOW (ref 98–111)
Creatinine, Ser: 0.75 mg/dL (ref 0.61–1.24)
GFR calc Af Amer: >60 mL/min (ref >60)
GFR calc non Af Amer: >60 mL/min (ref >60)
Glucose, Bld: 149 mg/dL — ABNORMAL HIGH (ref 70–99)
Potassium: 4.3 mmol/L (ref 3.5–5.1)
Sodium: 139 mmol/L (ref 135–145)

## 2019-10-21 LAB — HEMOGLOBIN: Hemoglobin: 8.2 g/dL — ABNORMAL LOW (ref 13.0–17.0)

## 2019-10-21 MED ORDER — IPRATROPIUM-ALBUTEROL 0.5-2.5 (3) MG/3ML IN SOLN
3.0000 mL | Freq: Four times a day (QID) | RESPIRATORY_TRACT | Status: DC
  Administered 2019-10-21 – 2019-10-22 (×3): 3 mL via RESPIRATORY_TRACT
  Filled 2019-10-21 (×3): qty 3

## 2019-10-21 NOTE — ED Notes (Signed)
Patient changed and cleaned by this RN and Faith Rogue. Patient had large black tarry stool.

## 2019-10-21 NOTE — Evaluation (Signed)
Occupational Therapy Evaluation Patient Details Name: Douglas Mathis MRN: [id number] DOB: 10-05-1935 Today's Date: 10/21/2019    History of Present Illness Per MD notes: Pt is an 84 y.o. male with a known history of chronic diastolic congestive heart failure, hypertension, chronic respiratory failure with COPD on chronically 3 L of oxygen, chronic hypercapnia, and BPH comes to the emergency room lethargic, poor oxygen saturation at home in the 70s according to the daughter and shortness of breath.  MD assessment includes:  Acute metabolic encephalopathy suspected due to hypoxia and chronic hypercapnia in the setting of acute on chronic diastolic heart failure and COPD exacerbation, chronic respiratory failure with COPD chronic hypercapnia, history of recurrent falls, and chronic anemia.   Clinical Impression   Mr Weedman was seen for OT evaluation this date. Prior to hospital admission, pt was MOD I for ADLs and mobility using 4WW, assist from daughters PRN for IADLs. Pt lives alone in apartment. Pt presents to acute OT demonstrating impaired ADL performance and functional mobility 2/2 decreased LB access, functional strength/ROM deficits, and decreased activity toelrance. Pt currently requires MAX A don B socks at bed level. SBA sup<>sit, MIN A + RW sit<>stand from regular bed height, assist for lift off.  SETUP + SUPERVISION oral care seated EOB. MAX A + RW perihygiene in standing. Pt would benefit from skilled OT to address noted impairments and functional limitations (see below for any additional details) in order to maximize safety and independence while minimizing falls risk and caregiver burden. Upon hospital discharge, recommend STR to maximize pt safety and return to PLOF.     Follow Up Recommendations  SNF    Equipment Recommendations   (TBD )    Recommendations for Other Services       Precautions / Restrictions Precautions Precautions: Fall Restrictions Weight Bearing  Restrictions: No      Mobility Bed Mobility Overal bed mobility: Needs Assistance Bed Mobility: Supine to Sit;Sit to Supine   Supine to sit: Min guard Sit to supine: Min guard   General bed mobility comments: SBA + HoB elevated   Transfers Overall transfer level: Needs assistance Equipment used: Rolling walker (2 wheeled) Transfers: Sit to/from Stand Sit to Stand: Min assist;From elevated surface    General transfer comment: MIN A lift off from regular bed height     Balance Overall balance assessment: Needs assistance Sitting-balance support: Feet supported Sitting balance-Leahy Scale: Good     Standing balance support: Bilateral upper extremity supported Standing balance-Leahy Scale: Fair        ADL either performed or assessed with clinical judgement   ADL Overall ADL's : Needs assistance/impaired        General ADL Comments: MAX A don B socks at bed level. SETUP + SUPERVISION oral care seated EOB. MAX A + RW perihygiene in standing       Pertinent Vitals/Pain Pain Assessment: No/denies pain     Hand Dominance      Extremity/Trunk Assessment Upper Extremity Assessment Upper Extremity Assessment: Generalized weakness   Lower Extremity Assessment Lower Extremity Assessment: Generalized weakness       Communication Communication Communication: Other (comment) (garbled speech, understandable c repetition)   Cognition Arousal/Alertness: Awake/alert Behavior During Therapy: WFL for tasks assessed/performed Overall Cognitive Status: No family/caregiver present to determine baseline cognitive functioning      General Comments: disoriented to time/situation. Follows 1 step commands    General Comments  Unable to get O2 read on pulse ox - no complaint of SoB or  increased WoB noted on 5L [place]     Exercises Exercises: Other exercises Other Exercises Other Exercises: Pt educated re: OT role, DME recs, importance of mobility for functional strengthening,  falls prevention, ECS Other Exercises: LBD, toileting, sup<>sit, sit<>stand, sitting/standing balance/tolerance   Shoulder Instructions      Home Living Family/patient expects to be discharged to:: Private residence Living Arrangements: Alone Available Help at Discharge: Family;Available PRN/intermittently Type of Home: Apartment Home Access: Elevator;Level entry     Home Layout: One level     Bathroom Shower/Tub: Producer, television/film/video: Handicapped height     Home Equipment: Environmental consultant - 4 wheels   Additional Comments: Pt has three daughters that take turns checking on the patient intermittently      Prior Functioning/Environment Level of Independence: Needs assistance  Gait / Transfers Assistance Needed: Mod Ind amb limited community distances with a rollator, 4 falls in the last 6 months secondary to LOB ADL's / Homemaking Assistance Needed: Daughters assist occasionally with ADLs, transportation, and errands            OT Problem List: Decreased strength;Decreased activity tolerance;Impaired balance (sitting and/or standing);Decreased safety awareness;Decreased knowledge of use of DME or AE      OT Treatment/Interventions: Self-care/ADL training;Therapeutic exercise;Energy conservation;DME and/or AE instruction;Therapeutic activities;Patient/family education;Balance training    OT Goals(Current goals can be found in the care plan section) Acute Rehab OT Goals Patient Stated Goal: To get stronger OT Goal Formulation: With patient Time For Goal Achievement: 11/04/19 Potential to Achieve Goals: Good ADL Goals Pt Will Perform Grooming: with set-up;with supervision;standing (c LRAD PRN) Pt Will Perform Lower Body Dressing: with mod assist;sit to/from stand (c LRAD PRN) Pt Will Transfer to Toilet: with min assist;stand pivot transfer;bedside commode (c LRAD PRN)  OT Frequency: Min 1X/week   Barriers to D/C: Decreased caregiver support       AM-PAC OT "6  Clicks" Daily Activity     Outcome Measure Help from another person eating meals?: A Little Help from another person taking care of personal grooming?: A Little Help from another person toileting, which includes using toliet, bedpan, or urinal?: A Lot Help from another person bathing (including washing, rinsing, drying)?: A Lot Help from another person to put on and taking off regular upper body clothing?: A Little Help from another person to put on and taking off regular lower body clothing?: A Lot 6 Click Score: 15   End of Session Equipment Utilized During Treatment: Rolling walker;Oxygen  Activity Tolerance: Patient tolerated treatment well Patient left: in bed;with call bell/phone within reach;with bed alarm set  OT Visit Diagnosis: Other abnormalities of gait and mobility (R26.89);History of falling (Z91.81)                Time: 6160-7371 OT Time Calculation (min): 16 min Charges:  OT General Charges $OT Visit: 1 Visit OT Evaluation $OT Eval Moderate Complexity: 1 Mod OT Treatments $Self Care/Home Management : 8-22 mins  Kathie Dike, M.S. OTR/L  10/21/19, 10:45 AM  ascom [phone]

## 2019-10-21 NOTE — Progress Notes (Signed)
PROGRESS NOTE    Douglas Mathis  CBJ:[id number] DOB: 05-13-1935 DOA: 10/20/2019 PCP: [facility]    Brief Narrative:  Patient admitted to the hospital with a working diagnosis of acute on chronic hypoxic/hypercapnic diastolic heart failure exacerbation/ COPD exacerbation  84 year old male with a past medical history of chronic diastolic heart failure, hypertension, chronic hypoxic respiratory failure due to COPD, chronic hypercapnic respiratory failure and BPH.  Patient was brought to the emergency room lethargic, his oxygenation was 70% and he had increased work of breathing.  Unable to get further history due to patient's altered mentation respiratory distress.  In the emergency department patient was placed on noninvasive mechanical ventilation/BiPAP.  His blood pressure was 123/63, heart rate 74, temperature 98.2, respiratory rate 20, oxygen saturation 100% on noninvasive mechanical ventilation.  His lungs had decreased breath sounds bilaterally with no rhonchi or wheezing, heart S1-S2, present rhythmic, soft abdomen, no lower extremity edema. Arterial blood gas pH 7.42, P CO2 69, PaO2 63, bicarb 44.8, sodium 144, potassium 4.7, chloride 87, bicarb 43, glucose 180, BUN 16, creatinine 0.93, troponin I 14, BNP 353, hemoglobin 8.4, hematocrit 27.8, platelets 345.  SARS COVID-19 negative.  Also specific gravity 1.010, 30 protein, more than 50 red cells, 11-20 white cells.  Chest radiograph with hyperinflation, increased lung markings bilaterally, positive hilar vascular congestion, left loculated pleural effusions.  EKG 80 bpm, left axis deviation with left anterior fascicular block, right bundle branch block, sinus rhythm with PACs/PVCs, no significant ST segment or T wave changes.    Assessment & Plan:   Principal Problem:   Acute on chronic respiratory failure with hypoxemia (HCC) Active Problems:   COPD exacerbation (HCC)   Acute on chronic diastolic CHF  (congestive heart failure) (HCC)   HTN (hypertension)   GERD (gastroesophageal reflux disease)   BPH (benign prostatic hyperplasia)   1. Acute on chronic hypoxemic and hypercapnic respiratory failure due to acute diastolic heart exacerbation and COPD exacerbation.  Patient has been successfully transitioned to nasal cannula, currently on 5 L/ min with oxygen saturation of 97%. No documentation of urine output. Systolic blood pressure 116 mmHg. Patient continue to have JVD. Serum cr at 0,75.   Continue diuresis with furosemide 40 mg Iv q 12 to target negative fluid balance, will add strict in and put. Last echocardiogram from 04/21 with preserved LV and RV systolic function.   Continue with carvedilol and enalapril. Blood pressure control with amlodipine.   2. COPD exacerbation. This am with no wheezing, decrease air movement. Will continue with aggressive bronchodilator therapy, plus inhaled corticosteroids.  Will dc systemic steroids for now.  On montelukast.  Continue close oxymetry monitoring.   3. HTN. Continue blood pressure control with amlodipine, enalapril and carvedilol.   4. BPH/ No signs of urinary retention, continue with tamsulosin.   5. Iron deficiency anemia. Continue with iron supplementation.   6. Ambulatory dysfunction. Follow up with PT and OT.   Patient continue to be at high risk for worsening heart failure/   Status is: Inpatient  Remains inpatient appropriate because:IV treatments appropriate due to intensity of illness or inability to take PO   Dispo: The patient is from: Home              Anticipated d/c is to: Home              Anticipated d/c date is: 3 days              Patient currently is  not medically stable to d/c.   DVT prophylaxis: Enoxaparin   Code Status:   dnr  Family Communication: no family at the bedside       Subjective: Patient with improved dyspnea but not yet back to baseline, no chest pain, no nausea or vomiting.    Objective: Vitals:   10/21/19 0103 10/21/19 0423 10/21/19 0837 10/21/19 1209  BP: 135/64 (!) 128/53 (!) 134/47 (!) 116/46  Pulse: 66 62 (!) 54 64  Resp: 18 17 20 15   Temp: 98.3 F (36.8 C) 98.4 F (36.9 C) 98 F (36.7 C) 98.4 F (36.9 C)  TempSrc: Oral Oral Oral Oral  SpO2: 93% 97% 90% 93%  Weight:      Height:       No intake or output data in the 24 hours ending 10/21/19 1327 Filed Weights   10/20/19 0646 10/20/19 1117  Weight: 70.3 kg 72.2 kg    Examination:   General: Not in pain or dyspnea, deconditioned  Neurology: somnolent but easy to arouse, non focal E ENT: mild pallor, no icterus, oral mucosa moist Cardiovascular: positive JVD. S1-S2 present, rhythmic, no gallops, rubs, or murmurs. No lower extremity edema. Pulmonary: decreased breath sounds bilaterally, decreased air movement, no wheezing, rhonchi or rales. Gastrointestinal. Abdomen soft and non tender Skin. No rashes Musculoskeletal: no joint deformities     Data Reviewed: I have personally reviewed following labs and imaging studies  CBC: Recent Labs  Lab 10/20/19 0629 10/20/19 2041 10/21/19 0133  WBC 13.9* 9.1  --   NEUTROABS 12.2*  --   --   HGB 8.4* 7.9* 8.2*  HCT 27.8* 25.9*  --   MCV 87.7 86.9  --   PLT 345 311  --    Basic Metabolic Panel: Recent Labs  Lab 10/14/19 1525 10/20/19 0629 10/20/19 2041 10/21/19 0508  NA  --  144  --  139  K 4.8 4.7  --  4.3  CL  --  87*  --  85*  CO2  --  43*  --  38*  GLUCOSE  --  180*  --  149*  BUN  --  16  --  18  CREATININE  --  0.93 0.88 0.75  CALCIUM  --  9.2  --  8.6*   GFR: Estimated Creatinine Clearance: 59.3 mL/min (by C-G formula based on SCr of 0.75 mg/dL). Liver Function Tests: Recent Labs  Lab 10/20/19 0629  AST 31  ALT 24  ALKPHOS 60  BILITOT 0.8  PROT 7.0  ALBUMIN 3.5   Recent Labs  Lab 10/20/19 0629  LIPASE 18   No results for input(s): AMMONIA in the last 168 hours. Coagulation Profile: Recent Labs  Lab  10/20/19 0629  INR 1.0   Cardiac Enzymes: No results for input(s): CKTOTAL, CKMB, CKMBINDEX, TROPONINI in the last 168 hours. BNP (last 3 results) No results for input(s): PROBNP in the last 8760 hours. HbA1C: No results for input(s): HGBA1C in the last 72 hours. CBG: No results for input(s): GLUCAP in the last 168 hours. Lipid Profile: No results for input(s): CHOL, HDL, LDLCALC, TRIG, CHOLHDL, LDLDIRECT in the last 72 hours. Thyroid Function Tests: No results for input(s): TSH, T4TOTAL, FREET4, T3FREE, THYROIDAB in the last 72 hours. Anemia Panel: No results for input(s): VITAMINB12, FOLATE, FERRITIN, TIBC, IRON, RETICCTPCT in the last 72 hours.    Radiology Studies: I have reviewed all of the imaging during this hospital visit personally     Scheduled Meds: . amLODipine  10  mg Oral Daily  . ascorbic acid  500 mg Oral BID  . carvedilol  6.25 mg Oral BID WC  . docusate sodium  100 mg Oral BID  . enalapril  2.5 mg Oral Daily  . enoxaparin (LOVENOX) injection  40 mg Subcutaneous Q24H  . feeding supplement (ENSURE ENLIVE)  237 mL Oral TID BM  . ferrous sulfate  325 mg Oral BID WC  . fluticasone furoate-vilanterol  1 puff Inhalation Daily  . furosemide  40 mg Intravenous Q12H  . methylPREDNISolone (SOLU-MEDROL) injection  60 mg Intravenous Q24H  . montelukast  10 mg Oral Daily  . multivitamin with minerals  1 tablet Oral Daily  . pantoprazole  40 mg Oral Daily  . sodium chloride flush  3 mL Intravenous Q12H  . tamsulosin  0.4 mg Oral QPC breakfast  . umeclidinium bromide  1 puff Inhalation Daily   Continuous Infusions: . sodium chloride       LOS: 1 day        Douglas Delisi Annett Gula, MD

## 2019-10-22 LAB — BASIC METABOLIC PANEL
Anion gap: 6 (ref 5–15)
BUN: 23 mg/dL (ref 8–23)
CO2: 42 mmol/L — ABNORMAL HIGH (ref 22–32)
Calcium: 8.6 mg/dL — ABNORMAL LOW (ref 8.9–10.3)
Chloride: 82 mmol/L — ABNORMAL LOW (ref 98–111)
Creatinine, Ser: 0.65 mg/dL (ref 0.61–1.24)
GFR calc Af Amer: >60 mL/min (ref >60)
GFR calc non Af Amer: >60 mL/min (ref >60)
Glucose, Bld: 178 mg/dL — ABNORMAL HIGH (ref 70–99)
Potassium: 4.4 mmol/L (ref 3.5–5.1)
Sodium: 130 mmol/L — ABNORMAL LOW (ref 135–145)

## 2019-10-22 MED ORDER — IPRATROPIUM-ALBUTEROL 0.5-2.5 (3) MG/3ML IN SOLN
3.0000 mL | Freq: Three times a day (TID) | RESPIRATORY_TRACT | Status: DC
  Administered 2019-10-22 – 2019-10-24 (×6): 3 mL via RESPIRATORY_TRACT
  Filled 2019-10-22 (×6): qty 3

## 2019-10-22 NOTE — TOC Progression Note (Signed)
Transition of Care [facility]) - Progression Note    Patient Details  Name: Douglas Mathis MRN: [id number] Date of Birth: 1935-10-20  Transition of Care [facility]) CM/SW Contact  Ashley Royalty Lutricia Feil, RN Phone Number: 10/22/2019, 12:43 PM  Clinical Narrative:   Spoke with daughter Douglas Mathis with update on discharge disposition. Per MD pt continue with IV treatments due to intensity of illness or inability to take PO. No bed offers for placement yet however will continue to monitor. No other needs presented at this time.  TOC will continue to follow.     Expected Discharge Plan: Home w Home Health Services Barriers to Discharge: Continued Medical Work up  Expected Discharge Plan and Services Expected Discharge Plan: Home w Home Health Services       Living arrangements for the past 2 months: Single Family Home                                       Social Determinants of Health (SDOH) Interventions    Readmission Risk Interventions No flowsheet data found.

## 2019-10-22 NOTE — Progress Notes (Signed)
PROGRESS NOTE    Douglas STENSETH  DVV:[id number] DOB: 1935/08/31 DOA: 10/20/2019 PCP: [facility]    Brief Narrative:  Patient admitted to the hospital with a working diagnosis of acute on chronic hypoxic/hypercapnic diastolic heart failure exacerbation/ COPD exacerbation  84 year old male with a past medical history of chronic diastolic heart failure, hypertension, chronic hypoxic respiratory failure due to COPD, chronic hypercapnic respiratory failure and BPH.  Patient was brought to the emergency room lethargic, his oxygenation was 70% and he had increased work of breathing.  Unable to get further history due to patient's altered mentation respiratory distress.  In the emergency department patient was placed on noninvasive mechanical ventilation/BiPAP.  His blood pressure was 123/63, heart rate 74, temperature 98.2, respiratory rate 20, oxygen saturation 100% on noninvasive mechanical ventilation.  His lungs had decreased breath sounds bilaterally with no rhonchi or wheezing, heart S1-S2, present rhythmic, soft abdomen, no lower extremity edema. Arterial blood gas pH 7.42, P CO2 69, PaO2 63, bicarb 44.8, sodium 144, potassium 4.7, chloride 87, bicarb 43, glucose 180, BUN 16, creatinine 0.93, troponin I 14, BNP 353, hemoglobin 8.4, hematocrit 27.8, platelets 345.  SARS COVID-19 negative.  Also specific gravity 1.010, 30 protein, more than 50 red cells, 11-20 white cells.  Chest radiograph with hyperinflation, increased lung markings bilaterally, positive hilar vascular congestion, left loculated pleural effusions.  EKG 80 bpm, left axis deviation with left anterior fascicular block, right bundle branch block, sinus rhythm with PACs/PVCs, no significant ST segment or T wave changes  CT chest from 08/20 with no PE, chronic loculated bilateral pleural effusions and positive emphysema.   Assessment & Plan:   Principal Problem:   Acute on chronic respiratory failure with  hypoxemia (HCC) Active Problems:   COPD exacerbation (HCC)   Acute on chronic diastolic CHF (congestive heart failure) (HCC)   HTN (hypertension)   GERD (gastroesophageal reflux disease)   BPH (benign prostatic hyperplasia)   1. Acute on chronic hypoxemic and hypercapnic respiratory failure due to acute diastolic heart exacerbation and COPD exacerbation. EF LV 60 to 65% Patient is more awake and alert today, his oxygenation is 91% on 4 L.min per [place] and reports improvement in dyspnea. Urine output over last 24 H is 950 ml.. Systolic blood pressure 115 mmHg. Serum K is 4.4 and creatinine 0,65.   Tolerating well diuresis with furosemide 40 mg Iv q 12 to target negative fluid balance, continue with strict in and put.  On carvedilol and enalapril for heart failure management and amlodipine for blood pressure control.   2. COPD exacerbation. Patient has chronic hypoxemic respiratory failure 2 to 3 L of supplemental 02 per  at home.  Will continue with aggressive bronchodilator therapy, plus inhaled corticosteroids.  Continue with montelukast.   Suspected sleep apnea, will add cpap at night.   3. HTN. Continue with amlodipine, enalapril and carvedilol for blood pressure control.    4. BPH/ Continue with tamsulosin.   5. Iron deficiency anemia. On iron supplementation.   6. Ambulatory dysfunction. Patient continue to be very weak and deconditioned, OT has recommended SNF.      Status is: Inpatient  Remains inpatient appropriate because:IV treatments appropriate due to intensity of illness or inability to take PO   Dispo: The patient is from: Home              Anticipated d/c is to: SNF              Anticipated d/c date is:  2 days              Patient currently is not medically stable to d/c.    DVT prophylaxis: Enoxaparin   Code Status:   dnr   Family Communication:  Not able to reach her daughter over the phone I left a voice message.    Subjective: Patient is more  awake and alert today, hi dyspnea continue to improve but not yet back to baseline, continue to be symptomatic with minimal efforts. Limited information due to cognitive impairment.   Objective: Vitals:   10/21/19 2343 10/22/19 0500 10/22/19 0824 10/22/19 0842  BP: (!) 134/54  (!) 115/45   Pulse: 71  66   Resp: 20  16   Temp: 98.6 F (37 C)  (!) 97.5 F (36.4 C)   TempSrc: Oral  Oral   SpO2: 92%  (!) 75% 91%  Weight:  78.2 kg    Height:        Intake/Output Summary (Last 24 hours) at 10/22/2019 0922 Last data filed at 10/22/2019 0107 Gross per 24 hour  Intake --  Output 950 ml  Net -950 ml   Filed Weights   10/20/19 0646 10/20/19 1117 10/22/19 0500  Weight: 70.3 kg 72.2 kg 78.2 kg    Examination:   General: Not in pain, positive dyspnea at rest, mild to moderate Neurology: Awake and alert, non focal. Positive confusion but not agitation  E ENT: mild pallor, no icterus, oral mucosa moist Cardiovascular: positive external JVD. S1-S2 present, rhythmic, no gallops, rubs, or murmurs. No lower extremity edema. Pulmonary: decreased breath sounds bilaterally, with no significant wheezing, or rhonchi, scattered rales bilaterally. Anterior auscultation.  Gastrointestinal. Abdomen soft and non tender Skin. No rashes Musculoskeletal: no joint deformities     Data Reviewed: I have personally reviewed following labs and imaging studies  CBC: Recent Labs  Lab 10/20/19 0629 10/20/19 2041 10/21/19 0133  WBC 13.9* 9.1  --   NEUTROABS 12.2*  --   --   HGB 8.4* 7.9* 8.2*  HCT 27.8* 25.9*  --   MCV 87.7 86.9  --   PLT 345 311  --    Basic Metabolic Panel: Recent Labs  Lab 10/20/19 0629 10/20/19 2041 10/21/19 0508 10/22/19 0534  NA 144  --  139 130*  K 4.7  --  4.3 4.4  CL 87*  --  85* 82*  CO2 43*  --  38* 42*  GLUCOSE 180*  --  149* 178*  BUN 16  --  18 23  CREATININE 0.93 0.88 0.75 0.65  CALCIUM 9.2  --  8.6* 8.6*   GFR: Estimated Creatinine Clearance: 61.7  mL/min (by C-G formula based on SCr of 0.65 mg/dL). Liver Function Tests: Recent Labs  Lab 10/20/19 0629  AST 31  ALT 24  ALKPHOS 60  BILITOT 0.8  PROT 7.0  ALBUMIN 3.5   Recent Labs  Lab 10/20/19 0629  LIPASE 18   No results for input(s): AMMONIA in the last 168 hours. Coagulation Profile: Recent Labs  Lab 10/20/19 0629  INR 1.0   Cardiac Enzymes: No results for input(s): CKTOTAL, CKMB, CKMBINDEX, TROPONINI in the last 168 hours. BNP (last 3 results) No results for input(s): PROBNP in the last 8760 hours. HbA1C: No results for input(s): HGBA1C in the last 72 hours. CBG: No results for input(s): GLUCAP in the last 168 hours. Lipid Profile: No results for input(s): CHOL, HDL, LDLCALC, TRIG, CHOLHDL, LDLDIRECT in the last 72 hours. Thyroid Function Tests:  No results for input(s): TSH, T4TOTAL, FREET4, T3FREE, THYROIDAB in the last 72 hours. Anemia Panel: No results for input(s): VITAMINB12, FOLATE, FERRITIN, TIBC, IRON, RETICCTPCT in the last 72 hours.    Radiology Studies: I have reviewed all of the imaging during this hospital visit personally     Scheduled Meds: . amLODipine  10 mg Oral Daily  . ascorbic acid  500 mg Oral BID  . carvedilol  6.25 mg Oral BID WC  . docusate sodium  100 mg Oral BID  . enalapril  2.5 mg Oral Daily  . enoxaparin (LOVENOX) injection  40 mg Subcutaneous Q24H  . feeding supplement (ENSURE ENLIVE)  237 mL Oral TID BM  . ferrous sulfate  325 mg Oral BID WC  . fluticasone furoate-vilanterol  1 puff Inhalation Daily  . furosemide  40 mg Intravenous Q12H  . ipratropium-albuterol  3 mL Nebulization TID  . montelukast  10 mg Oral Daily  . multivitamin with minerals  1 tablet Oral Daily  . pantoprazole  40 mg Oral Daily  . tamsulosin  0.4 mg Oral QPC breakfast   Continuous Infusions:   LOS: 2 days        Sanchez Hemmer Annett Gula, MD

## 2019-10-22 NOTE — Progress Notes (Signed)
Chaplain responded to a Rapid Response page. Code had been canceled upon arrival. Chaplain introduced himself to care team members and Pt, Douglas Mathis.   Chaplain offered emotional-spiritual support to Douglas Mathis by asking an open ended question. Douglas Mathis declined stating he would like to eat his breakfast when delivered and then rest.  Chaplain communicated with Douglas Mathis compassionate RN about his meal.  Chaplain thanked Charity fundraiser and Douglas Mathis.  Lillyth Spong B. Neva Seat, MDiv Staff Chaplain-Relief

## 2019-10-23 ENCOUNTER — Encounter: Payer: Self-pay | Admitting: Internal Medicine

## 2019-10-23 DIAGNOSIS — Z515 Encounter for palliative care: Secondary | ICD-10-CM

## 2019-10-23 DIAGNOSIS — Z7189 Other specified counseling: Secondary | ICD-10-CM

## 2019-10-23 LAB — BASIC METABOLIC PANEL
Anion gap: 10 (ref 5–15)
BUN: 29 mg/dL — ABNORMAL HIGH (ref 8–23)
CO2: 40 mmol/L — ABNORMAL HIGH (ref 22–32)
Calcium: 8.9 mg/dL (ref 8.9–10.3)
Chloride: 82 mmol/L — ABNORMAL LOW (ref 98–111)
Creatinine, Ser: 0.82 mg/dL (ref 0.61–1.24)
GFR calc Af Amer: >60 mL/min (ref >60)
GFR calc non Af Amer: >60 mL/min (ref >60)
Glucose, Bld: 118 mg/dL — ABNORMAL HIGH (ref 70–99)
Potassium: 4.6 mmol/L (ref 3.5–5.1)
Sodium: 132 mmol/L — ABNORMAL LOW (ref 135–145)

## 2019-10-23 LAB — URINE CULTURE: Culture: >=100000 — AB

## 2019-10-23 MED ORDER — FUROSEMIDE 40 MG PO TABS
60.0000 mg | ORAL_TABLET | Freq: Two times a day (BID) | ORAL | Status: DC
  Administered 2019-10-23: 60 mg via ORAL
  Filled 2019-10-23 (×2): qty 1

## 2019-10-23 NOTE — Consult Note (Signed)
Consultation Note Date: 10/23/2019   Patient Name: Douglas Mathis  DOB: 12/05/1935  MRN: [id number]  Age / Sex: 84 y.o., male  PCP: [facility] [place] Referring Physician: Cathlean Sauer, Jimmy Picket  Reason for Consultation: Establishing goals of care and Psychosocial/spiritual support  HPI/Patient Profile: 84 y.o. male  with past medical history of chronic diastolic heart failure, hypertension, chronic respiratory failure with COPD on chronic 3 L of oxygen, chronic hypercapnia, BPH, anemia, admitted on 10/20/2019 with acute on chronic hypoxemic and hypercapnic respiratory failure due to acute diastolic heart failure exacerbation and COPD exacerbation.   Clinical Assessment and Goals of Care: I have reviewed medical records including EPIC notes, labs and imaging, examined the patient and met at bedside with daughter, Douglas Mathis to discuss diagnosis prognosis, [place], EOL wishes, disposition and options.  I introduced Palliative Medicine as specialized medical care for people living with serious illness. It focuses on providing relief from the symptoms and stress of a serious illness.   Douglas Mathis is lying quietly in bed.  He will make and briefly keep eye contact.  He appears chronically ill and somewhat frail.  He is alert and oriented to self and situation, Douglas Mathis, tells me that she has noticed a cognitive decline over the last few months.   We discussed a brief life review of the patient.  Douglas Mathis is a widower, he has 3 adult daughters, and Douglas Mathis is his main support, as his 2 other daughters work full-time  As far as functional and nutritional status, Douglas Mathis has experienced declines over the last few months.  He has had 4 hospital visits in the last 6 months.  We discussed current illness and what it means in the larger context of on-going co-morbidities.  Natural disease trajectory  and expectations at EOL were discussed.  We talked about the heart failure chronic illness pathway.  Douglas Mathis seems to be somewhat experienced.  Advanced directives, concepts specific to code status, were considered and discussed.  Hospice and Palliative Care services outpatient were explained and offered.  At this point, Douglas Mathis and Douglas Mathis are open to outpatient palliative services to continue discussing "what if's and maybe's.  Questions and concerns were addressed.  The family was encouraged to call with questions or concerns.   Conference with attending, bedside nursing staff, transition care team related condition, needs, goals of care. PMT to follow as needed.  HCPOA   NEXT OF KIN -Douglas Mathis is a widower.  His daughter, Douglas Mathis, is his main healthcare surrogate.  He has 2 additional daughters, but they work full-time.   SUMMARY OF RECOMMENDATIONS   Continue to treat the treatable but no extraordinary measures such as CPR or intubation. Agreeable to outpatient palliative services to talk about the "what if's and maybe's" Agreeable to long-term care placement.   Code Status/Advance Care Planning:  DNR -confirmed with daughter today  Symptom Management:   Per hospitalist, no additional needs at this time.  Palliative Prophylaxis:   Oral Care and Turn  Reposition  Additional Recommendations (Limitations, Scope, Preferences):  Treat the treatable but no CPR or intubation  Psycho-social/Spiritual:   Desire for further Chaplaincy support:no  Additional Recommendations: Caregiving  Support/Resources and Education on Hospice  Prognosis:   Unable to determine, based on outcomes.  6 months or less would not be surprising based on declining functional status, chronic illness burden, 4 hospital visits in the last 6 months, transitioning to long-term care.  Discharge Planning: To be determined, seeking long-term care placement.  Agreeable to outpatient palliative services to  follow      Primary Diagnoses: Present on Admission: . Acute on chronic respiratory failure with hypoxemia ([place]) . HTN (hypertension) . COPD exacerbation ([place]) . GERD (gastroesophageal reflux disease) . BPH (benign prostatic hyperplasia)   I have reviewed the medical record, interviewed the patient and family, and examined the patient. The following aspects are pertinent.  Past Medical History:  Diagnosis Date  . Anemia   . BPH (benign prostatic hyperplasia)   . CHF (congestive heart failure) ([place])   . COPD (chronic obstructive pulmonary disease) ([place])   . Hypercholesterolemia   . Hypertension    Social History   Socioeconomic History  . Marital status: Widowed    Spouse name: Not on file  . Number of children: Not on file  . Years of education: Not on file  . Highest education level: Not on file  Occupational History  . Not on file  Tobacco Use  . Smoking status: Current Every Day Smoker    Packs/day: 0.50  . Smokeless tobacco: Never Used  Substance and Sexual Activity  . Alcohol use: Not Currently    Comment: used to drink a bottle on the weekend with friends  . Drug use: Never  . Sexual activity: Not on file  Other Topics Concern  . Not on file  Social History Narrative  . Not on file   Social Determinants of Health   Financial Resource Strain:   . Difficulty of Paying Living Expenses: Not on file  Food Insecurity:   . Worried About Charity fundraiser in the Last Year: Not on file  . Ran Out of Food in the Last Year: Not on file  Transportation Needs:   . Lack of Transportation (Medical): Not on file  . Lack of Transportation (Non-Medical): Not on file  Physical Activity:   . Days of Exercise per Week: Not on file  . Minutes of Exercise per Session: Not on file  Stress:   . Feeling of Stress : Not on file  Social Connections:   . Frequency of Communication with Friends and Family: Not on file  . Frequency of Social Gatherings with Friends and  Family: Not on file  . Attends Religious Services: Not on file  . Active Member of Clubs or Organizations: Not on file  . Attends Archivist Meetings: Not on file  . Marital Status: Not on file   Family History  Problem Relation Age of Onset  . Diabetes Mother   . CAD Mother   . Bone cancer Brother   . Bronchitis Father    Scheduled Meds: . amLODipine  10 mg Oral Daily  . ascorbic acid  500 mg Oral BID  . carvedilol  6.25 mg Oral BID WC  . docusate sodium  100 mg Oral BID  . enalapril  2.5 mg Oral Daily  . feeding supplement (ENSURE ENLIVE)  237 mL Oral TID BM  . ferrous sulfate  325 mg Oral BID  WC  . fluticasone furoate-vilanterol  1 puff Inhalation Daily  . furosemide  60 mg Oral BID  . ipratropium-albuterol  3 mL Nebulization TID  . montelukast  10 mg Oral Daily  . multivitamin with minerals  1 tablet Oral Daily  . pantoprazole  40 mg Oral Daily  . tamsulosin  0.4 mg Oral QPC breakfast   Continuous Infusions: PRN Meds:.acetaminophen, albuterol, ondansetron (ZOFRAN) IV Medications Prior to Admission:  Prior to Admission medications   Medication Sig Start Date End Date Taking? Authorizing Provider  amLODipine (NORVASC) 10 MG tablet Take 10 mg by mouth daily. 10/02/19  Yes [provider]  Ascorbic Acid (VITAMIN C) 500 MG CAPS Take 500 mg by mouth 2 (two) times daily.  08/21/19  Yes [provider]  carvedilol (COREG) 6.25 MG tablet Take 6.25 mg by mouth 2 (two) times daily with a meal.   Yes [provider]  DOK 100 MG capsule Take 100 mg by mouth 2 (two) times daily. 06/23/19  Yes [provider]  feeding supplement, ENSURE ENLIVE, (ENSURE ENLIVE) LIQD Take 237 mLs by mouth 3 (three) times daily between meals. 06/29/19  Yes Fritzi Mandes, MD  ferrous sulfate 325 (65 FE) MG tablet Take 1 tablet (325 mg total) by mouth 2 (two) times daily with a meal. 07/22/15  Yes Loletha Grayer, MD  Fluticasone-Umeclidin-Vilant 100-62.5-25 MCG/INH AEPB  Inhale 1 puff into the lungs daily. 12/13/17  Yes [provider]  furosemide (LASIX) 40 MG tablet Take 40 mg by mouth 2 (two) times daily. 10/02/19  Yes [provider]  ipratropium-albuterol (DUONEB) 0.5-2.5 (3) MG/3ML SOLN Take 3 mLs by nebulization every 6 (six) hours as needed (shortness of breath or wheezing).  06/23/19  Yes [provider]  montelukast (SINGULAIR) 10 MG tablet Take 10 mg by mouth daily. 10/22/17  Yes [provider]  Multiple Vitamin (MULTIVITAMIN WITH MINERALS) TABS tablet Take 1 tablet by mouth daily. 06/29/19  Yes Fritzi Mandes, MD  pantoprazole (PROTONIX) 40 MG tablet Take 40 mg by mouth daily.  12/08/17  Yes [provider]  PROAIR HFA 108 (90 Base) MCG/ACT inhaler Inhale 2 puffs into the lungs 4 (four) times daily as needed for wheezing or shortness of breath.  08/04/17  Yes [provider]  tamsulosin (FLOMAX) 0.4 MG CAPS capsule Take 0.4 mg by mouth daily after breakfast.    Yes [provider]   No Known Allergies Review of Systems  Unable to perform ROS: Age    Physical Exam Vitals and nursing note reviewed.  Constitutional:      General: He is not in acute distress.    Appearance: He is ill-appearing.  HENT:     Mouth/Throat:     Mouth: Mucous membranes are moist.  Cardiovascular:     Rate and Rhythm: Normal rate.  Pulmonary:     Effort: Pulmonary effort is normal. No respiratory distress.  Abdominal:     Palpations: Abdomen is soft.  Skin:    General: Skin is warm and dry.  Neurological:     Mental Status: He is alert.     Comments: Oriented to person, place and situation, but comes and goes with cognitive ability  Psychiatric:        Mood and Affect: Mood normal.        Behavior: Behavior normal.     Comments: Calm and cooperative, not fearful     Vital Signs: BP (!) 132/49 (BP Location: Left Arm)   Pulse 82  Temp 98 F (36.7 C) (Oral)   Resp 18   Ht 5' (1.524 m)   Wt 82.1 kg    SpO2 92%   BMI 35.33 kg/m  Pain Scale: 0-10   Pain Score: 5    SpO2: SpO2: 92 % O2 Device:SpO2: 92 % O2 Flow Rate: .O2 Flow Rate (L/min): 4 L/min  IO: Intake/output summary:   Intake/Output Summary (Last 24 hours) at 10/23/2019 1240 Last data filed at 10/23/2019 1219 Gross per 24 hour  Intake --  Output 3100 ml  Net -3100 ml    LBM: Last BM Date: 10/21/19 Baseline Weight: Weight: 70.3 kg Most recent weight: Weight: 82.1 kg     Palliative Assessment/Data:   Flowsheet Rows     Most Recent Value  Intake Tab  Referral Department Hospitalist  Unit at Time of Referral Med/Surg Unit  Palliative Care Primary Diagnosis Cardiac  Date Notified 10/20/19  Palliative Care Type New Palliative care  Reason for referral Clarify Goals of Care  Date of Admission 10/20/19  Date first seen by Palliative Care 10/23/19  # of days Palliative referral response time 3 Day(s)  # of days IP prior to Palliative referral 0  Clinical Assessment  Palliative Performance Scale Score 30%  Pain Max last 24 hours Not able to report  Pain Min Last 24 hours Not able to report  Dyspnea Max Last 24 Hours Not able to report  Dyspnea Min Last 24 hours Not able to report  Psychosocial & Spiritual Assessment  Palliative Care Outcomes      Time In: 0840 Time Out: 0930 Time Total: 50 minutes Greater than 50%  of this time was spent counseling and coordinating care related to the above assessment and plan.  Signed by: Drue Novel, NP   Please contact Palliative Medicine Team phone at [phone] for questions and concerns.  For individual provider: See Shea Evans

## 2019-10-23 NOTE — TOC Progression Note (Signed)
Transition of Care [facility]) - Progression Note    Patient Details  Name: Douglas Mathis MRN: [id number] Date of Birth: 03-06-1935  [facility]) CM/SW Contact  Barrie Dunker, RN Phone Number: 10/23/2019, 4:07 PM  Clinical Narrative:   Sherron Monday with [facility] and they stated that they do not have a bed offer any longer, I called Corrie Dandy and spoke with her to let her know that [facility]  is the only bed offer at this time,  She accepted the bed offer, I notified [facility] of the acceptance    Expected Discharge Plan: Home w Home Health Services Barriers to Discharge: Continued Medical Work up  Expected Discharge Plan and Services Expected Discharge Plan: Home w Home Health Services       Living arrangements for the past 2 months: Single Family Home                                       Social Determinants of Health (SDOH) Interventions    Readmission Risk Interventions No flowsheet data found.

## 2019-10-23 NOTE — TOC Progression Note (Signed)
Transition of Care [facility]) - Progression Note    Patient Details  Name: Douglas Mathis MRN: [id number] Date of Birth: [birth year]  [facility] of [facility]) CM/SW Contact  Barrie Dunker, RN Phone Number: 10/23/2019, 9:07 AM  Clinical Narrative:   Extended the bed search to [facility]    Expected Discharge Plan: Home w Home Health Services Barriers to Discharge: Continued Medical Work up  Expected Discharge Plan and Services Expected Discharge Plan: Home w Home Health Services       Living arrangements for the past 2 months: Single Family Home                                       Social Determinants of Health (SDOH) Interventions    Readmission Risk Interventions No flowsheet data found.

## 2019-10-23 NOTE — Plan of Care (Signed)
Pt tolerated CPAP well this shift. Problem: Education: Goal: Knowledge of General Education information will improve Description: Including pain rating scale, medication(s)/side effects and non-pharmacologic comfort measures Outcome: Progressing   Problem: Health Behavior/Discharge Planning: Goal: Ability to manage health-related needs will improve Outcome: Progressing   Problem: Clinical Measurements: Goal: Ability to maintain clinical measurements within normal limits will improve Outcome: Progressing Goal: Will remain free from infection Outcome: Progressing Goal: Diagnostic test results will improve Outcome: Progressing Goal: Respiratory complications will improve Outcome: Progressing Goal: Cardiovascular complication will be avoided Outcome: Progressing   Problem: Activity: Goal: Risk for activity intolerance will decrease Outcome: Progressing   Problem: Nutrition: Goal: Adequate nutrition will be maintained Outcome: Progressing   Problem: Coping: Goal: Level of anxiety will decrease Outcome: Progressing   Problem: Elimination: Goal: Will not experience complications related to bowel motility Outcome: Progressing Goal: Will not experience complications related to urinary retention Outcome: Progressing   Problem: Pain Managment: Goal: General experience of comfort will improve Outcome: Progressing   Problem: Safety: Goal: Ability to remain free from injury will improve Outcome: Progressing   Problem: Skin Integrity: Goal: Risk for impaired skin integrity will decrease Outcome: Progressing

## 2019-10-23 NOTE — TOC Progression Note (Signed)
Transition of Care [facility]) - Progression Note    Patient Details  Name: DELANDO SATTER MRN: [id number] Date of Birth: 05-Jun-1935  Transition of Care [facility]) CM/SW Contact  Barrie Dunker, RN Phone Number: 10/23/2019, 3:44 PM  Clinical Narrative:   Spoke with the patient's daughter Corrie Dandy, She chose the bed at [facility], I called [facility] and left a VM accepteing the bed offer    Expected Discharge Plan: Home w Home Health Services Barriers to Discharge: Continued Medical Work up  Expected Discharge Plan and Services Expected Discharge Plan: Home w Home Health Services       Living arrangements for the past 2 months: Single Family Home                                       Social Determinants of Health (SDOH) Interventions    Readmission Risk Interventions No flowsheet data found.

## 2019-10-23 NOTE — Progress Notes (Signed)
Occupational Therapy Treatment Patient Details Name: Douglas Mathis MRN: [id number] DOB: 1935/05/29 Today's Date: 10/23/2019    History of present illness Per MD notes: Pt is an 84 y.o. male with a known history of chronic diastolic congestive heart failure, hypertension, chronic respiratory failure with COPD on chronically 3 L of oxygen, chronic hypercapnia, and BPH comes to the emergency room lethargic, poor oxygen saturation at home in the 70s according to the daughter and shortness of breath.  MD assessment includes:  Acute metabolic encephalopathy suspected due to hypoxia and chronic hypercapnia in the setting of acute on chronic diastolic heart failure and COPD exacerbation, chronic respiratory failure with COPD chronic hypercapnia, history of recurrent falls, and chronic anemia.   OT comments  Pt seen for OT tx this date to f/u re: safety with ADLs/ADL mobility including transfers with RW. Pt received in bed with bed alarm set, he is pleasant and motivated for therapy. OT facilitates sup to sit transition with MIN A with HOB elevated. Pt tolerates sitting x~18 mins to complete self care ADLs with setup for seated grooming and MOD A for LB dressing. OT also engages pt in sit to stand trial to practice hand placement and pt requires MIN A from elevated EOB and MOD verbal cues for safe hand placement. Pt tolerates well overall, increased time required for transitions/transfers d/t low tolerance. Continue to recommend SNF as most appropriate d/c option given pt's current fxl tolerance.    Follow Up Recommendations  SNF    Equipment Recommendations       Recommendations for Other Services      Precautions / Restrictions Precautions Precautions: Fall Restrictions Weight Bearing Restrictions: No       Mobility Bed Mobility Overal bed mobility: Needs Assistance Bed Mobility: Supine to Sit;Sit to Supine     Supine to sit: Min assist Sit to supine: Min assist   General bed mobility  comments: after getting back to bed, MOD A for propulsion towards HOB with cues to reach with UEs/push through LEs.  Transfers Overall transfer level: Needs assistance Equipment used: Rolling walker (2 wheeled)   Sit to Stand: Min assist;From elevated surface         General transfer comment: with RW with MOD verbal/tactile cues for safe hand placement.    Balance Overall balance assessment: Needs assistance Sitting-balance support: Feet supported Sitting balance-Leahy Scale: Good     Standing balance support: Bilateral upper extremity supported Standing balance-Leahy Scale: Fair Standing balance comment: MIN A with UE support through RW                           ADL either performed or assessed with clinical judgement   ADL Overall ADL's : Needs assistance/impaired     Grooming: Wash/dry hands;Wash/dry face;Oral care;Set up;Sitting Grooming Details (indicate cue type and reason): EOB sitting with G static sitting balance             Lower Body Dressing: Moderate assistance;Sitting/lateral leans Lower Body Dressing Details (indicate cue type and reason): to don socks, seated EOB with cues for safety                     Vision Patient Visual Report: No change from baseline     Perception     Praxis      Cognition Arousal/Alertness: Awake/alert Behavior During Therapy: WFL for tasks assessed/performed Overall Cognitive Status: No family/caregiver present to determine baseline cognitive functioning  General Comments: disoriented to time/situation. Follows 1 step commands. Pleasant, appropriate conversationally, speech somewhat garbled.        Exercises Other Exercises Other Exercises: OT facilitates pt participation in seated ADLs including oral care while seated EOB, engaged pt in sitting tasks x~17-18 mins with setup for UB, MOD A for lower. Pt tolerarates well. OT engages in one stand trial to  reinforce safety with use of RW for ADL transfers and to swap out chucks under pt to optimize clean bedding. Pt tolerates well.   Shoulder Instructions       General Comments      Pertinent Vitals/ Pain       Pain Assessment: 0-10 Faces Pain Scale: Hurts a little bit Pain Location: Pt c/o L LE pain at end of session, requests tylenol Pain Descriptors / Indicators: Sore Pain Intervention(s): Limited activity within patient's tolerance;Monitored during session;Patient requesting pain meds-RN notified  Home Living                                          Prior Functioning/Environment              Frequency  Min 1X/week        Progress Toward Goals  OT Goals(current goals can now be found in the care plan section)  Progress towards OT goals: Progressing toward goals  Acute Rehab OT Goals Patient Stated Goal: To get stronger OT Goal Formulation: With patient Time For Goal Achievement: 11/04/19 Potential to Achieve Goals: Good  Plan Discharge plan remains appropriate    Co-evaluation                 AM-PAC OT "6 Clicks" Daily Activity     Outcome Measure   Help from another person eating meals?: A Little Help from another person taking care of personal grooming?: A Little Help from another person toileting, which includes using toliet, bedpan, or urinal?: A Lot Help from another person bathing (including washing, rinsing, drying)?: A Lot Help from another person to put on and taking off regular upper body clothing?: A Little Help from another person to put on and taking off regular lower body clothing?: A Lot 6 Click Score: 15    End of Session Equipment Utilized During Treatment: Rolling walker;Oxygen  OT Visit Diagnosis: Other abnormalities of gait and mobility (R26.89);History of falling (Z91.81)   Activity Tolerance Patient tolerated treatment well   Patient Left in bed;with call bell/phone within reach;with bed alarm set    Nurse Communication          Time: 4097-3532 OT Time Calculation (min): 42 min  Charges: OT General Charges $OT Visit: 1 Visit OT Treatments $Self Care/Home Management : 23-37 mins $Therapeutic Activity: 8-22 mins  Rejeana Brock, MS, OTR/L ascom [phone] 10/23/19, 4:41 PM

## 2019-10-23 NOTE — TOC Transition Note (Signed)
Transition of Care [facility]) - CM/SW Discharge Note   Patient Details  Name: Douglas Mathis MRN: [id number] Date of Birth: 05-15-1935  Transition of Care [facility]) CM/SW Contact:  Barrie Dunker, RN Phone Number: 10/23/2019, 10:50 AM   Clinical Narrative:   Martha Clan at [facility] to ask if they can offer a long term bed, The patient has been vaccinated      Barriers to Discharge: Continued Medical Work up   Patient Goals and CMS Choice Patient states their goals for this hospitalization and ongoing recovery are:: Feel better      Discharge Placement                       Discharge Plan and Services                                     Social Determinants of Health (SDOH) Interventions     Readmission Risk Interventions No flowsheet data found.

## 2019-10-23 NOTE — Care Management Important Message (Signed)
Important Message  Patient Details  Name: Douglas Mathis MRN: [id number] Date of Birth: [birth year]   Medicare Important Message Given:  Yes  Reviewed with daughter, [name].     [name] 10/23/2019, 2:19 PM

## 2019-10-23 NOTE — Progress Notes (Signed)
PROGRESS NOTE    Douglas Mathis  WER:[id number] DOB: 10/06/1935 DOA: 10/20/2019 [facility]CP: [facility]    Brief Narrative:  Patient admitted to the hospital with a working diagnosis of acute on chronic hypoxic/hypercapnic diastolic heart failure exacerbation/ COPD exacerbation  84 year old male with a past medical history of chronic diastolic heart failure, hypertension, chronic hypoxic respiratory failure due to COPD, chronic hypercapnic respiratory failureandBPH.Patient was brought to the emergency room lethargic, his oxygenation was 70% and he had increased work of breathing. Unable to get further history due to patient's altered mentation respiratory distress. In the emergency department patient was placed on noninvasive mechanical ventilation/BiPAP.His blood pressure was 123/63, heart rate 74, temperature 98.2, respiratory rate 20, oxygen saturation 100% on noninvasive mechanical ventilation. His lungs had decreased breath sounds bilaterally with no rhonchi or wheezing, heart S1-S2, present rhythmic, soft abdomen, no lower extremity edema. Arterial blood gas pH 7.42, P CO2 69, PaO2 63,bicarb 44.8,sodium 144, potassium 4.7, chloride 87, bicarb 43, glucose 180,BUN 16, creatinine 0.93, troponin I 14, BNP 353, hemoglobin 8.4, hematocrit 27.8, platelets 345. SARS COVID-19 negative. Also specific gravity 1.010, 30 protein, more than 50 red cells, 11-20 white cells. Chest radiograph with hyperinflation, increased lung markings bilaterally, positive hilar vascular congestion, left loculated pleural effusions. EKG 80 bpm,left axis deviation with left anterior fascicular block, right bundle branch block, sinus rhythm with PACs/PVCs, no significant ST segment or T wave changes  CT chest from 08/20 with no PE, chronic loculated bilateral pleural effusions and positive emphysema.   Patient has responded well to diuresis, but continue to be very weak and  deconditioned, physical/ occupational therapy have recommended SNF placement.    Assessment & Plan:   Principal Problem:   Acute on chronic respiratory failure with hypoxemia (HCC) Active Problems:   COPD exacerbation (HCC)   Acute on chronic diastolic CHF (congestive heart failure) (HCC)   HTN (hypertension)   GERD (gastroesophageal reflux disease)   BPH (benign prostatic hyperplasia)   1. Acute on chronic hypoxemic and hypercapnic respiratory failure due to acute diastolic heart exacerbation and COPD exacerbation. EF LV 60 to 65% Oxygenation is 92% on 4 L.min per [place] . Urine output over last 24 H is 2,400 ml.. Systolic blood pressure 132 mmHg. Serum K is 4.6 and creatinine 0,82.   Improved volume status, will transition to oral furosemide, 60 mg po bid, to keep negative fluid balance.   Continue with carvedilol, amlodipine and enalapril.  2. COPD exacerbation. Chronic hypoxemic respiratory failure 2 to 3 L of supplemental 02 per [place] at home. This am is awake and alert, his daughter is at the bedside and reported to be back to baseline in his mentation and degree of dyspnea.  Tolerating well aggressive bronchodilator therapy, plus inhaled corticosteroids.  On montelukast.   Patient did not tolerate Cpap last night.   3. HTN. On amlodipine, enalapril and carvedilol with good toleration.   4. BPH/ On tamsulosin.   5. Iron deficiency anemia. Continue with iron supplementation.   6. Ambulatory dysfunction. his daughter is at the bedside and agrees with SNF placement. Patient considered unsafe discharge home at this point.   7. Hyponatremia and hyperkalemia. Na is down to 132 and K 4,6, renal function stable with serum cr at 0,82 with bicarbonate at 29. Change furosemide to 60 mg po bid. Volume status is improving.   Will continue close follow up on renal function and electrolytes.    Status is: Inpatient  Remains inpatient appropriate because:Unsafe  d/c  plan   Dispo: The patient is from: Home              Anticipated d/c is to: SNF              Anticipated d/c date is: 1 day              Patient currently is medically stable to d/c.   DVT prophylaxis: Enoxaparin   Code Status:   full  Family Communication:  I spoke with patient's daughter at the bedside, we talked in detail about patient's condition, plan of care and prognosis and all questions were addressed.     Subjective: Patient is feeling better, his dyspnea has improved, no nausea or vomiting, no chest pain. His daughter is at the bedside, apparently patient has been non compliant with salt restricted diet and became acutely dyspneic at home.   Objective: Vitals:   10/22/19 1900 10/23/19 0001 10/23/19 0658 10/23/19 0808  BP:  (!) 125/46  (!) 132/49  Pulse:  68  82  Resp:  19  18  Temp:  98 F (36.7 C)  98 F (36.7 C)  TempSrc:  Oral  Oral  SpO2: 94% 100%  92%  Weight:   82.1 kg   Height:        Intake/Output Summary (Last 24 hours) at 10/23/2019 1104 Last data filed at 10/23/2019 0657 Gross per 24 hour  Intake --  Output 2400 ml  Net -2400 ml   Filed Weights   10/20/19 1117 10/22/19 0500 10/23/19 0658  Weight: 72.2 kg 78.2 kg 82.1 kg    Examination:   General: deconditioned  Neurology: Awake and alert, non focal  E ENT: mild pallor, no icterus, oral mucosa moist Cardiovascular: mild external JVD. S1-S2 present, rhythmic, no gallops, rubs, or murmurs. No lower extremity edema. Pulmonary: positive breath sounds bilaterally, decreased air movement, no wheezing or rhonchi, but scattered rales. Gastrointestinal. Abdomen soft and non tender Skin. No rashes Musculoskeletal: no joint deformities     Data Reviewed: I have personally reviewed following labs and imaging studies  CBC: Recent Labs  Lab 10/20/19 0629 10/20/19 2041 10/21/19 0133  WBC 13.9* 9.1  --   NEUTROABS 12.2*  --   --   HGB 8.4* 7.9* 8.2*  HCT 27.8* 25.9*  --   MCV 87.7 86.9  --    PLT 345 311  --    Basic Metabolic Panel: Recent Labs  Lab 10/20/19 0629 10/20/19 2041 10/21/19 0508 10/22/19 0534 10/23/19 0454  NA 144  --  139 130* 132*  K 4.7  --  4.3 4.4 4.6  CL 87*  --  85* 82* 82*  CO2 43*  --  38* 42* 40*  GLUCOSE 180*  --  149* 178* 118*  BUN 16  --  18 23 29*  CREATININE 0.93 0.88 0.75 0.65 0.82  CALCIUM 9.2  --  8.6* 8.6* 8.9   GFR: Estimated Creatinine Clearance: 61.7 mL/min (by C-G formula based on SCr of 0.82 mg/dL). Liver Function Tests: Recent Labs  Lab 10/20/19 0629  AST 31  ALT 24  ALKPHOS 60  BILITOT 0.8  PROT 7.0  ALBUMIN 3.5   Recent Labs  Lab 10/20/19 0629  LIPASE 18   No results for input(s): AMMONIA in the last 168 hours. Coagulation Profile: Recent Labs  Lab 10/20/19 0629  INR 1.0   Cardiac Enzymes: No results for input(s): CKTOTAL, CKMB, CKMBINDEX, TROPONINI in the last 168 hours. BNP (last 3  results) No results for input(s): PROBNP in the last 8760 hours. HbA1C: No results for input(s): HGBA1C in the last 72 hours. CBG: No results for input(s): GLUCAP in the last 168 hours. Lipid Profile: No results for input(s): CHOL, HDL, LDLCALC, TRIG, CHOLHDL, LDLDIRECT in the last 72 hours. Thyroid Function Tests: No results for input(s): TSH, T4TOTAL, FREET4, T3FREE, THYROIDAB in the last 72 hours. Anemia Panel: No results for input(s): VITAMINB12, FOLATE, FERRITIN, TIBC, IRON, RETICCTPCT in the last 72 hours.    Radiology Studies: I have reviewed all of the imaging during this hospital visit personally     Scheduled Meds:  amLODipine  10 mg Oral Daily   ascorbic acid  500 mg Oral BID   carvedilol  6.25 mg Oral BID WC   docusate sodium  100 mg Oral BID   enalapril  2.5 mg Oral Daily   enoxaparin (LOVENOX) injection  40 mg Subcutaneous Q24H   feeding supplement (ENSURE ENLIVE)  237 mL Oral TID BM   ferrous sulfate  325 mg Oral BID WC   fluticasone furoate-vilanterol  1 puff Inhalation Daily    furosemide  40 mg Intravenous Q12H   ipratropium-albuterol  3 mL Nebulization TID   montelukast  10 mg Oral Daily   multivitamin with minerals  1 tablet Oral Daily   pantoprazole  40 mg Oral Daily   tamsulosin  0.4 mg Oral QPC breakfast   Continuous Infusions:   LOS: 3 days        Ondria Oswald Annett Gula, MD

## 2019-10-23 NOTE — Progress Notes (Signed)
Physical Therapy Treatment Patient Details Name: Douglas Mathis MRN: [id number] DOB: 07/20/1935 Today's Date: 10/23/2019    History of Present Illness Per MD notes: Pt is an 84 y.o. male with a known history of chronic diastolic congestive heart failure, hypertension, chronic respiratory failure with COPD on chronically 3 L of oxygen, chronic hypercapnia, and BPH comes to the emergency room lethargic, poor oxygen saturation at home in the 70s according to the daughter and shortness of breath.  MD assessment includes:  Acute metabolic encephalopathy suspected due to hypoxia and chronic hypercapnia in the setting of acute on chronic diastolic heart failure and COPD exacerbation, chronic respiratory failure with COPD chronic hypercapnia, history of recurrent falls, and chronic anemia.    PT Comments    Pt was pleasant and motivated to participate during the session but was ultimately quite limited functionally.  Pt required assist with bed mobility tasks and was unable to come to complete standing secondary to fatigue/weakness.  Pt was able to perform several small lateral scoot transfers before requesting to return to supine secondary to fatigue.  Unable to obtain SpO2 during session secondary to poor signal, nursing notified.  Pt will benefit from PT services in a SNF setting upon discharge to safely address deficits listed in patient problem list for decreased caregiver assistance and eventual return to PLOF.     Follow Up Recommendations  SNF     Equipment Recommendations  Rolling walker with 5" wheels    Recommendations for Other Services       Precautions / Restrictions Precautions Precautions: Fall Restrictions Weight Bearing Restrictions: No    Mobility  Bed Mobility Overal bed mobility: Needs Assistance Bed Mobility: Supine to Sit;Sit to Supine Rolling: Min assist   Supine to sit: Min assist     General bed mobility comments: Min A for BLE and trunk  control  Transfers     Transfers: Lateral/Scoot Transfers           General transfer comment: Pt able to laterally scoot sever times at the EOB but unable to come to full upright position secondary to fatigue per patient  Ambulation/Gait             General Gait Details: Unable   Stairs             Wheelchair Mobility    Modified Rankin (Stroke Patients Only)       Balance Overall balance assessment: Needs assistance Sitting-balance support: Feet supported Sitting balance-Leahy Scale: Good                                      Cognition Arousal/Alertness: Awake/alert Behavior During Therapy: WFL for tasks assessed/performed Overall Cognitive Status: No family/caregiver present to determine baseline cognitive functioning                                        Exercises Total Joint Exercises Ankle Circles/Pumps: Strengthening;AROM;Both;10 reps;5 reps Quad Sets: Strengthening;Both;5 reps;10 reps Gluteal Sets: Strengthening;Both;5 reps;10 reps Heel Slides: AROM;Strengthening;Both;5 reps Hip ABduction/ADduction: AROM;Strengthening;Both;5 reps Straight Leg Raises: AROM;Strengthening;Both;5 reps Long Arc Quad: AROM;Strengthening;Both;10 reps Knee Flexion: AROM;Both;10 reps;Strengthening    General Comments        Pertinent Vitals/Pain Pain Assessment: No/denies pain    Home Living  Prior Function            PT Goals (current goals can now be found in the care plan section) Progress towards PT goals: Not progressing toward goals - comment (Limited by fatigue)    Frequency    Min 2X/week      PT Plan Current plan remains appropriate    Co-evaluation              AM-PAC PT "6 Clicks" Mobility   Outcome Measure  Help needed turning from your back to your side while in a flat bed without using bedrails?: A Little Help needed moving from lying on your back to sitting on  the side of a flat bed without using bedrails?: A Little Help needed moving to and from a bed to a chair (including a wheelchair)?: A Little Help needed standing up from a chair using your arms (e.g., wheelchair or bedside chair)?: A Little Help needed to walk in hospital room?: Total Help needed climbing 3-5 steps with a railing? : Total 6 Click Score: 14    End of Session Equipment Utilized During Treatment: Oxygen Activity Tolerance: Patient limited by fatigue Patient left: in bed;with call bell/phone within reach;with bed alarm set Nurse Communication: Mobility status;Other (comment) (Unable to obtain SpO2 reading during session due to poor signal) PT Visit Diagnosis: Unsteadiness on feet (R26.81);Muscle weakness (generalized) (M62.81);History of falling (Z91.81);Difficulty in walking, not elsewhere classified (R26.2)     Time: 1950-9326 PT Time Calculation (min) (ACUTE ONLY): 29 min  Charges:  $Therapeutic Exercise: 8-22 mins $Therapeutic Activity: 8-22 mins                    D. Scott Sajad Glander PT, DPT 10/23/19, 11:42 AM

## 2019-10-24 DIAGNOSIS — Z515 Encounter for palliative care: Secondary | ICD-10-CM

## 2019-10-24 LAB — BASIC METABOLIC PANEL
Anion gap: 10 (ref 5–15)
BUN: 30 mg/dL — ABNORMAL HIGH (ref 8–23)
CO2: 41 mmol/L — ABNORMAL HIGH (ref 22–32)
Calcium: 8.5 mg/dL — ABNORMAL LOW (ref 8.9–10.3)
Chloride: 81 mmol/L — ABNORMAL LOW (ref 98–111)
Creatinine, Ser: 0.78 mg/dL (ref 0.61–1.24)
GFR calc Af Amer: >60 mL/min (ref >60)
GFR calc non Af Amer: >60 mL/min (ref >60)
Glucose, Bld: 135 mg/dL — ABNORMAL HIGH (ref 70–99)
Potassium: 4.5 mmol/L (ref 3.5–5.1)
Sodium: 132 mmol/L — ABNORMAL LOW (ref 135–145)

## 2019-10-24 LAB — SARS CORONAVIRUS 2 BY RT PCR (HOSPITAL ORDER, PERFORMED IN ~~LOC~~ HOSPITAL LAB): SARS Coronavirus 2: NEGATIVE

## 2019-10-24 MED ORDER — FUROSEMIDE 20 MG PO TABS: 60.0000 mg | ORAL_TABLET | Freq: Two times a day (BID) | ORAL | 0 refills | Status: AC

## 2019-10-24 NOTE — Progress Notes (Signed)
[facility] Room 146 [employer] [facility]) Hospital Liaison RN note:  New referral received from Lillia Carmel, NP,  for out patient palliative care through [facility] to follow once discharged.  Information given to Palliative admin and we will follow for disposition.  Thank you for this referral.  Cyndra Numbers, RN [facility] Liaison [phone]

## 2019-10-24 NOTE — TOC Progression Note (Signed)
Transition of Care [facility]) - Progression Note    Patient Details  Name: Douglas Mathis MRN: [id number] Date of Birth: Mar 01, 1935  Transition of Care [facility]) CM/SW Contact  Barrie Dunker, RN Phone Number: 10/24/2019, 12:08 PM  Clinical Narrative:   Received a call from [facility] requesting more information.  Spoke with Shon Hale at [facility], Provided the NPI for [facility] and the facility address    Expected Discharge Plan: Home w Home Health Services Barriers to Discharge: Continued Medical Work up  Expected Discharge Plan and Services Expected Discharge Plan: Home w Home Health Services       Living arrangements for the past 2 months: Single Family Home Expected Discharge Date: 10/24/19                                     Social Determinants of Health (SDOH) Interventions    Readmission Risk Interventions No flowsheet data found.

## 2019-10-24 NOTE — Progress Notes (Signed)
Called report to April at [employer]. Answered all questions.

## 2019-10-24 NOTE — TOC Progression Note (Signed)
Transition of Care [facility]) - Progression Note    Patient Details  Name: BRYSUN ESCHMANN MRN: [id number] Date of Birth: 10/09/1935  Transition of Care [facility]) CM/SW Contact  Barrie Dunker, RN Phone Number: 10/24/2019, 3:00 PM  Clinical Narrative:   Called EMS to transport to [place] in [place], The patient is 4th on the list, The bedside nurse is aware    Expected Discharge Plan: Home w Home Health Services Barriers to Discharge: Continued Medical Work up  Expected Discharge Plan and Services Expected Discharge Plan: Home w Home Health Services       Living arrangements for the past 2 months: Single Family Home Expected Discharge Date: 10/24/19                                     Social Determinants of Health (SDOH) Interventions    Readmission Risk Interventions No flowsheet data found.

## 2019-10-24 NOTE — Discharge Summary (Addendum)
Physician Discharge Summary  Douglas Mathis ZOX:[id number] DOB: 08-Feb-1936 DOA: 10/20/2019  PCP: [facility]  Admit date: 10/20/2019 Discharge date: 10/24/2019  Admitted From: Home  Disposition:  SNF  Recommendations for Outpatient Follow-up and new medication changes:  1. Follow up with [facility]. 2. Please follow up on renal function and electrolytes in 3 days, follow on serum Potassium and Sodium.  3. Patient will follow with palliative care as outpatient.   Home Health: na   Equipment/Devices: na    Discharge Condition: stable  CODE STATUS: DNR   Diet recommendation: heart healthy. 2 gram sodium restriction.   Brief/Interim Summary: Patient admitted to the hospital with a working diagnosis of acute on chronic hypoxic/hypercapnic respiratory failure due to diastolic heart failure exacerbation/ COPD exacerbation  84 year old male with a past medical history of chronic diastolic heart failure, hypertension, chronic hypoxic respiratory failure due to COPD, chronic hypercapnic respiratory failureandBPH.Patient was brought to the emergency room lethargic, his oxygenation was 70% and he had increased work of breathing. Unable to get further history due to patient's altered mentation and respiratory distress. Per his daughter patient got acutely dyspneic the day of admission and has not been following a salt restricted diet at home.  In the emergency department patient was placed on noninvasive mechanical ventilation/BiPAP.His blood pressure was 123/63, heart rate 74, temperature 98.2, respiratory rate 20, oxygen saturation 100% on noninvasive mechanical ventilation. His lungs had decreased breath sounds bilaterally with no rhonchi or wheezing, heart S1-S2, present rhythmic, soft abdomen, no lower extremity edema. Arterial blood gas pH 7.42, P CO2 69, PaO2 63,bicarb 44.8,serum sodium 144, potassium 4.7, chloride 87, bicarb 43,  glucose 180,BUN 16, creatinine 0.93, troponin I 14, BNP 353, hemoglobin 8.4, hematocrit 27.8, platelets 345. SARS COVID-19 negative. Urine analysis with specific gravity 1.010, 30 protein, more than 50 red cells, 11-20 white cells. Chest radiograph with hyperinflation, increased lung markings bilaterally, positive hilar vascular congestion, left loculated pleural effusions. EKG 80 bpm,left axis deviation with left anterior fascicular block, right bundle branch block, sinus rhythm with PACs/PVCs, no significant ST segment or T wave changes  CT chest from 08/20 with no PE, chronic loculated bilateral pleural effusions and positive emphysema.  Patient responded well to diuresis, but continue to be very weak and deconditioned, physical/ occupational therapy have recommended SNF placement.  1.  Acute on chronic hypoxemic/hypercapnic respiratory failure due to acute on chronic diastolic heart failure exacerbation, COPD exacerbation.  Patient was admitted to the medical ward, he was treated with aggressive diuresis with intravenous furosemide along with bronchodilators.  He was successfully liberated from mechanical ventilation down to nasal cannula with good toleration.  Negative fluid balance was achieved, approximately 4, 900 mL since admission with significant improvement of his symptoms.  At discharge his dyspnea has improved, his oxygenation is 100% on 3 to 4 L of supplemental oxygen per nasal cannula.  Furosemide has been increased to 60 mg twice daily.  Continue bronchodilator therapy.  2.  COPD exacerbation.  Patient with chronic hypoxic respiratory failure, at home on 2 to 3 L per supplemental oxygen per nasal cannula.  His CT chest has significant emphysematous changes. His respiratory distress was mainly driven by acute cardiogenic pulmonary edema, systemic steroids were discontinued. Patient responded well to bronchodilators.  A trial of CPAP at night was not tolerated.  3.   Hypertension.  Continue blood pressure control with amlodipine and carvedilol.  4.  BPH.  Continue tamsulosin, no signs of urinary  retention.  5.  Iron deficiency anemia.  Hemoglobin and hematocrit remained stable, continue iron supplementation.  6.  Ambulatory dysfunction.  Patient noted to be very weak and deconditioned, will continue care at the skilled nursing facility.  7.  Hyponatremia/hyperkalemia.  Patient was diuresed with furosemide with good toleration, his discharge sodium is 132, potassium 4.5, chloride 81, bicarb 41, glucose 135, BUN 31 creatinine 0.78. Old records personally reviewed, his serum potassium has been up to 5.7 on August 21, for now we will hold on potassium supplements and recommend follow-up kidney function in 3 days.   Discharge Diagnoses:  Principal Problem:   Acute on chronic respiratory failure with hypoxemia (HCC) Active Problems:   COPD exacerbation (HCC)   Acute on chronic diastolic CHF (congestive heart failure) (HCC)   HTN (hypertension)   GERD (gastroesophageal reflux disease)   BPH (benign prostatic hyperplasia)   Goals of care, counseling/discussion   Palliative care by specialist    Discharge Instructions   Allergies as of 10/24/2019   No Known Allergies     Medication List    STOP taking these medications   DOK 100 MG capsule Generic drug: docusate sodium     TAKE these medications   amLODipine 10 MG tablet Commonly known as: NORVASC Take 10 mg by mouth daily.   carvedilol 6.25 MG tablet Commonly known as: COREG Take 6.25 mg by mouth 2 (two) times daily with a meal.   feeding supplement (ENSURE ENLIVE) Liqd Take 237 mLs by mouth 3 (three) times daily between meals.   ferrous sulfate 325 (65 FE) MG tablet Take 1 tablet (325 mg total) by mouth 2 (two) times daily with a meal.   Fluticasone-Umeclidin-Vilant 100-62.5-25 MCG/INH Aepb Inhale 1 puff into the lungs daily.   furosemide 20 MG tablet Commonly known as:  LASIX Take 3 tablets (60 mg total) by mouth 2 (two) times daily. What changed:   medication strength  how much to take  when to take this   ipratropium-albuterol 0.5-2.5 (3) MG/3ML Soln Commonly known as: DUONEB Take 3 mLs by nebulization every 6 (six) hours as needed (shortness of breath or wheezing).   montelukast 10 MG tablet Commonly known as: SINGULAIR Take 10 mg by mouth daily.   multivitamin with minerals Tabs tablet Take 1 tablet by mouth daily.   pantoprazole 40 MG tablet Commonly known as: PROTONIX Take 40 mg by mouth daily.   ProAir HFA 108 (90 Base) MCG/ACT inhaler Generic drug: albuterol Inhale 2 puffs into the lungs 4 (four) times daily as needed for wheezing or shortness of breath.   tamsulosin 0.4 MG Caps capsule Commonly known as: FLOMAX Take 0.4 mg by mouth daily after breakfast.   Vitamin C 500 MG Caps Take 500 mg by mouth 2 (two) times daily.       Contact information for follow-up providers    [facility] Follow up on 11/02/2019.   Specialty: Cardiology Why: at 12:30pm. Enter through the [facility] entrance Contact information: [address] [place] [address] [phone]       [facility] Follow up in 1 week(s).   Specialty: General Practice Contact information: [address] [place] [address] [phone]            Contact information for after-discharge care    Destination    [facility] .   Service: Skilled Nursing Contact information: [address] [place] [address]  [phone]                 No Known Allergies     Procedures/Studies: CT HEAD WO CONTRAST  Result Date: 10/06/2019 CLINICAL DATA:  Fall EXAM: CT HEAD WITHOUT CONTRAST TECHNIQUE: Contiguous axial images were obtained from the base of the skull through the vertex without intravenous contrast.  COMPARISON:  06/26/2019 FINDINGS: Brain: No acute intracranial abnormality. Specifically, no hemorrhage, hydrocephalus, mass lesion, acute infarction, or significant intracranial injury. Vascular: No hyperdense vessel or unexpected calcification. Skull: No acute calvarial abnormality. Sinuses/Orbits: Visualized paranasal sinuses and mastoids clear. Orbital soft tissues unremarkable. Other: None IMPRESSION: No acute intracranial abnormality. Electronically Signed   By: Charlett Nose M.D.   On: 10/06/2019 22:13   CT ANGIO CHEST PE W OR WO CONTRAST  Result Date: 10/13/2019 CLINICAL DATA:  Syncope, acute hypoxia EXAM: CT ANGIOGRAPHY CHEST WITH CONTRAST TECHNIQUE: Multidetector CT imaging of the chest was performed using the standard protocol during bolus administration of intravenous contrast. Multiplanar CT image reconstructions and MIPs were obtained to evaluate the vascular anatomy. CONTRAST:  55mL OMNIPAQUE IOHEXOL 350 MG/ML SOLN COMPARISON:  CT chest 10/06/2019 FINDINGS: Cardiovascular: Satisfactory opacification of the pulmonary arteries to the segmental level. No evidence of pulmonary embolism. Normal heart size. No pericardial effusion. Thoracic aortic atherosclerosis. Multi vessel coronary artery atherosclerosis. Mediastinum/Nodes: No enlarged mediastinal, hilar, or axillary lymph nodes. Thyroid gland, trachea, and esophagus demonstrate no significant findings. Lungs/Pleura: Bilateral small partially loculated pleural effusions. Bibasilar airspace disease likely reflecting dependent atelectasis. No focal consolidation. No pneumothorax. There is bilateral centrilobular emphysema. Upper Abdomen: Partially visualized pneumobilia. No acute abdominal or pelvic pathology. Musculoskeletal: No acute osseous abnormality. No aggressive osseous lesion. T4 vertebral body compression fracture with approximately 50% anterior height loss similar to the prior examination of 10/13/2019. Review of the MIP images confirms the  above findings. IMPRESSION: 1. No evidence of pulmonary embolus. 2. New bilateral small partially loculated pleural effusions with bibasilar airspace disease likely reflecting dependent atelectasis. 3. T4 vertebral body compression fracture with approximately 50% anterior height loss similar to the prior examination of 10/13/2019. 4. Aortic Atherosclerosis (ICD10-I70.0) and Emphysema (ICD10-J43.9). Electronically Signed   By: Elige Ko   On: 10/13/2019 14:47   CT CERVICAL SPINE WO CONTRAST  Result Date: 10/06/2019 CLINICAL DATA:  Neck pain EXAM: CT CERVICAL SPINE WITHOUT CONTRAST TECHNIQUE: Multidetector CT imaging of the cervical spine was performed without intravenous contrast. Multiplanar CT image reconstructions were also generated. COMPARISON:  06/10/2016 FINDINGS: Alignment: Normal Skull base and vertebrae: No acute fracture. No primary bone lesion or focal pathologic process. Soft tissues and spinal canal: No prevertebral fluid or swelling. No visible canal hematoma. Disc levels:  Diffuse degenerative disc disease and facet disease. Upper chest: No acute findings Other: None IMPRESSION: Degenerative disc and facet disease.  No acute bony abnormality. Electronically Signed   By: Charlett Nose M.D.   On: 10/06/2019 22:15   DG Pelvis Portable  Result Date: 10/06/2019 CLINICAL DATA:  Mechanical fall EXAM: PORTABLE PELVIS 1-2 VIEWS COMPARISON:  None. FINDINGS: There is no evidence of pelvic fracture or diastasis. No pelvic bone lesions are seen. Slightly limited evaluation of the right pubic rami due to positioning. There are vascular calcifications. IMPRESSION: No definite acute osseous abnormality Electronically Signed   By: Jasmine Pang M.D.   On: 10/06/2019 19:56   CT CHEST ABDOMEN PELVIS W CONTRAST  Result Date: 10/06/2019 CLINICAL DATA:  Fall change in mental status EXAM: CT CHEST, ABDOMEN, AND PELVIS WITH CONTRAST  TECHNIQUE: Multidetector CT imaging of the chest, abdomen and pelvis was  performed following the standard protocol during bolus administration of intravenous contrast. CONTRAST:  OMNIPAQUE IOHEXOL 300 MG/ML  SOLN COMPARISON:  None. FINDINGS: Cardiovascular: There is mild cardiomegaly. Coronary artery calcifications are present. There is scattered aortic atherosclerosis noted. Calcifications are also seen within the mitral valve and aortic valve. No significant pericardial fluid/thickening. Great vessels are normal in course and caliber. No evidence of acute thoracic aortic injury. No central pulmonary emboli. Mediastinum/Nodes: No pneumomediastinum. No mediastinal hematoma. Unremarkable esophagus. No axillary, mediastinal or hilar lymphadenopathy. Lungs/Pleura:Again noted is areas of pleural thickening and calcifications at the left lung base. There is stable areas of bronchiectasis and scarring at the left lung base. No large airspace consolidation or pleural effusion are seen. There is centrilobular emphysematous changes seen at both lung apices with scarring. No pneumothorax. Musculoskeletal: There is a obliquely oriented fracture seen through the T3 spinous process and the right T3 inferior articulating facet. There is an acute superior compression fracture of the T4 vertebral body with slight buckling of the posterosuperior cortex. No retropulsion of fragments however is seen. Abdomen/pelvis: Hepatobiliary: Homogeneous hepatic attenuation without traumatic injury. No focal lesion. The patient is status post cholecystectomy. A small amount of pneumobilia is present which could be from prior intervention. For. Pancreas: No evidence for traumatic injury. Portions are partially obscured by adjacent bowel loops and paucity of intra-abdominal fat. No ductal dilatation or inflammation. Spleen: Homogeneous attenuation without traumatic injury. Normal in size. Adrenals/Urinary Tract: No adrenal hemorrhage. Kidneys demonstrate symmetric enhancement and excretion on delayed phase  imaging. No evidence or renal injury. Ureters are well opacified proximal through mid portion. Bladder is physiologically distended without wall thickening. Stomach/Bowel: Suboptimally assessed without enteric contrast, allowing for this, no evidence of bowel injury. Stomach physiologically distended. There are no dilated or thickened small or large bowel loops. Moderate to large amount of colonic stool is present. Scattered colonic diverticula are noted. No evidence of mesenteric hematoma. No free air free fluid. Vascular/Lymphatic: No acute vascular injury. The abdominal aorta and IVC are intact. No evidence of retroperitoneal, abdominal, or pelvic adenopathy. Reproductive: No acute abnormality. Heterogeneously enlarged prostate gland is noted. Other: No focal contusion or abnormality of the abdominal wall. Musculoskeletal: No acute fracture of the lumbar spine or bony pelvis. IMPRESSION: Nondisplaced T3 spinous process and right inferior articulating facet fracture. Probable acute superior compression fracture of the T4 vertebral body with less than 25% loss in height. No other acute intrathoracic, abdominal, or pelvic injury. Aortic Atherosclerosis (ICD10-I70.0). Electronically Signed   By: Jonna Clark M.D.   On: 10/06/2019 22:21   DG Chest Port 1 View  Result Date: 10/20/2019 CLINICAL DATA:  Acute dyspnea.  Hypoxia. EXAM: PORTABLE CHEST 1 VIEW COMPARISON:  CT 10/13/2019.  Chest x-ray 10/06/2019. FINDINGS: Mediastinum and hilar structures normal. Stable cardiomegaly. Diffuse bilateral pulmonary infiltrates/edema noted on today's exam. Small bilateral pleural effusions. No pneumothorax. IMPRESSION: 1. Diffuse bilateral pulmonary infiltrates/edema noted on today's exam. Small bilateral pleural effusions also noted. 2.  Stable cardiomegaly Electronically Signed   By: Maisie Fus  Register   On: 10/20/2019 07:02   DG Chest Port 1 View  Result Date: 10/06/2019 CLINICAL DATA:  84 year old male with fall. EXAM:  PORTABLE CHEST 1 VIEW COMPARISON:  Chest radiograph dated 06/25/2019. FINDINGS: Background of emphysema. There is a small, likely chronic left pleural effusion and left pleural thickening along the left lung base. Right lung base reticulonodular densities appears similar to prior radiograph,  and likely chronic. No new consolidation. There is no pneumothorax. Mild cardiomegaly. Atherosclerotic calcification of the aorta. No acute osseous pathology. IMPRESSION: 1. No acute cardiopulmonary process.  No interval change. 2. Emphysema and chronic left pleural thickening. Electronically Signed   By: Elgie Collard M.D.   On: 10/06/2019 19:55        Subjective: Patient continue to improve dyspnea, continue to be very weak and deconditioned. No nausea or vomiting.   Discharge Exam: Vitals:   10/23/19 2358 10/24/19 0843  BP: (!) 138/50 (!) 107/47  Pulse: 72 73  Resp: 16 18  Temp: 97.9 F (36.6 C) 97.9 F (36.6 C)  SpO2: (!) 85% 100%   Vitals:   10/23/19 1544 10/23/19 2110 10/23/19 2358 10/24/19 0843  BP: (!) 114/48  (!) 138/50 (!) 107/47  Pulse: 66  72 73  Resp: 18  16 18   Temp: 97.8 F (36.6 C)  97.9 F (36.6 C) 97.9 F (36.6 C)  TempSrc: Oral  Oral Oral  SpO2: 100% 95% (!) 85% 100%  Weight:      Height:        General: Not in pain or dyspnea, deconditioned  Neurology: Awake and alert, non focal  E ENT: no pallor, no icterus, oral mucosa moist Cardiovascular: No JVD. S1-S2 present, rhythmic, no gallops, rubs, or murmurs. No lower extremity edema. Pulmonary: decreased breath sounds bilaterally, adequate air movement, no wheezing, with scattered rhonchi and rales. Gastrointestinal. Abdomen soft and non tender Skin. No rashes Musculoskeletal: no joint deformities   The results of significant diagnostics from this hospitalization (including imaging, microbiology, ancillary and laboratory) are listed below for reference.     Microbiology: Recent Results (from the past 240  hour(s))  Urine culture     Status: Abnormal   Collection Time: 10/20/19  6:39 AM   Specimen: Urine, Random  Result Value Ref Range Status   Specimen Description   Final    URINE, RANDOM Performed at [facility], [address], [place], [place]    Special Requests   Final    NONE Performed at [facility], [address] [place], [place]    Culture (A)  Final    >=100,000 COLONIES/mL KLEBSIELLA PNEUMONIAE Confirmed Extended Spectrum Beta-Lactamase Producer (ESBL).  In bloodstream infections from ESBL organisms, carbapenems are preferred over piperacillin/tazobactam. They are shown to have a lower risk of mortality.    Report Status 10/23/2019 FINAL  Final   Organism ID, Bacteria KLEBSIELLA PNEUMONIAE (A)  Final      Susceptibility   Klebsiella pneumoniae - MIC*    AMPICILLIN >=32 RESISTANT Resistant     CEFAZOLIN >=64 RESISTANT Resistant     CEFTRIAXONE >=64 RESISTANT Resistant     CIPROFLOXACIN <=0.25 SENSITIVE Sensitive     GENTAMICIN <=1 SENSITIVE Sensitive     IMIPENEM <=0.25 SENSITIVE Sensitive     NITROFURANTOIN 128 RESISTANT Resistant     TRIMETH/SULFA >=320 RESISTANT Resistant     AMPICILLIN/SULBACTAM >=32 RESISTANT Resistant     PIP/TAZO <=4 SENSITIVE Sensitive     * >=100,000 COLONIES/mL KLEBSIELLA PNEUMONIAE  SARS Coronavirus 2 by RT PCR (hospital order, performed in [facility] hospital lab) Nasopharyngeal Nasopharyngeal Swab     Status: None   Collection Time: 10/20/19  7:40 AM   Specimen: Nasopharyngeal Swab  Result Value Ref Range Status   SARS Coronavirus 2 NEGATIVE NEGATIVE Final    Comment: (NOTE) SARS-CoV-2 target nucleic acids are NOT DETECTED.  The SARS-CoV-2 RNA is generally  detectable in upper and lower respiratory specimens during the acute phase of infection. The lowest concentration of SARS-CoV-2 viral copies this assay can detect is 250 copies / mL. A negative result does not preclude SARS-CoV-2  infection and should not be used as the sole basis for treatment or other patient management decisions.  A negative result may occur with improper specimen collection / handling, submission of specimen other than nasopharyngeal swab, presence of viral mutation(s) within the areas targeted by this assay, and inadequate number of viral copies (<250 copies / mL). A negative result must be combined with clinical observations, patient history, and epidemiological information.  Fact Sheet for Patients:   BoilerBrush.com.cy  Fact Sheet for Healthcare Providers: https://pope.com/  This test is not yet approved or  cleared by the [place] FDA and has been authorized for detection and/or diagnosis of SARS-CoV-2 by FDA under an Emergency Use Authorization (EUA).  This EUA will remain in effect (meaning this test can be used) for the duration of the COVID-19 declaration under Section 564(b)(1) of the Act, 21 U.S.C. section 360bbb-3(b)(1), unless the authorization is terminated or revoked sooner.  Performed at [facility], [address] [place], [place] [address]   Blood culture (routine single)     Status: None (Preliminary result)   Collection Time: 10/20/19  7:40 AM   Specimen: BLOOD  Result Value Ref Range Status   Specimen Description BLOOD LEFT AC  Final   Special Requests   Final    BOTTLES DRAWN AEROBIC AND ANAEROBIC Blood Culture results may not be optimal due to an excessive volume of blood received in culture bottles   Culture   Final    NO GROWTH 4 DAYS Performed at [facility], [address] [place], [place] [address]    Report Status PENDING  Incomplete     Labs: BNP (last 3 results) Recent Labs    06/25/19 1255 10/06/19 1939 10/20/19 0629  BNP 228.0* 302.2* 353.2*   Basic Metabolic Panel: Recent Labs  Lab 10/20/19 0629 10/20/19 0629 10/20/19 2041 10/21/19 0508 10/22/19 0534  10/23/19 0454 10/24/19 0223  NA 144  --   --  139 130* 132* 132*  K 4.7  --   --  4.3 4.4 4.6 4.5  CL 87*  --   --  85* 82* 82* 81*  CO2 43*  --   --  38* 42* 40* 41*  GLUCOSE 180*  --   --  149* 178* 118* 135*  BUN 16  --   --  18 23 29* 30*  CREATININE 0.93   < > 0.88 0.75 0.65 0.82 0.78  CALCIUM 9.2  --   --  8.6* 8.6* 8.9 8.5*   < > = values in this interval not displayed.   Liver Function Tests: Recent Labs  Lab 10/20/19 0629  AST 31  ALT 24  ALKPHOS 60  BILITOT 0.8  PROT 7.0  ALBUMIN 3.5   Recent Labs  Lab 10/20/19 0629  LIPASE 18   No results for input(s): AMMONIA in the last 168 hours. CBC: Recent Labs  Lab 10/20/19 0629 10/20/19 2041 10/21/19 0133  WBC 13.9* 9.1  --   NEUTROABS 12.2*  --   --   HGB 8.4* 7.9* 8.2*  HCT 27.8* 25.9*  --   MCV 87.7 86.9  --   PLT 345 311  --    Cardiac Enzymes: No results for input(s): CKTOTAL, CKMB, CKMBINDEX, TROPONINI in the last 168 hours. BNP:  Invalid input(s): POCBNP CBG: No results for input(s): GLUCAP in the last 168 hours. D-Dimer No results for input(s): DDIMER in the last 72 hours. Hgb A1c No results for input(s): HGBA1C in the last 72 hours. Lipid Profile No results for input(s): CHOL, HDL, LDLCALC, TRIG, CHOLHDL, LDLDIRECT in the last 72 hours. Thyroid function studies No results for input(s): TSH, T4TOTAL, T3FREE, THYROIDAB in the last 72 hours.  Invalid input(s): FREET3 Anemia work up No results for input(s): VITAMINB12, FOLATE, FERRITIN, TIBC, IRON, RETICCTPCT in the last 72 hours. Urinalysis    Component Value Date/Time   COLORURINE YELLOW (A) 10/20/2019 0639   APPEARANCEUR CLOUDY (A) 10/20/2019 0639   APPEARANCEUR Cloudy 10/05/2013 0414   LABSPEC 1.010 10/20/2019 0639   LABSPEC 1.017 10/05/2013 0414   PHURINE 5.0 10/20/2019 0639   GLUCOSEU NEGATIVE 10/20/2019 0639   GLUCOSEU Negative 10/05/2013 0414   HGBUR LARGE (A) 10/20/2019 0639   BILIRUBINUR NEGATIVE 10/20/2019 0639   BILIRUBINUR  Negative 10/05/2013 0414   KETONESUR NEGATIVE 10/20/2019 0639   PROTEINUR 30 (A) 10/20/2019 0639   NITRITE NEGATIVE 10/20/2019 0639   LEUKOCYTESUR TRACE (A) 10/20/2019 0639   LEUKOCYTESUR 3+ 10/05/2013 0414   Sepsis Labs Invalid input(s): PROCALCITONIN,  WBC,  LACTICIDVEN Microbiology Recent Results (from the past 240 hour(s))  Urine culture     Status: Abnormal   Collection Time: 10/20/19  6:39 AM   Specimen: Urine, Random  Result Value Ref Range Status   Specimen Description   Final    URINE, RANDOM Performed at [facility], [address] [place], [place] [address]    Special Requests   Final    NONE Performed at [facility], [address] [place], [place] [address]    Culture (A)  Final    >=100,000 COLONIES/mL KLEBSIELLA PNEUMONIAE Confirmed Extended Spectrum Beta-Lactamase Producer (ESBL).  In bloodstream infections from ESBL organisms, carbapenems are preferred over piperacillin/tazobactam. They are shown to have a lower risk of mortality.    Report Status 10/23/2019 FINAL  Final   Organism ID, Bacteria KLEBSIELLA PNEUMONIAE (A)  Final      Susceptibility   Klebsiella pneumoniae - MIC*    AMPICILLIN >=32 RESISTANT Resistant     CEFAZOLIN >=64 RESISTANT Resistant     CEFTRIAXONE >=64 RESISTANT Resistant     CIPROFLOXACIN <=0.25 SENSITIVE Sensitive     GENTAMICIN <=1 SENSITIVE Sensitive     IMIPENEM <=0.25 SENSITIVE Sensitive     NITROFURANTOIN 128 RESISTANT Resistant     TRIMETH/SULFA >=320 RESISTANT Resistant     AMPICILLIN/SULBACTAM >=32 RESISTANT Resistant     PIP/TAZO <=4 SENSITIVE Sensitive     * >=100,000 COLONIES/mL KLEBSIELLA PNEUMONIAE  SARS Coronavirus 2 by RT PCR (hospital order, performed in [facility] hospital lab) Nasopharyngeal Nasopharyngeal Swab     Status: None   Collection Time: 10/20/19  7:40 AM   Specimen: Nasopharyngeal Swab  Result Value Ref Range Status   SARS Coronavirus 2 NEGATIVE NEGATIVE Final    Comment:  (NOTE) SARS-CoV-2 target nucleic acids are NOT DETECTED.  The SARS-CoV-2 RNA is generally detectable in upper and lower respiratory specimens during the acute phase of infection. The lowest concentration of SARS-CoV-2 viral copies this assay can detect is 250 copies / mL. A negative result does not preclude SARS-CoV-2 infection and should not be used as the sole basis for treatment or other patient management decisions.  A negative result may occur with improper specimen collection / handling, submission of specimen other than nasopharyngeal swab, presence  of viral mutation(s) within the areas targeted by this assay, and inadequate number of viral copies (<250 copies / mL). A negative result must be combined with clinical observations, patient history, and epidemiological information.  Fact Sheet for Patients:   BoilerBrush.com.cy  Fact Sheet for Healthcare Providers: https://pope.com/  This test is not yet approved or  cleared by the [place] FDA and has been authorized for detection and/or diagnosis of SARS-CoV-2 by FDA under an Emergency Use Authorization (EUA).  This EUA will remain in effect (meaning this test can be used) for the duration of the COVID-19 declaration under Section 564(b)(1) of the Act, 21 U.S.C. section 360bbb-3(b)(1), unless the authorization is terminated or revoked sooner.  Performed at [facility], [address] [place], [place] [address]   Blood culture (routine single)     Status: None (Preliminary result)   Collection Time: 10/20/19  7:40 AM   Specimen: BLOOD  Result Value Ref Range Status   Specimen Description BLOOD LEFT AC  Final   Special Requests   Final    BOTTLES DRAWN AEROBIC AND ANAEROBIC Blood Culture results may not be optimal due to an excessive volume of blood received in culture bottles   Culture   Final    NO GROWTH 4 DAYS Performed at [facility], [address], [place], [place] [address]    Report Status PENDING  Incomplete     Time coordinating discharge: 45 minutes  SIGNED:   Coralie Keens, MD  [facility] 10/24/2019, 10:20 AM

## 2019-10-24 NOTE — TOC Progression Note (Signed)
Transition of Care [facility]) - Progression Note    Patient Details  Name: Douglas Mathis MRN: [id number] Date of Birth: 18-Dec-1935  Transition of Care [facility]) CM/SW Contact  Barrie Dunker, RN Phone Number: 10/24/2019, 11:08 AM  Clinical Narrative:   Sherron Monday with Corrie Dandy the patients daughter and notified her that the patient is going to be discharged today to [facility] in [place] via EMS transport, I explained that [facility] will need the Covid vaccine records, she stated that she will find it and take to them, DC packet placed on the chart and Nurse aware    Expected Discharge Plan: Home w Home Health Services Barriers to Discharge: Continued Medical Work up  Expected Discharge Plan and Services Expected Discharge Plan: Home w Home Health Services       Living arrangements for the past 2 months: Single Family Home Expected Discharge Date: 10/24/19                                     Social Determinants of Health (SDOH) Interventions    Readmission Risk Interventions No flowsheet data found.

## 2019-10-24 NOTE — TOC Progression Note (Signed)
Transition of Care [facility]) - Progression Note    Patient Details  Name: Douglas Mathis MRN: [id number] Date of Birth: Dec 30, 1935  Transition of Care [facility]) CM/SW Contact  Barrie Dunker, RN Phone Number: 10/24/2019, 8:49 AM  Clinical Narrative:   Faxed clinical notes to [facility] for ins Berkley Harvey [phone]   Expected Discharge Plan: Home w Home Health Services Barriers to Discharge: Continued Medical Work up  Expected Discharge Plan and Services Expected Discharge Plan: Home w Home Health Services       Living arrangements for the past 2 months: Single Family Home                                       Social Determinants of Health (SDOH) Interventions    Readmission Risk Interventions No flowsheet data found.

## 2019-10-24 NOTE — TOC Progression Note (Signed)
Transition of Care [facility]) - Progression Note    Patient Details  Name: Douglas Mathis MRN: [id number] Date of Birth: March 30, 1935  Transition of Care [facility]) CM/SW Contact  Barrie Dunker, RN Phone Number: 10/24/2019, 2:31 PM  Clinical Narrative:   Received a call from [facility] with insurance approval. Ref number [id number], Auth ID number [id number], approved for 5 days, next review date is 9/4 coordinator Artist Stagout,    Expected Discharge Plan: Home w Home Health Services Barriers to Discharge: Continued Medical Work up  Expected Discharge Plan and Services Expected Discharge Plan: Home w Home Health Services       Living arrangements for the past 2 months: Single Family Home Expected Discharge Date: 10/24/19                                     Social Determinants of Health (SDOH) Interventions    Readmission Risk Interventions No flowsheet data found.

## 2019-10-25 LAB — CULTURE, BLOOD (SINGLE): Culture: NO GROWTH

## 2019-11-02 ENCOUNTER — Telehealth: Payer: Self-pay | Admitting: Family

## 2019-11-02 ENCOUNTER — Ambulatory Visit: Payer: Medicare Other | Admitting: Family

## 2019-11-02 NOTE — Telephone Encounter (Signed)
Patient did not show for his [facility] appointment on 11/02/19. Will attempt to reschedule.  °

## 2019-11-24 DEATH — deceased

## 2020-01-03 ENCOUNTER — Ambulatory Visit: Payer: Medicare Other | Admitting: Family

## 2020-07-06 IMAGING — DX DG CHEST 1V PORT
1 series · 2 of 2 positions shown · non-contrast
Comparison: 11/12/2017

CLINICAL DATA: Respiratory distress.

EXAM:
PORTABLE CHEST 1 VIEW

[Series 1: chest ap · 0.14mm/px · 2 of 2 slices shown]
[im 1/2]
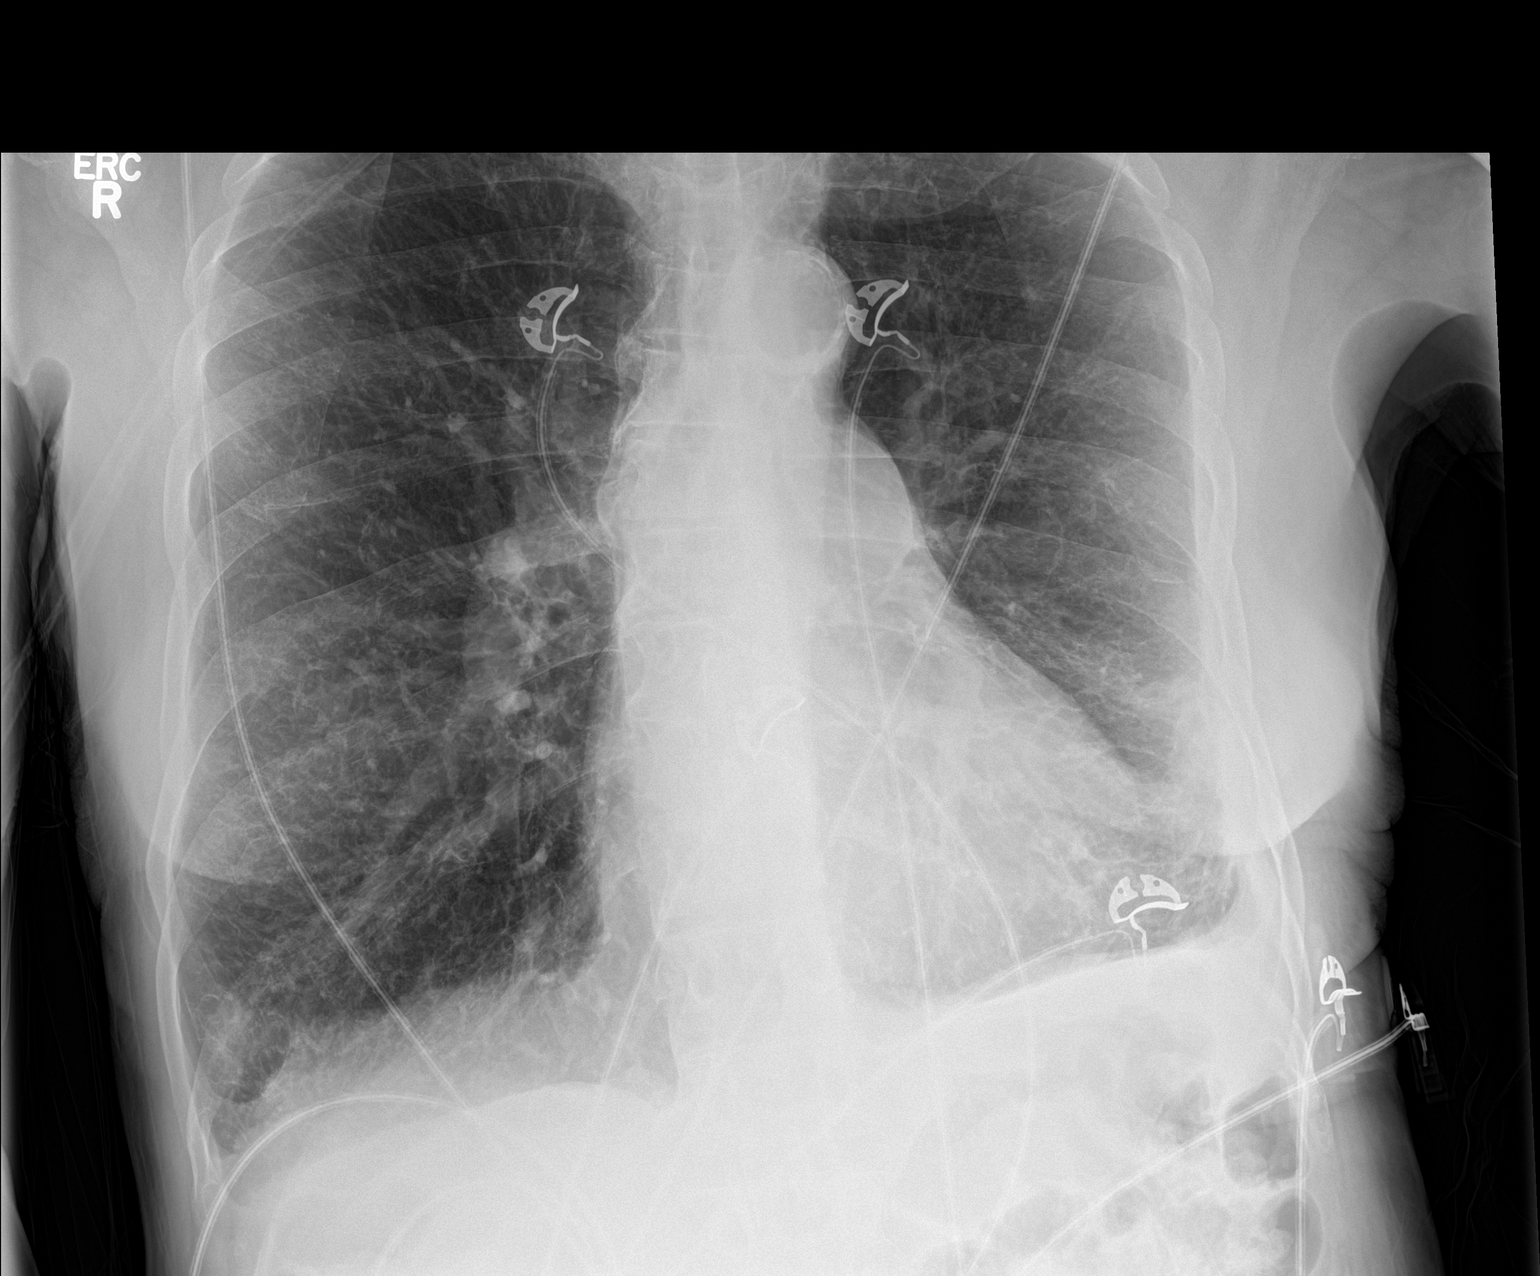
[im 2/2]
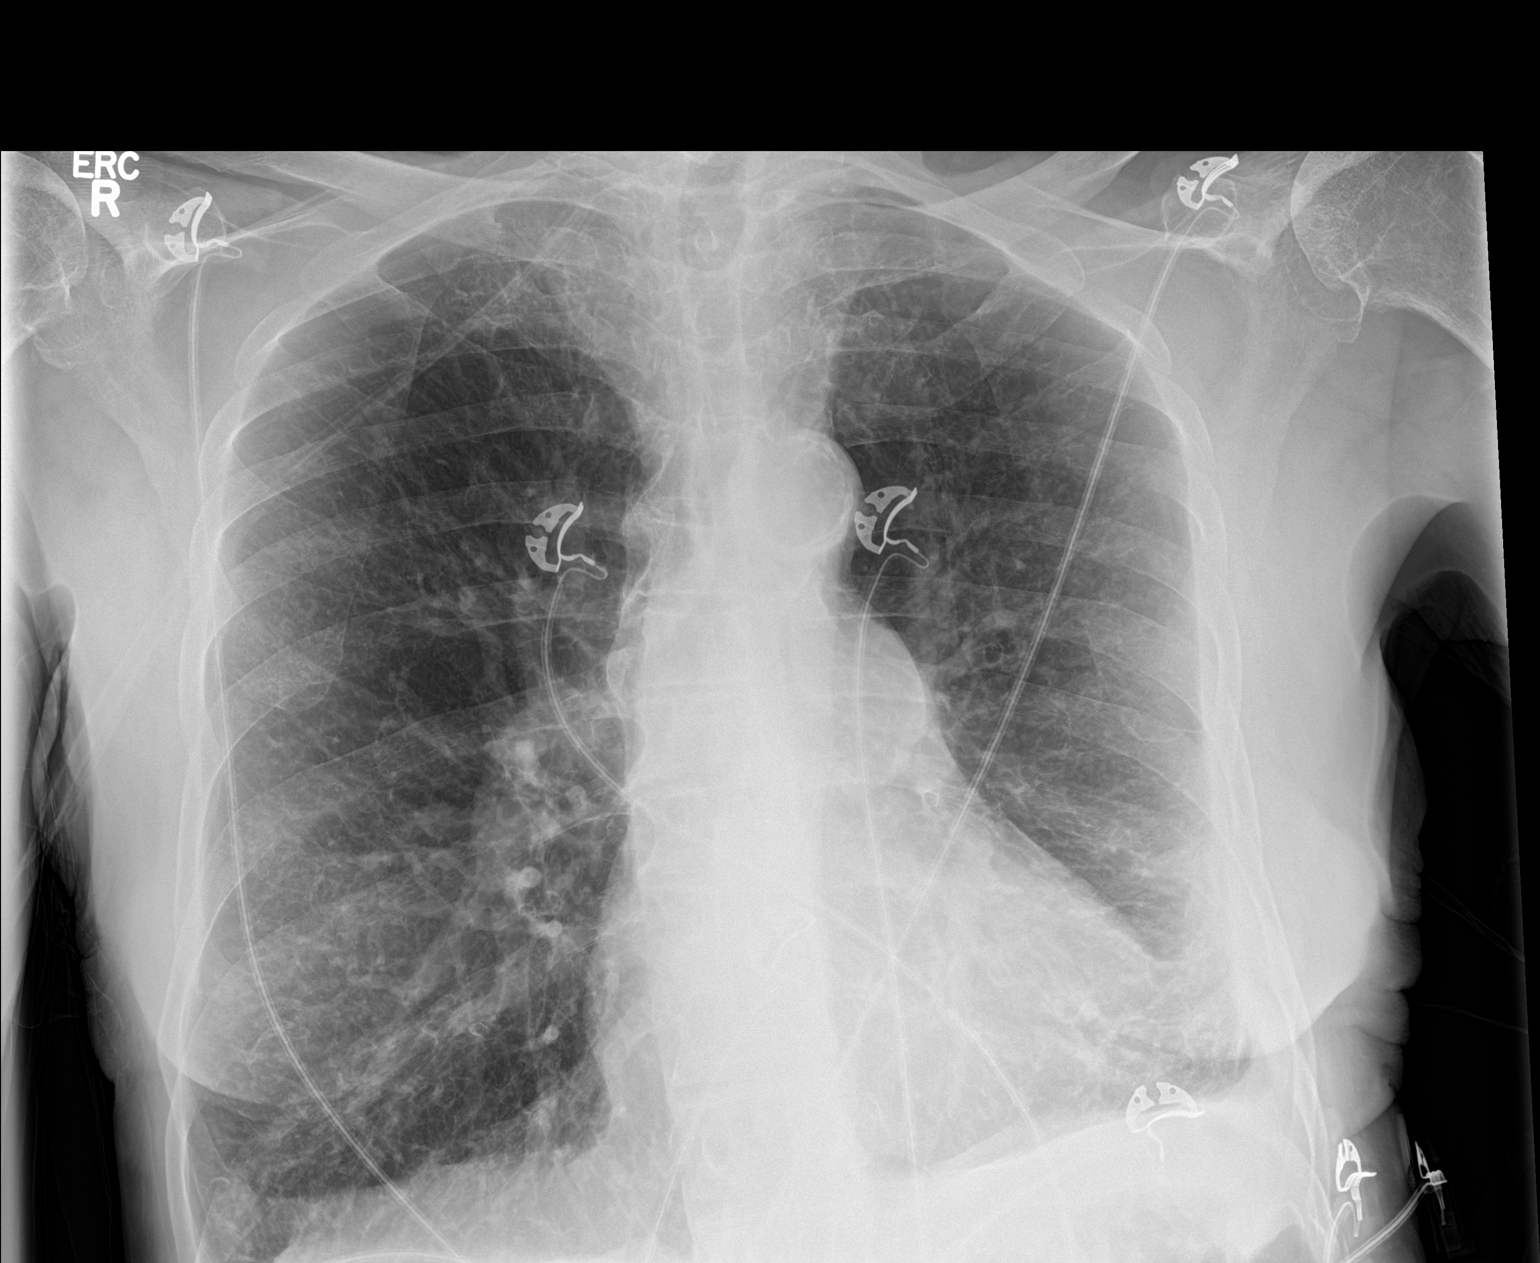

[2 of 2 positions shown; findings below may reference images not displayed]

FINDINGS: Cardiac silhouette is normal in size. No mediastinal or hilar
masses.

Lungs are hyperexpanded. There thickened interstitial markings
bilaterally. Pleuroparenchymal scarring is noted at the left lung
base. Possible small effusions. Interstitial thickening appears
mildly increased when compared to the prior exam. No lung
consolidation to suggest pneumonia. No pneumothorax.

Skeletal structures are grossly intact.
IMPRESSION: 1. Suspect interstitial edema superimposed on chronic interstitial
thickening/COPD. Possible small pleural effusions. No convincing
pneumonia.

## 2021-10-10 IMAGING — DX DG CHEST 1V PORT
1 series · 1 of 1 positions shown · non-contrast
Comparison: April 01, 2019

CLINICAL DATA: Status post fall.

EXAM:
PORTABLE CHEST 1 VIEW

[chest ap]
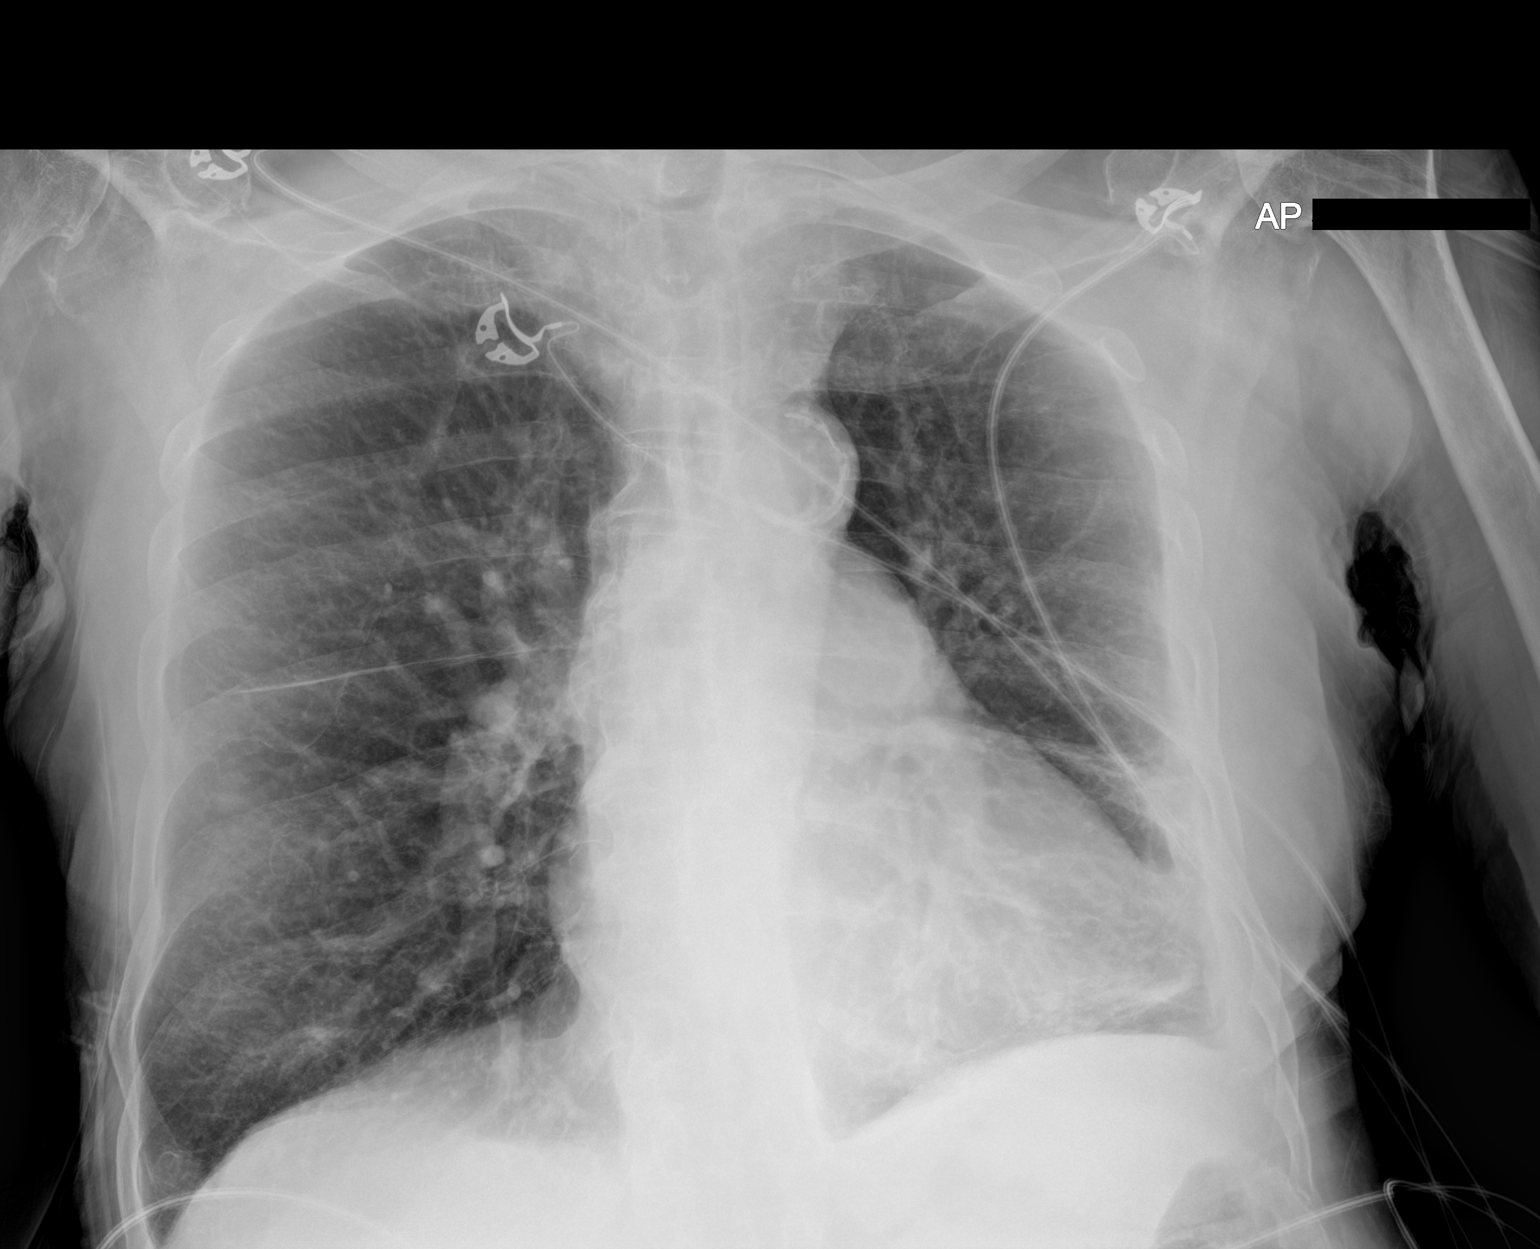

[1 of 1 positions shown; findings below may reference images not displayed]

FINDINGS: Mild to moderate severity diffuse chronic appearing increased
interstitial lung markings are seen. Mild, stable atelectasis is
seen along the lateral aspect of the left lung base. There is no
evidence of a pleural effusion or pneumothorax. The heart size and
mediastinal contours are within normal limits. There is marked
severity calcification of the aortic arch. Multilevel degenerative
changes are noted throughout the thoracic spine.
IMPRESSION: Chronic-appearing increased interstitial lung markings with mild,
stable left basilar atelectasis.

## 2022-02-13 IMAGING — CT CT CERVICAL SPINE W/O CM
3 of 4 series · 10 of 33 positions shown, 12 images · non-contrast
Comparison: 06/10/2016

CLINICAL DATA: Neck pain

EXAM:
CT CERVICAL SPINE WITHOUT CONTRAST
TECHNIQUE: Multidetector CT imaging of the cervical spine was performed without
intravenous contrast. Multiplanar CT image reconstructions were also
generated.

[Series 5: sagittal bone · sagittal · 0.25mm/px · 5 of 43 slices shown, 6 images]
[im 15/43  bone]
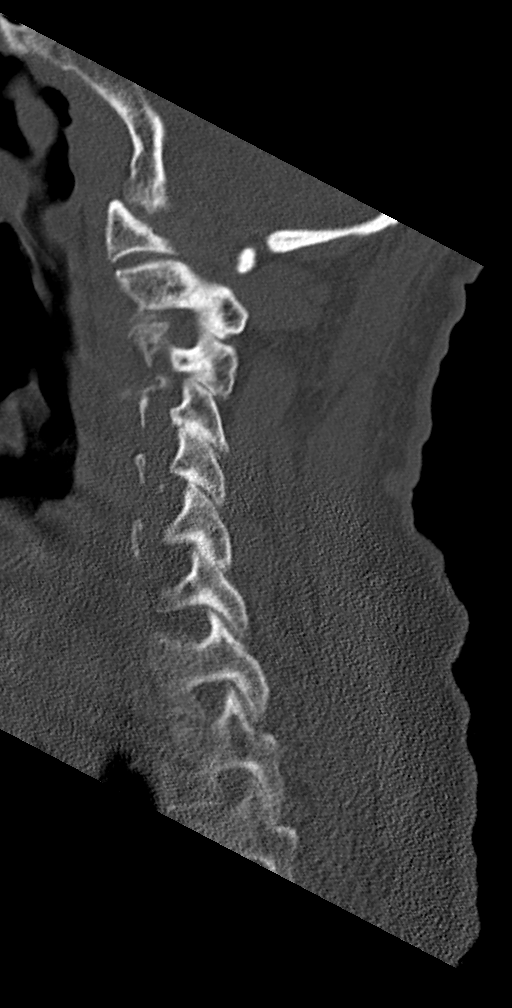
[im 18/43  bone]
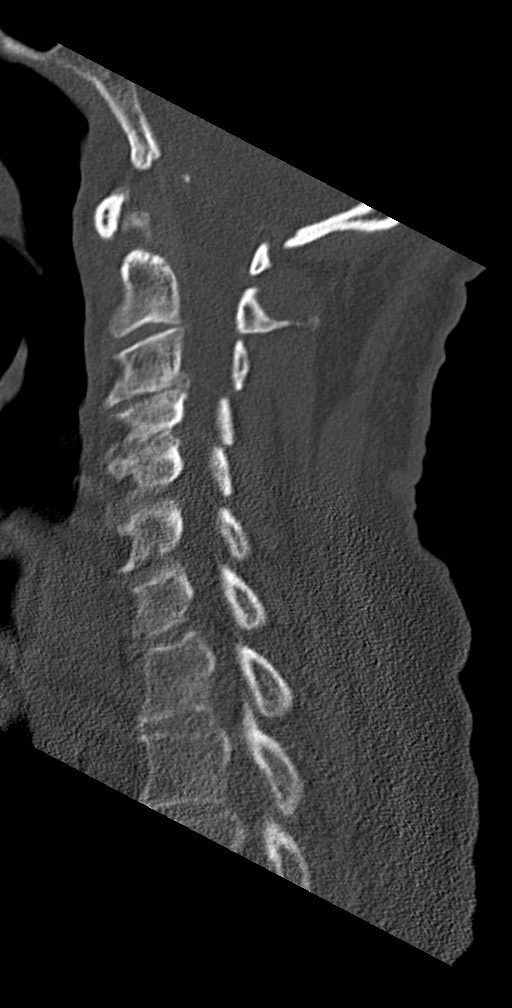
[im 22/43  soft-tissue]
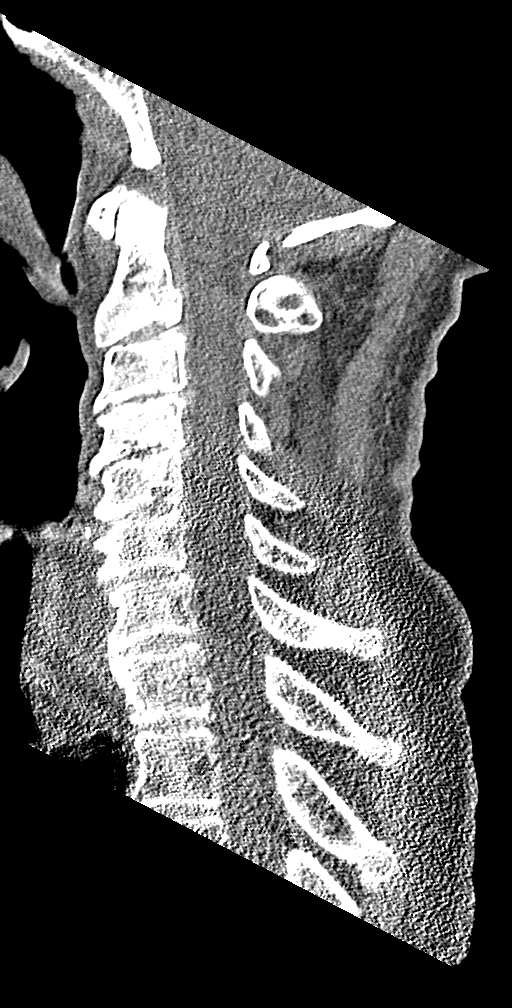
[im 22/43  bone]
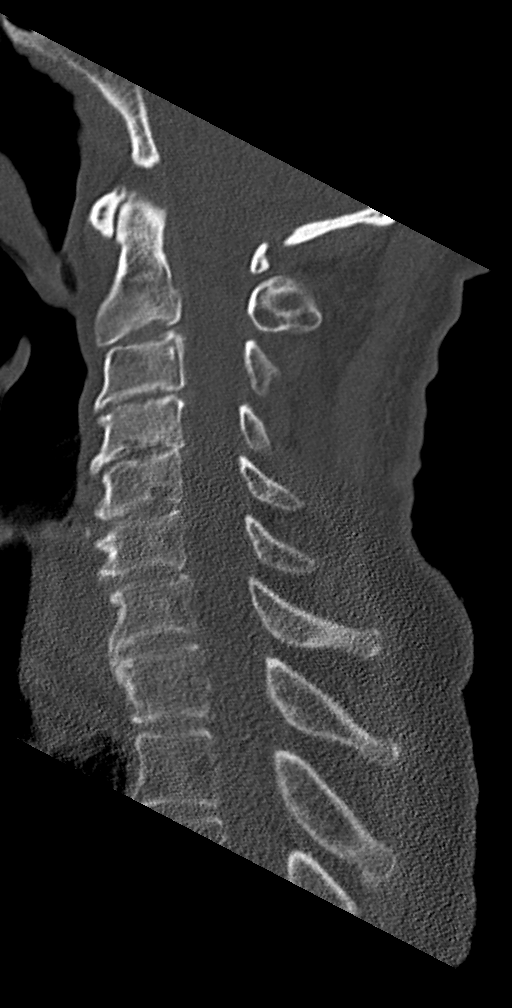
[im 25/43  bone]
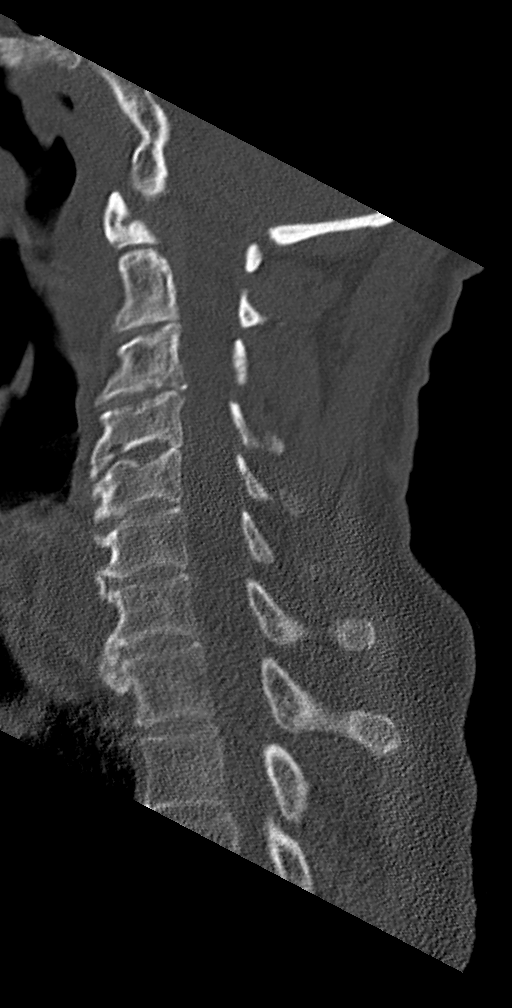
[im 29/43  bone]
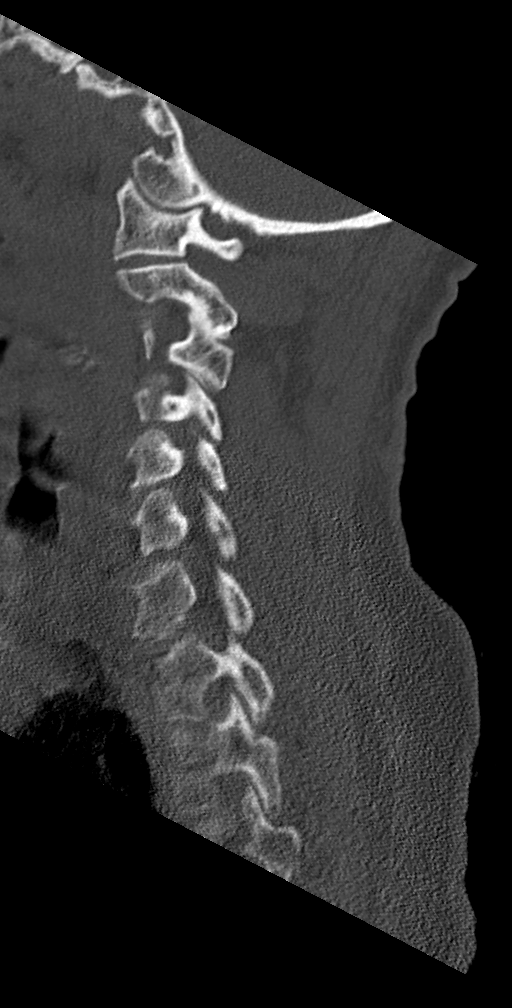

[Series 6: coronal bone · coronal · 0.23mm/px · 3 of 44 slices shown]
[im 9/44  bone]
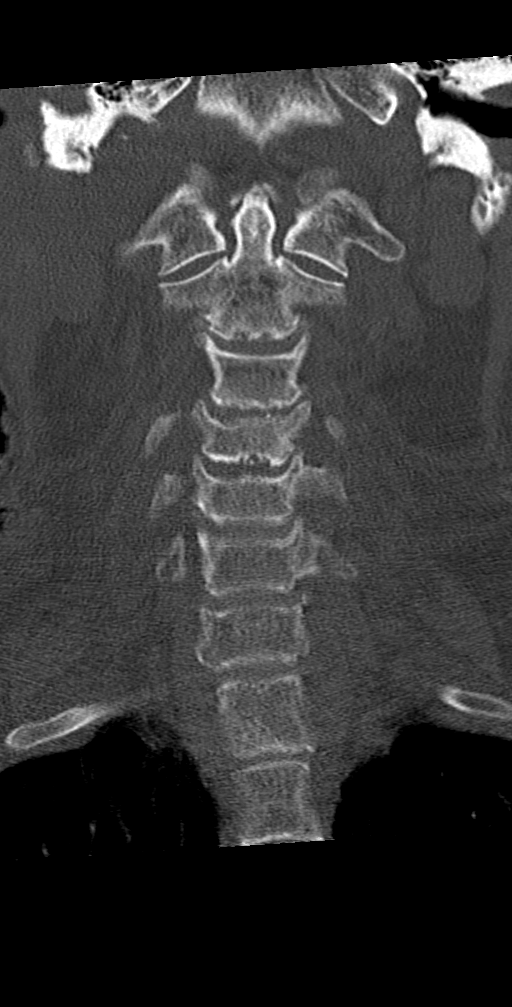
[im 18/44  bone]
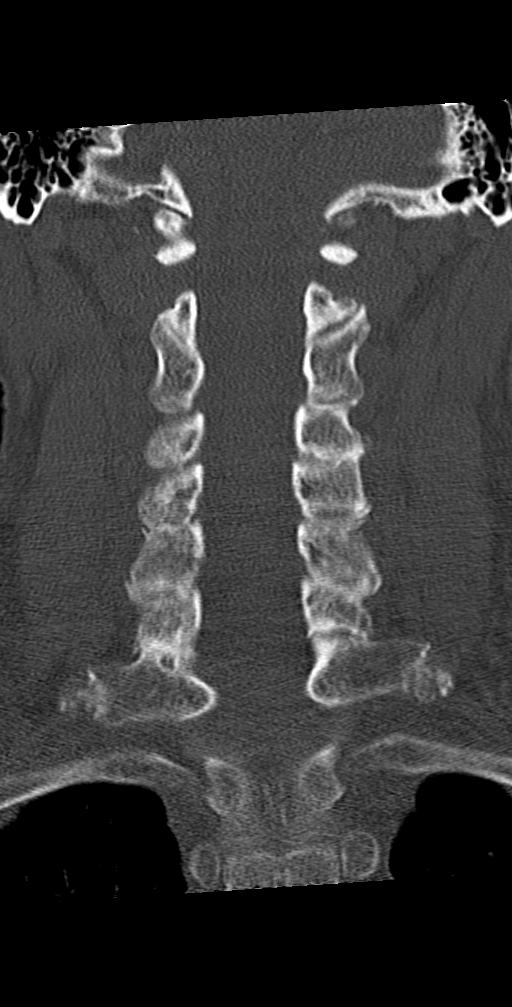
[im 26/44  bone]
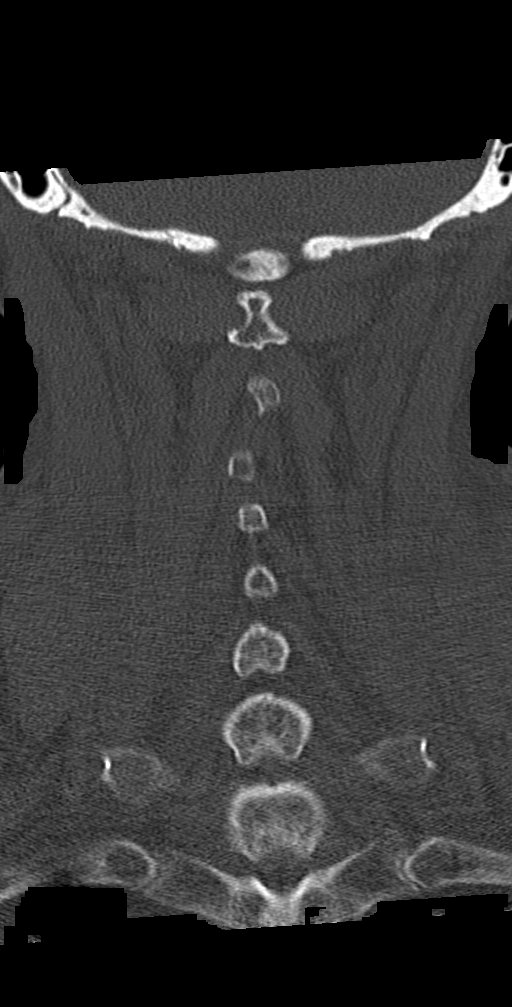

[Series 7: orthogonal bone · axial · 0.23mm/px · z∈[+78,+142]mm · 2 of 93 slices shown, 3 images]
[im 27/93  soft-tissue]
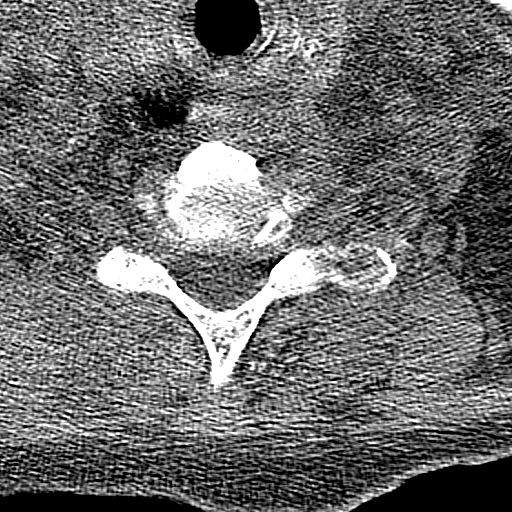
[im 27/93  bone]
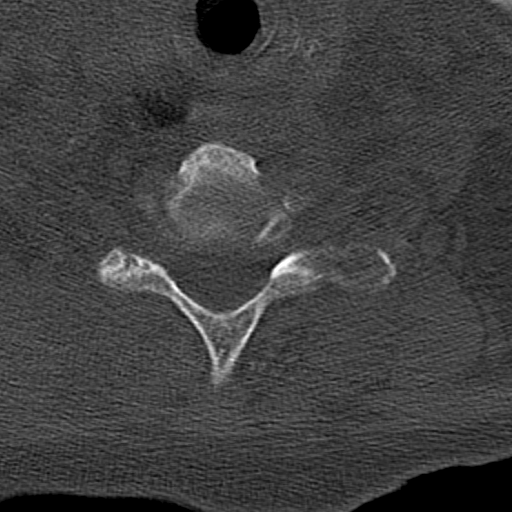
[im 66/93  bone]
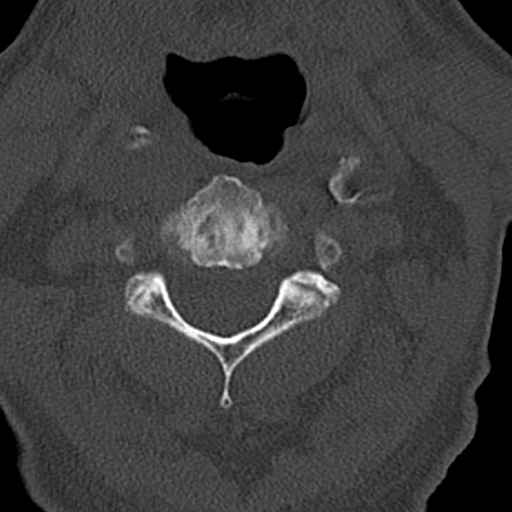

[10 of 33 positions shown; findings below may reference images not displayed]

FINDINGS: Alignment: Normal

Skull base and vertebrae: No acute fracture. No primary bone lesion
or focal pathologic process.

Soft tissues and spinal canal: No prevertebral fluid or swelling. No
visible canal hematoma.

Disc levels:  Diffuse degenerative disc disease and facet disease.

Upper chest: No acute findings

Other: None
IMPRESSION: Degenerative disc and facet disease.  No acute bony abnormality.

## 2022-02-13 IMAGING — DX DG CHEST 1V PORT
1 series · 1 of 1 positions shown · non-contrast
Comparison: Chest radiograph dated 06/25/2019.

CLINICAL DATA: 84-year-old male with fall.

EXAM:
PORTABLE CHEST 1 VIEW

[chest ap]
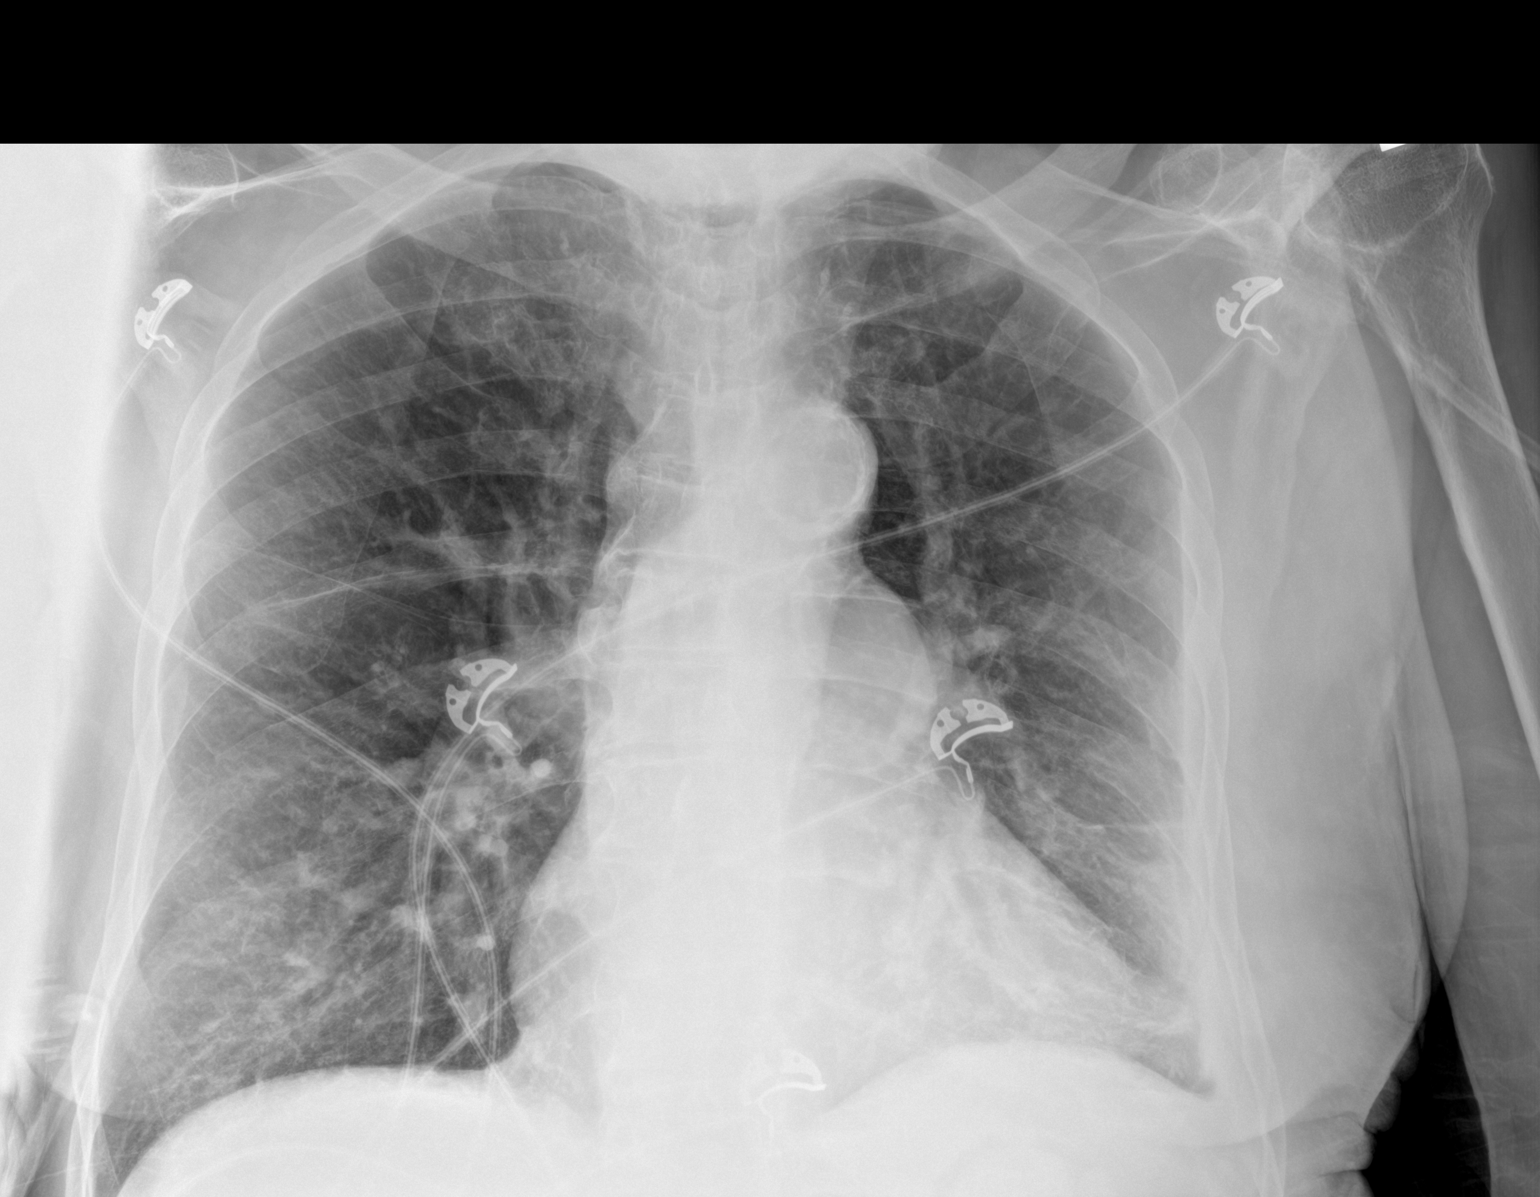

[1 of 1 positions shown; findings below may reference images not displayed]

FINDINGS: Background of emphysema. There is a small, likely chronic left
pleural effusion and left pleural thickening along the left lung
base. Right lung base reticulonodular densities appears similar to
prior radiograph, and likely chronic. No new consolidation. There is
no pneumothorax. Mild cardiomegaly. Atherosclerotic calcification of
the aorta. No acute osseous pathology.
IMPRESSION: 1. No acute cardiopulmonary process.  No interval change.
2. Emphysema and chronic left pleural thickening.
